# Patient Record
Sex: Male | Born: 1935 | Race: Black or African American | Hispanic: No | Marital: Married | Smoking: Former smoker
Health system: Southern US, Community
[De-identification: ages and names within clinical notes are randomized; demographics above are authoritative.]

## PROBLEM LIST (undated history)

## (undated) DIAGNOSIS — G4733 Obstructive sleep apnea (adult) (pediatric): Secondary | ICD-10-CM

## (undated) DIAGNOSIS — I639 Cerebral infarction, unspecified: Secondary | ICD-10-CM

## (undated) DIAGNOSIS — I10 Essential (primary) hypertension: Secondary | ICD-10-CM

## (undated) DIAGNOSIS — I739 Peripheral vascular disease, unspecified: Secondary | ICD-10-CM

## (undated) DIAGNOSIS — E785 Hyperlipidemia, unspecified: Secondary | ICD-10-CM

## (undated) DIAGNOSIS — C61 Malignant neoplasm of prostate: Secondary | ICD-10-CM

## (undated) DIAGNOSIS — R413 Other amnesia: Secondary | ICD-10-CM

## (undated) HISTORY — DX: Cerebral infarction, unspecified: I63.9

## (undated) HISTORY — DX: Hyperlipidemia, unspecified: E78.5

## (undated) HISTORY — DX: Other amnesia: R41.3

## (undated) HISTORY — DX: Malignant neoplasm of prostate: C61

## (undated) HISTORY — DX: Essential (primary) hypertension: I10

## (undated) HISTORY — PX: CHOLECYSTECTOMY: SHX55

## (undated) HISTORY — DX: Peripheral vascular disease, unspecified: I73.9

---
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
## 1992-02-03 HISTORY — PX: BACK SURGERY: SHX140

## 1998-07-09 ENCOUNTER — Ambulatory Visit (HOSPITAL_COMMUNITY): Admission: RE | Admit: 1998-07-09 | Discharge: 1998-07-09 | Payer: Self-pay | Admitting: Neurology

## 2000-05-20 ENCOUNTER — Encounter: Payer: Self-pay | Admitting: *Deleted

## 2000-05-20 ENCOUNTER — Encounter: Admission: RE | Admit: 2000-05-20 | Discharge: 2000-05-20 | Payer: Self-pay | Admitting: *Deleted

## 2000-05-28 ENCOUNTER — Encounter (HOSPITAL_BASED_OUTPATIENT_CLINIC_OR_DEPARTMENT_OTHER): Payer: Self-pay | Admitting: General Surgery

## 2000-06-01 ENCOUNTER — Ambulatory Visit (HOSPITAL_COMMUNITY): Admission: RE | Admit: 2000-06-01 | Discharge: 2000-06-02 | Payer: Self-pay | Admitting: General Surgery

## 2000-06-01 ENCOUNTER — Encounter (HOSPITAL_BASED_OUTPATIENT_CLINIC_OR_DEPARTMENT_OTHER): Payer: Self-pay | Admitting: General Surgery

## 2000-06-01 ENCOUNTER — Encounter (INDEPENDENT_AMBULATORY_CARE_PROVIDER_SITE_OTHER): Payer: Self-pay | Admitting: *Deleted

## 2003-08-04 ENCOUNTER — Emergency Department (HOSPITAL_COMMUNITY): Admission: EM | Admit: 2003-08-04 | Discharge: 2003-08-04 | Payer: Self-pay | Admitting: Emergency Medicine

## 2003-08-17 ENCOUNTER — Ambulatory Visit (HOSPITAL_COMMUNITY): Admission: RE | Admit: 2003-08-17 | Discharge: 2003-08-17 | Payer: Self-pay | Admitting: Internal Medicine

## 2003-09-04 ENCOUNTER — Ambulatory Visit (HOSPITAL_COMMUNITY): Admission: RE | Admit: 2003-09-04 | Discharge: 2003-09-04 | Payer: Self-pay | Admitting: Internal Medicine

## 2004-02-29 ENCOUNTER — Ambulatory Visit (HOSPITAL_COMMUNITY): Admission: RE | Admit: 2004-02-29 | Discharge: 2004-02-29 | Payer: Self-pay | Admitting: Internal Medicine

## 2004-04-30 ENCOUNTER — Ambulatory Visit (HOSPITAL_COMMUNITY): Admission: RE | Admit: 2004-04-30 | Discharge: 2004-04-30 | Payer: Self-pay | Admitting: Internal Medicine

## 2005-07-01 ENCOUNTER — Ambulatory Visit (HOSPITAL_COMMUNITY): Admission: RE | Admit: 2005-07-01 | Discharge: 2005-07-01 | Payer: Self-pay | Admitting: Internal Medicine

## 2006-01-11 ENCOUNTER — Ambulatory Visit (HOSPITAL_COMMUNITY): Admission: RE | Admit: 2006-01-11 | Discharge: 2006-01-11 | Payer: Self-pay | Admitting: Internal Medicine

## 2006-01-25 ENCOUNTER — Ambulatory Visit (HOSPITAL_COMMUNITY): Admission: RE | Admit: 2006-01-25 | Discharge: 2006-01-25 | Payer: Self-pay | Admitting: Internal Medicine

## 2006-09-01 ENCOUNTER — Inpatient Hospital Stay (HOSPITAL_COMMUNITY): Admission: EM | Admit: 2006-09-01 | Discharge: 2006-09-04 | Payer: Self-pay | Admitting: Emergency Medicine

## 2006-09-03 ENCOUNTER — Ambulatory Visit: Payer: Self-pay | Admitting: Cardiology

## 2006-09-16 ENCOUNTER — Ambulatory Visit (HOSPITAL_COMMUNITY): Admission: RE | Admit: 2006-09-16 | Discharge: 2006-09-16 | Payer: Self-pay | Admitting: Internal Medicine

## 2007-07-14 ENCOUNTER — Ambulatory Visit (HOSPITAL_COMMUNITY): Admission: RE | Admit: 2007-07-14 | Discharge: 2007-07-14 | Payer: Self-pay | Admitting: *Deleted

## 2007-08-17 ENCOUNTER — Encounter (INDEPENDENT_AMBULATORY_CARE_PROVIDER_SITE_OTHER): Payer: Self-pay | Admitting: Neurology

## 2007-08-17 ENCOUNTER — Ambulatory Visit: Payer: Self-pay

## 2008-04-22 ENCOUNTER — Emergency Department (HOSPITAL_COMMUNITY): Admission: EM | Admit: 2008-04-22 | Discharge: 2008-04-22 | Payer: Self-pay | Admitting: Emergency Medicine

## 2008-12-27 ENCOUNTER — Emergency Department (HOSPITAL_COMMUNITY): Admission: EM | Admit: 2008-12-27 | Discharge: 2008-12-27 | Payer: Self-pay | Admitting: Emergency Medicine

## 2009-06-12 ENCOUNTER — Emergency Department (HOSPITAL_COMMUNITY): Admission: EM | Admit: 2009-06-12 | Discharge: 2009-06-12 | Payer: Self-pay | Admitting: Emergency Medicine

## 2009-07-01 ENCOUNTER — Emergency Department (HOSPITAL_COMMUNITY): Admission: EM | Admit: 2009-07-01 | Discharge: 2009-07-01 | Payer: Self-pay | Admitting: Emergency Medicine

## 2010-04-01 ENCOUNTER — Encounter: Payer: Self-pay | Admitting: Cardiovascular Disease

## 2010-04-01 DIAGNOSIS — I739 Peripheral vascular disease, unspecified: Secondary | ICD-10-CM | POA: Insufficient documentation

## 2010-04-02 ENCOUNTER — Encounter: Payer: Self-pay | Admitting: Cardiovascular Disease

## 2010-04-02 ENCOUNTER — Encounter (INDEPENDENT_AMBULATORY_CARE_PROVIDER_SITE_OTHER): Payer: Medicare Other

## 2010-04-02 ENCOUNTER — Ambulatory Visit (INDEPENDENT_AMBULATORY_CARE_PROVIDER_SITE_OTHER): Payer: Medicare Other | Admitting: Cardiovascular Disease

## 2010-04-02 DIAGNOSIS — I70219 Atherosclerosis of native arteries of extremities with intermittent claudication, unspecified extremity: Secondary | ICD-10-CM

## 2010-04-02 DIAGNOSIS — I739 Peripheral vascular disease, unspecified: Secondary | ICD-10-CM

## 2010-04-03 ENCOUNTER — Encounter: Payer: Self-pay | Admitting: Cardiovascular Disease

## 2010-04-10 NOTE — Miscellaneous (Signed)
Summary: Orders Update  Clinical Lists Changes  Problems: Added new problem of UNSPECIFIED PERIPHERAL VASCULAR DISEASE (ICD-443.9) Orders: Added new Test order of Arterial Duplex Lower Extremity (Arterial Duplex Low) - Signed 

## 2010-04-14 DIAGNOSIS — I70219 Atherosclerosis of native arteries of extremities with intermittent claudication, unspecified extremity: Secondary | ICD-10-CM | POA: Insufficient documentation

## 2010-04-22 NOTE — Assessment & Plan Note (Signed)
Summary: New PV Consult/abnormal LEA   Visit Type:  Initial Consult  CC:  New PV evaluation per Dr. Leeanne Deed- .  History of Present Illness: This is a 75 year old gentleman referred for initial evaluation of lower extremity peripheral arterial disease. The patient sees Dr. Golden Pop for problems with his toenails, and he was noted to have an abnormal pulse exam. He was referred for ABIs and lower extremity duplex scanning performed earlier today. This demonstrated an ABI of 1.2 on the right and 0.86 on the left. TBI's were 0.95 on the right and 0.69 on the left. Duplex imaging demonstrated short segment occlusion of the left mid SFA.  The patient reports that his left leg "gives out." He describes a mild ache in his left knee and calf that resolves with rest. He is able to do a good bit of walking and really does not complain of much limitation. He denies rest pain or ulceration. He has no pain in his right leg. His left leg symptoms are long-standing and nonprogressive.  Current Medications (verified): 1)  Lisinopril 10 Mg Tabs (Lisinopril) .... Take One Tablet By Mouth Daily 2)  Simvastatin 40 Mg Tabs (Simvastatin) .... Take One Tablet By Mouth Daily At Bedtime 3)  Hydrocodone-Acetaminophen 5-500 Mg Tabs (Hydrocodone-Acetaminophen) .... As Needed 4)  Centrum Silver  Tabs (Multiple Vitamins-Minerals) .... Take 1 Tablet By Mouth Once A Day  Allergies (verified): No Known Drug Allergies  Past History:  Past medical, surgical, family and social histories (including risk factors) reviewed, and no changes noted (except as noted below).  Past Medical History: Hypertension Hyperlipidemia  Past Surgical History: Cervical spine surgery 1994 after a fall Cholecystecomy - remote.  Family History: Reviewed history and no changes required. CAD - Father, Mother, Brother - all deceased, no premature CAD Brother had a 'blood clot' in leg  No aneurysmal disease  Social History: Reviewed history  and no changes required. 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 No Etoh  Review of Systems       Negative except as per HPI   Vital Signs:  Patient profile:   75 year old male Height:      72 inches Weight:      173.75 pounds BMI:     23.65 Pulse rate:   60 / minute Pulse rhythm:   regular Resp:     18 per minute BP sitting:   114 / 84  (left arm) Cuff size:   large  Vitals Entered By: Vikki Ports (April 02, 2010 10:11 AM)  Serial Vital Signs/Assessments:  Time      Position  BP       Pulse  Resp  Temp     By           R Arm     120/80                         Vikki Ports   Physical Exam  General:  Pt is well-developed, alert and oriented, African American male in no acute distress HEENT: normal Neck: no thyromegaly           JVP normal, carotid upstrokes normal without bruits Lungs: CTA Chest: equal expansion  CV: Apical impulse nondisplaced, RRR without murmur or gallop Abd: soft, NT, positive BS, no HSM, no bruit Back: no CVA tenderness Ext: no clubbing, cyanosis, or edema        femoral pulses 2+  without bruits        posterior tibial pulses are nonpalpable bilaterally        Dorsalis pedis pulses 1+ bilaterally Skin: warm, dry, no rash Neuro: CNII-XII intact,strength 5/5 = b/l    Impression & Recommendations:  Problem # 1:  ATHEROSCLEROSIS W /INT CLAUDICATION (ICD-440.21) This is a 75 year old gentleman with mild, lifestyle limiting intermittent claudication. His symptoms are likely secondary to total occlusion of the left SFA with collaterals. His duplex scan raised the question of total occlusion versus a "string sign." I suspect the patient has total left SFA occlusion as his symptoms appear to be in a very stable pattern. With only a mildly reduced ABI and adequate toe brachial indices, I have recommended conservative therapy with risk reduction measures. We discussed antiplatelet therapy but the patient is  unable to take this as he has had severe epistaxis in the past with taking aspirin. He will continue on lisinopril and simvastatin. I would be happy to see him in the future if his leg symptoms worsen.  I appreciate the opportunity to evaluate this nice gentleman.  Patient Instructions: 1)  Your physician recommends that you schedule a follow-up appointment as needed.  2)  Your physician recommends that you continue on your current medications as directed. Please refer to the Current Medication list given to you today.

## 2010-05-15 LAB — DIFFERENTIAL
Basophils Absolute: 0.1 10*3/uL (ref 0.0–0.1)
Lymphocytes Relative: 17 % (ref 12–46)
Monocytes Absolute: 0.8 10*3/uL (ref 0.1–1.0)
Neutro Abs: 7.2 10*3/uL (ref 1.7–7.7)

## 2010-05-15 LAB — COMPREHENSIVE METABOLIC PANEL
ALT: 25 U/L (ref 0–53)
Calcium: 8.8 mg/dL (ref 8.4–10.5)
Creatinine, Ser: 0.85 mg/dL (ref 0.4–1.5)
GFR calc Af Amer: >60 mL/min (ref >60)
GFR calc non Af Amer: >60 mL/min (ref >60)
Glucose, Bld: 155 mg/dL — ABNORMAL HIGH (ref 70–99)
Total Protein: 6.6 g/dL (ref 6.0–8.3)

## 2010-05-15 LAB — CBC
HCT: 39.6 % (ref 39.0–52.0)
Hemoglobin: 13 g/dL (ref 13.0–17.0)
MCHC: 33 g/dL (ref 30.0–36.0)
Platelets: 203 10*3/uL (ref 150–400)
RDW: 14.1 % (ref 11.5–15.5)

## 2010-05-20 ENCOUNTER — Encounter: Payer: Self-pay | Admitting: Cardiovascular Disease

## 2010-05-21 ENCOUNTER — Ambulatory Visit (INDEPENDENT_AMBULATORY_CARE_PROVIDER_SITE_OTHER): Payer: Medicare Other | Admitting: Cardiovascular Disease

## 2010-05-21 ENCOUNTER — Encounter: Payer: Self-pay | Admitting: Cardiovascular Disease

## 2010-05-21 VITALS — BP 126/80 | HR 68 | Resp 18 | Ht 72.0 in | Wt 179.8 lb

## 2010-05-21 DIAGNOSIS — I70219 Atherosclerosis of native arteries of extremities with intermittent claudication, unspecified extremity: Secondary | ICD-10-CM

## 2010-05-21 NOTE — Patient Instructions (Signed)
Your physician wants you to follow-up in: 1 YEAR. You will receive a reminder letter in the mail two months in advance. If you don't receive a letter, please call our office to schedule the follow-up appointment.  Your physician has requested that you have an ankle brachial index (ABI) in 1 YEAR. During this test an ultrasound and blood pressure cuff are used to evaluate the arteries that supply the arms and legs with blood. Allow thirty minutes for this exam. There are no restrictions or special instructions.  Your physician recommends that you continue on your current medications as directed. Please refer to the Current Medication list given to you today.  

## 2010-05-21 NOTE — Progress Notes (Signed)
HPI:  Ricky [name] is a 75 year old gentleman returning for followup evaluation. He was recently evaluated for lower extremity peripheral arterial disease. The patient underwent a duplex scan and ABI evaluation because of an abnormal pulse exam. His ABIs were 1.2 on the right is 0.86 on the left. He was noted to have short segment occlusion of the left SFA with collaterals. Medical therapy was recommended.  The patient returns today because of concerns of her left leg weakness. He describes a "weakness" in his left knee. At times the knee will give out when the patient walks. This is not consistent and only occurs on an intermittent basis. Most days he is able to walk for long distances without problems. He specifically denies calf pain or tightness with walking. He denies ulcers or nonhealing wounds in the feet. He has no other complaints today.  Outpatient Encounter Prescriptions as of 05/21/2010  Medication Sig Dispense Refill  . lisinopril (PRINIVIL,ZESTRIL) 10 MG tablet Take 10 mg by mouth daily.        . Multiple Vitamin (MULTIVITAMIN) tablet Take 1 tablet by mouth daily.        . simvastatin (ZOCOR) 40 MG tablet Take 40 mg by mouth at bedtime.        Marland Kitchen HYDROcodone-acetaminophen (VICODIN) 5-500 MG per tablet Take 1 tablet by mouth every 6 (six) hours as needed.          No Known Allergies  Past Medical History  Diagnosis Date  . HTN (hypertension)   . HLD (hyperlipidemia)     ROS: Negative except as per HPI  BP 126/80  Pulse 68  Resp 18  Ht 6' (1.829 m)  Wt 179 lb 12.8 oz (81.557 kg)  BMI 24.39 kg/m2  PHYSICAL EXAM: Pt is alert and oriented, NAD HEENT: normal Neck: JVP - normal, carotids 2+= without bruits Lungs: CTA bilaterally CV: RRR without murmur or gallop Abd: soft, NT, Positive BS, no hepatomegaly Ext: no C/C/E, right DP pulses 2+, left DP pulses 1+. Posterior tibials are not palpable. Skin: warm/dry no rash  ASSESSMENT AND PLAN:

## 2010-05-21 NOTE — Assessment & Plan Note (Signed)
I reviewed the significance of PAD with the patient in detail. He has only mild impairment in arterial flow to the left leg based on an ABI of 86%. He has a palpable pulse in the left foot and really does not have typical symptoms of claudication. I think his left knee weakness is related to an orthopedic problem most likely. He is on appropriate medical therapy with the exception of no antiplatelet drug. He has not tolerated aspirin in the past because of epistaxis. He is willing to try every other day low dose aspirin and if he tolerates this increased to once daily. Otherwise he will continue on his current medical program and will followup in one year with repeat ABIs at that time.

## 2010-06-20 NOTE — Discharge Summary (Signed)
NAME:  Ricky [name], Ricky [name] NO.:  [id number]   MEDICAL RECORD NO.:  [id number]          PATIENT TYPE:  INP   LOCATION:  4742                         FACILITY:  [facility]   PHYSICIAN:  Margaretmary Bayley, M.D.    DATE OF BIRTH:  1935-04-09   DATE OF ADMISSION:  09/01/2006  DATE OF DISCHARGE:  09/04/2006                               DISCHARGE SUMMARY   DISCHARGE DIAGNOSES:  1. Syncope and collapse.  2. Hypotension secondary to recent diuresis.  3. Systemic hypertension.  4. Pure hypercholesterolemia.  5. Chronic cigarette abuse.  6. Chronic obstructive pulmonary disease.  7. Chronic bronchitis.  8. No evidence of acute exacerbation.   REASON FOR ADMISSION:  Ricky [name] is a 75 year old former 30-pack-year  cigarette smoker hypertensive gentleman who was admitted after  sustaining a syncopal episode.  The patient apparently was at  work and  apparently experienced 2-3 loose bowel movements, became dizzy, and fell  during the process of losing his sensorium.  Coworkers do not describe  any seizure-like activity and no incontinence.  He was found by the ER  staff and EMS to have a blood pressure of 82/50 and a pulse rate of 122  when initially evaluated.  He was started on IV fluid hydration  aggressively on his way to the emergency room and after having been  continued, he was admitted to the ER, and with these measures his  systolic blood pressure increased to about 95 with a pulse rate dropping  to 64.  There apparently was no associated chest pain or shortness of  breath.  Of importance in the patient's history is that he is on chronic  Prinivil 40 mg per day for his blood pressure along with Vytorin and on  a recent office visit to his cardiologist, he was apparently told to  increase his furosemide from 40 to 80 mg per day because at that time  his blood pressure was still slightly high.  The patient states that  over the two days subsequent to this change he had felt a  little bit  weak but had no dizziness and particularly denies any faintness or  wooziness in going from a lying or sitting position to a standing one.  There was no history of any chest pain either.   PERTINENT PHYSICAL FINDINGS:  VITAL SIGNS:  After having been bolused  with about a liter of IV fluids in the ER, his blood pressure was back  up to 110/70 lying, 105/68 sitting with his legs dangling, and a pulse  rate of 55 and 64 sitting with his legs dangling.  HEENT:  There was no obvious head trauma.  SKIN:  Warm and dry.  NECK:  Supple with no adenopathy, thyromegaly, or jugular venous  distention, and no carotid bruits.  LUNGS: There was no splinting, tenderness, or deformities.  His lungs  were clear.  CARDIAC EXAM:  He had a regular rhythm with a rate of about  50-64.  No gallops or rubs were noted.  ABDOMEN:  Soft, nontender; no organomegaly; no masses.   PERTINENT LAB DATA:  His white count is 9600 with a hemoglobin of 13 and  hematocrit of 39.  His electrolytes were all normal.  Glucose of 155 and  115.  Creatinine of 1.5.  BUN 18.  Initial CPK was 135 with troponin of  0.01 and his urine was completely clear.  His EKG revealed sinus rhythm  and changes that were suggestive of an old anteroseptal MI.  He had no  acute repolarization abnormalities.   HOSPITAL COURSE:  The patient was admitted with a working diagnosis of  syncope, hypotension possibly related to diarrhea and a recent increase  in his diuretic therapy.  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  His Prinivil was discontinued.  His  diuretics discontinued.  He continued to receive parenteral IV fluids  and half-normal saline at 100-120 mL/hr and his orthostatic changes in  blood pressure and pulse subsequently resolved over the first 24 hours.  Subsequent stools for occult blood and serial CBCs revealed  no evidence  suggesting significant blood loss or drop in his hemogram.  He developed  chest pain and serial enzymes and EKGs were all negative for an acute  myocardial infarction.  A cortisol level was 13.5.  He had normal  thyroid function studies.  Serial EKGs revealed no evolutionary changes  to suggest an ongoing acute cardiopulmonary event.  An MRI of his brain  revealed a chronic right middle cerebral artery infarct however there  were no changes to suggest that this was an acute event.  Chest x-ray  was completely clear; there was no evidence of cardiomegaly, and a 2-D  echocardiogram revealed no evidence of valvular heart disease and  specifically no evidence of aortic stenosis.   The patient's activity was progressively and gradually advanced with the  assistance of the rehab service and physical therapy.  The patient was  discharged home after being instructed on stopping his Prinivil and  furosemide.  He was advised to resume his aspirin, Vytorin, and the use  of Vicodin p.r.n. for his lower back pain.  The patient is scheduled to  be seen at [facility] in 1 week and he will be seen back in my  office in 2 weeks.  He was discharged home on a 3-gm sodium modified fat-  restricted diet on Vytorin 10/40 once daily, Ecotrin 81 mg per day, and  Vicodin p.r.n. back pain.  The patient was advised not to discard his  Prinivil or his furosemide as they probably be needed  at a lower dose.      Margaretmary Bayley, M.D.  Electronically Signed     PC/MEDQ  D:  10/07/2006  T:  10/07/2006  Job:  [id number]

## 2010-06-20 NOTE — Op Note (Signed)
[name]  Patient:    Ricky, [name]                           MRN: [id number] Proc. Date: 06/01/00 Adm. Date:  [id number] Attending:  Fortino Sic CC:         Heather Roberts, M.D.   Operative Report  PREOPERATIVE DIAGNOSIS:  Symptomatic cholelithiasis.  POSTOPERATIVE DIAGNOSIS:  Symptomatic cholelithiasis.  OPERATION PERFORMED:  Laparoscopic cholecystectomy with intraoperative cholangiogram.  SURGEON:  Marnee Spring. Wiliam Ke, M.D.  Threasa HeadsMarland Kitchen  Velna Hatchet.  ANESTHESIA:  Endotracheal by hospital.  DESCRIPTION OF PROCEDURE:  Under good endotracheal anesthesia, the skin of the abdomen was prepped and draped in the usual manner.  Tissue was infiltrated with Marcaine anesthesia prior to incision.  A curvilinear incision was made above the umbilicus.  The abdomen was entered.  A pursestring suture was placed.  Hasson cannula was inserted.  Pneumoperitoneum was obtained.  The peritoneal surfaces appeared normal.  A 10 mm and two 5 mm cannulas were placed in the usual fashion.  Dissecting near the porta hepatis, the cystic duct gallbladder junction was identified.  The cystic duct was opened, Reddick catheter was inserted and cholangiogram was performed using a fluoroscopic technique.  This was normal.  The cystic duct was triply clipped and cut leaving two clips proximally  The cystic artery was triply clipped and cut leaving two clips proximally.  The gallbladder was removed from the liver bed with an electrocautery current and hemostasis was obtained with electrocautery current.  The gallbladder was removed in an Endo pouch via the umbilical port. The right upper quadrant was copiously irrigated with saline and sucked dry. The cannula was removed.  Pursestring suture was tied.  The skin wounds were closed with subcuticular 4-0 Dexon and Steri-Strips were applied.  Estimated blood loss for this procedure was minimal.  The patient received no blood and left  the operating room in satisfactory condition after sponge and needle counts were verified. DD:  06/01/00 TD:  06/01/00 Job: [id number]

## 2010-11-17 LAB — BASIC METABOLIC PANEL
CO2: 24
Chloride: 109
Glucose, Bld: 104 — ABNORMAL HIGH
Potassium: 4.2
Sodium: 139

## 2010-11-17 LAB — COMPREHENSIVE METABOLIC PANEL
ALT: 12
ALT: 17
AST: 19
AST: 20
Albumin: 3.5
Alkaline Phosphatase: 72
Alkaline Phosphatase: 73
BUN: 15
CO2: 25
Calcium: 9.2
Chloride: 108
Chloride: 109
GFR calc Af Amer: 56 — ABNORMAL LOW
GFR calc Af Amer: >60
GFR calc non Af Amer: 46 — ABNORMAL LOW
Glucose, Bld: 115 — ABNORMAL HIGH
Potassium: 4.1
Sodium: 138
Sodium: 139
Total Bilirubin: 0.5
Total Bilirubin: 0.6
Total Protein: 6.4

## 2010-11-17 LAB — DIFFERENTIAL
Eosinophils Relative: 0
Lymphocytes Relative: 13
Lymphs Abs: 1.7
Monocytes Absolute: 0.6
Monocytes Relative: 5

## 2010-11-17 LAB — CBC
HCT: 43.4
Hemoglobin: 13.1
Hemoglobin: 14.4
MCHC: 33.3
MCV: 84
Platelets: 177
RBC: 4.62
RBC: 5.12
RDW: 15.1 — ABNORMAL HIGH
RDW: 15.2 — ABNORMAL HIGH
WBC: 7.4
WBC: 12.6 — ABNORMAL HIGH
WBC: 9.6

## 2010-11-17 LAB — URINALYSIS, MICROSCOPIC ONLY
Nitrite: NEGATIVE
Protein, ur: NEGATIVE
Specific Gravity, Urine: 1.018
Urobilinogen, UA: 0.2

## 2010-11-17 LAB — POCT I-STAT CREATININE
Creatinine, Ser: 2.7 — ABNORMAL HIGH
Operator id: 277751

## 2010-11-17 LAB — I-STAT 8, (EC8 V) (CONVERTED LAB)
BUN: 21
Chloride: 111
Glucose, Bld: 155 — ABNORMAL HIGH
pCO2, Ven: 42.8 — ABNORMAL LOW
pH, Ven: 7.351 — ABNORMAL HIGH

## 2010-11-17 LAB — CARDIAC PANEL(CRET KIN+CKTOT+MB+TROPI)
CK, MB: 3.7
CK, MB: 3.7
Relative Index: 2.9 — ABNORMAL HIGH

## 2010-11-17 LAB — CLOSTRIDIUM DIFFICILE EIA

## 2010-11-17 LAB — TSH: TSH: 4.153

## 2010-11-17 LAB — CORTISOL: Cortisol, Plasma: 13.5

## 2010-11-17 LAB — OCCULT BLOOD X 1 CARD TO LAB, STOOL: Fecal Occult Bld: NEGATIVE

## 2010-12-10 ENCOUNTER — Emergency Department (HOSPITAL_COMMUNITY): Payer: Medicare Other

## 2010-12-10 ENCOUNTER — Emergency Department (HOSPITAL_COMMUNITY)
Admission: EM | Admit: 2010-12-10 | Discharge: 2010-12-10 | Disposition: A | Discharge status: Home / Self Care | Payer: Medicare Other | Attending: Emergency Medicine | Admitting: Emergency Medicine

## 2010-12-10 ENCOUNTER — Encounter (HOSPITAL_COMMUNITY): Payer: Self-pay | Admitting: *Deleted

## 2010-12-10 DIAGNOSIS — R109 Unspecified abdominal pain: Secondary | ICD-10-CM | POA: Insufficient documentation

## 2010-12-10 DIAGNOSIS — R059 Cough, unspecified: Secondary | ICD-10-CM | POA: Insufficient documentation

## 2010-12-10 DIAGNOSIS — R05 Cough: Secondary | ICD-10-CM | POA: Insufficient documentation

## 2010-12-10 DIAGNOSIS — R509 Fever, unspecified: Secondary | ICD-10-CM | POA: Insufficient documentation

## 2010-12-10 DIAGNOSIS — N419 Inflammatory disease of prostate, unspecified: Secondary | ICD-10-CM

## 2010-12-10 DIAGNOSIS — I10 Essential (primary) hypertension: Secondary | ICD-10-CM | POA: Insufficient documentation

## 2010-12-10 DIAGNOSIS — Z8546 Personal history of malignant neoplasm of prostate: Secondary | ICD-10-CM | POA: Insufficient documentation

## 2010-12-10 LAB — BASIC METABOLIC PANEL
Chloride: 99 mEq/L (ref 96–112)
GFR calc Af Amer: 61 mL/min — ABNORMAL LOW (ref >90)
GFR calc non Af Amer: 52 mL/min — ABNORMAL LOW (ref >90)
Potassium: 3.4 mEq/L — ABNORMAL LOW (ref 3.5–5.1)
Sodium: 136 mEq/L (ref 135–145)

## 2010-12-10 LAB — DIFFERENTIAL
Basophils Relative: 1 % (ref 0–1)
Eosinophils Absolute: 0.1 10*3/uL (ref 0.0–0.7)
Eosinophils Relative: 2 % (ref 0–5)
Lymphs Abs: 1.2 10*3/uL (ref 0.7–4.0)
Neutrophils Relative %: 73 % (ref 43–77)

## 2010-12-10 LAB — CBC
MCH: 28 pg (ref 26.0–34.0)
MCHC: 35.9 g/dL (ref 30.0–36.0)
MCV: 78.8 fL (ref 78.0–100.0)
Platelets: 126 10*3/uL — ABNORMAL LOW (ref 150–400)
RBC: 4.85 MIL/uL (ref 4.22–5.81)

## 2010-12-10 LAB — URINE MICROSCOPIC-ADD ON

## 2010-12-10 LAB — URINALYSIS, ROUTINE W REFLEX MICROSCOPIC
Glucose, UA: 250 mg/dL — AB
Hgb urine dipstick: NEGATIVE
Leukocytes, UA: NEGATIVE
Protein, ur: 100 mg/dL — AB
Urobilinogen, UA: 1 mg/dL (ref 0.0–1.0)

## 2010-12-10 MED ORDER — HYDROCODONE-ACETAMINOPHEN 5-325 MG PO TABS: 2.0000 | ORAL_TABLET | ORAL | Status: AC | PRN

## 2010-12-10 MED ORDER — CIPROFLOXACIN HCL 500 MG PO TABS
500.0000 mg | ORAL_TABLET | Freq: Once | ORAL | Status: AC
  Administered 2010-12-10: 500 mg via ORAL
  Filled 2010-12-10: qty 1

## 2010-12-10 MED ORDER — CIPROFLOXACIN HCL 500 MG PO TABS: 500.0000 mg | ORAL_TABLET | Freq: Two times a day (BID) | ORAL | Status: AC

## 2010-12-10 NOTE — ED Provider Notes (Signed)
History     CSN: [id number] Arrival date & time: 12/10/2010  1:12 PM   First MD Initiated Contact with Patient 12/10/10 1627      Chief Complaint  Patient presents with  . Abdominal Pain    fever 101.5, sent from Dr. Chestine Spore for EDP to see     HPI  Patient presents to emergency room with 3 four-day history of chills and lower dominant all discomfort.  Discomfort is worse when he coughs.  He denies productive cough or difficulty breathing.  He denies vomiting, diarrhea.  Patient went to his primary medical doctor Ray was found to have a temperature of 101.  Placenta the ED for evaluation.  Patient has known prostate problems.  Patient denies loss of appetite.  Patient denies recent weight loss.  Past Medical History  Diagnosis Date  . HTN (hypertension)   . HLD (hyperlipidemia)   . Cancer     prostate    Past Surgical History  Procedure Date  . Cholecystectomy   . Back surgery 1994    cervical    Family History  Problem Relation Age of Onset  . Coronary artery disease      History  Substance Use Topics  . Smoking status: Former Smoker    Quit date: 02/03/1992  . Smokeless tobacco: Not on file  . Alcohol Use: No      Review of Systems  Constitutional: Positive for fever.  Respiratory: Positive for cough.   Gastrointestinal: Negative for abdominal distention.  Genitourinary: Negative for urgency.  All other systems reviewed and are negative.    Allergies  Review of patient's allergies indicates no known allergies.  Home Medications   Current Outpatient Rx  Name Route Sig Dispense Refill  . HYDROCODONE-ACETAMINOPHEN 5-500 MG PO TABS Oral Take 1 tablet by mouth every 6 (six) hours as needed.      Marland Kitchen LISINOPRIL 10 MG PO TABS Oral Take 10 mg by mouth daily.      Marland Kitchen ONE-DAILY MULTI VITAMINS PO TABS Oral Take 1 tablet by mouth daily.      Marland Kitchen SIMVASTATIN 40 MG PO TABS Oral Take 40 mg by mouth at bedtime.        BP 102/64  Pulse 120  Temp(Src) 99.4 F (37.4 C)  (Oral)  Resp 18  Wt 194 lb (87.998 kg)  SpO2 94%  Physical Exam  Constitutional: He appears well-developed and well-nourished. No distress.  HENT:  Head: Normocephalic and atraumatic.  Cardiovascular: Normal rate.   Abdominal: Soft. There is no tenderness. There is no rebound and no guarding.  Genitourinary: Rectal exam shows anal tone normal. Prostate is not enlarged and not tender.  Musculoskeletal: Normal range of motion.  Skin: Skin is warm.  Psychiatric: He has a normal mood and affect.    ED Course  Procedures (including critical care time)   Labs Reviewed  CBC - Abnormal; Notable for the following:    Hemoglobin 12.5 (*)    HCT 38.2 (*)    Platelets 126 (*)    All other components within normal limits  URINALYSIS, ROUTINE W REFLEX MICROSCOPIC - Abnormal; Notable for the following:    Color, Urine ORANGE (*) BIOCHEMICALS MAY BE AFFECTED BY COLOR   Appearance CLOUDY (*)    Specific Gravity, Urine 1.038 (*)    Glucose, UA 250 (*)    Bilirubin Urine SMALL (*)    Ketones, ur TRACE (*)    Protein, ur 100 (*)    All other components within  normal limits  BASIC METABOLIC PANEL - Abnormal; Notable for the following:    Potassium 3.4 (*)    Glucose, Bld 140 (*)    GFR calc non Af Amer 52 (*)    GFR calc Af Amer 61 (*)    All other components within normal limits  URINE MICROSCOPIC-ADD ON - Abnormal; Notable for the following:    Bacteria, UA FEW (*)    All other components within normal limits  DIFFERENTIAL  URINE CULTURE     Labs Reviewed  CBC  DIFFERENTIAL  URINALYSIS, ROUTINE W REFLEX MICROSCOPIC  URINE CULTURE  BASIC METABOLIC PANEL   No results found.   No diagnosis found.    MDM          Nelia Shi, MD 12/10/10 2020

## 2010-12-10 NOTE — ED Notes (Signed)
Pt went to Dr. Ophelia Charter office today & was sent here; pt states "when I cough & take a deep breath my stomach hurts worse"

## 2010-12-10 NOTE — ED Notes (Signed)
Pt sitting up in chair; states abd pain is some better; c/o neck pain from old injury; feels chilled; requesting to be discharged; dressed and ready to go

## 2010-12-10 NOTE — ED Notes (Signed)
[id number]<QI> Expected date:12/10/10<BR> Expected time: 4:04 PM<BR> Means of arrival:Ambulance<BR> Comments:<BR> EMS 61 GC - fatigue

## 2010-12-11 LAB — URINE CULTURE
Colony Count: NO GROWTH
Culture: NO GROWTH

## 2010-12-15 ENCOUNTER — Encounter (HOSPITAL_COMMUNITY): Payer: Self-pay

## 2010-12-15 ENCOUNTER — Emergency Department (HOSPITAL_COMMUNITY)
Admission: EM | Admit: 2010-12-15 | Discharge: 2010-12-15 | Disposition: A | Discharge status: Home / Self Care | Payer: Medicare Other | Attending: Emergency Medicine | Admitting: Emergency Medicine

## 2010-12-15 DIAGNOSIS — R109 Unspecified abdominal pain: Secondary | ICD-10-CM | POA: Insufficient documentation

## 2010-12-15 DIAGNOSIS — N289 Disorder of kidney and ureter, unspecified: Secondary | ICD-10-CM | POA: Insufficient documentation

## 2010-12-15 DIAGNOSIS — R319 Hematuria, unspecified: Secondary | ICD-10-CM | POA: Insufficient documentation

## 2010-12-15 DIAGNOSIS — R350 Frequency of micturition: Secondary | ICD-10-CM | POA: Insufficient documentation

## 2010-12-15 DIAGNOSIS — R300 Dysuria: Secondary | ICD-10-CM | POA: Insufficient documentation

## 2010-12-15 DIAGNOSIS — Z79899 Other long term (current) drug therapy: Secondary | ICD-10-CM | POA: Insufficient documentation

## 2010-12-15 DIAGNOSIS — N390 Urinary tract infection, site not specified: Secondary | ICD-10-CM

## 2010-12-15 DIAGNOSIS — Z8546 Personal history of malignant neoplasm of prostate: Secondary | ICD-10-CM | POA: Insufficient documentation

## 2010-12-15 DIAGNOSIS — E785 Hyperlipidemia, unspecified: Secondary | ICD-10-CM | POA: Insufficient documentation

## 2010-12-15 DIAGNOSIS — I10 Essential (primary) hypertension: Secondary | ICD-10-CM | POA: Insufficient documentation

## 2010-12-15 LAB — CBC
HCT: 35.5 % — ABNORMAL LOW (ref 39.0–52.0)
MCH: 28.4 pg (ref 26.0–34.0)
MCHC: 36.9 g/dL — ABNORMAL HIGH (ref 30.0–36.0)
MCV: 77 fL — ABNORMAL LOW (ref 78.0–100.0)
RDW: 13.8 % (ref 11.5–15.5)

## 2010-12-15 LAB — URINALYSIS, ROUTINE W REFLEX MICROSCOPIC
Glucose, UA: NEGATIVE mg/dL
Leukocytes, UA: NEGATIVE
Protein, ur: 100 mg/dL — AB
pH: 5 (ref 5.0–8.0)

## 2010-12-15 LAB — URINE MICROSCOPIC-ADD ON

## 2010-12-15 LAB — POCT I-STAT, CHEM 8
BUN: 28 mg/dL — ABNORMAL HIGH (ref 6–23)
Calcium, Ion: 1.06 mmol/L — ABNORMAL LOW (ref 1.12–1.32)
Chloride: 100 mEq/L (ref 96–112)
Glucose, Bld: 194 mg/dL — ABNORMAL HIGH (ref 70–99)
HCT: 38 % — ABNORMAL LOW (ref 39.0–52.0)

## 2010-12-15 MED ORDER — CEPHALEXIN 500 MG PO CAPS: 500.0000 mg | ORAL_CAPSULE | Freq: Four times a day (QID) | ORAL | Status: AC

## 2010-12-15 MED ORDER — CEPHALEXIN 250 MG PO CAPS
500.0000 mg | ORAL_CAPSULE | Freq: Once | ORAL | Status: AC
  Administered 2010-12-15: 500 mg via ORAL
  Filled 2010-12-15: qty 2

## 2010-12-15 NOTE — ED Notes (Signed)
Patient presents with dysuria, hematuria and frequency x 1 year. Patient seen for same on Friday and has not followed up at PCP office.

## 2010-12-15 NOTE — ED Provider Notes (Signed)
History     CSN: [id number] Arrival date & time: 12/15/2010  8:19 AM   First MD Initiated Contact with Patient 12/15/10 [phone]      Chief Complaint  Patient presents with  . Dysuria  HPI Patient seen and evaluated at 8:40 am. Patient reports having dysuria, hematuria, polyuria for the past week. Patient's past medical history significant for prostate cancer. Reports he is followed by a doctor Nissen. Patient primary care physician is Dr. Margaretmary Bayley. Denies having seen him in the hospice after his ER visit one week ago. No growth on urine culture at that time.  Past Medical History  Diagnosis Date  . HTN (hypertension)   . HLD (hyperlipidemia)   . Cancer     prostate    Past Surgical History  Procedure Date  . Cholecystectomy   . Back surgery 1994    cervical    Family History  Problem Relation Age of Onset  . Coronary artery disease      History  Substance Use Topics  . Smoking status: Former Smoker    Quit date: 02/03/1992  . Smokeless tobacco: Not on file  . Alcohol Use: No      Review of Systems  Constitutional: Negative for fever, chills, diaphoresis and appetite change.  HENT: Negative for neck pain.   Eyes: Negative for photophobia and visual disturbance.  Respiratory: Negative for cough, chest tightness and shortness of breath.   Cardiovascular: Negative for chest pain.  Gastrointestinal: Negative for nausea, vomiting and abdominal pain.  Genitourinary: Positive for dysuria, frequency, hematuria and flank pain. Negative for decreased urine volume, discharge, penile pain and testicular pain.  Musculoskeletal: Negative for back pain.  Skin: Negative for rash.  Neurological: Negative for weakness and numbness.  All other systems reviewed and are negative.    Allergies  Review of patient's allergies indicates no known allergies.  Home Medications   Current Outpatient Rx  Name Route Sig Dispense Refill  . LISINOPRIL 10 MG PO TABS Oral Take 10 mg by  mouth daily.      Marland Kitchen ONE-DAILY MULTI VITAMINS PO TABS Oral Take 1 tablet by mouth daily.      Marland Kitchen SIMVASTATIN 40 MG PO TABS Oral Take 40 mg by mouth 2 (two) times daily.     Marland Kitchen CIPROFLOXACIN HCL 500 MG PO TABS Oral Take 1 tablet (500 mg total) by mouth every 12 (twelve) hours. 10 tablet 0  . HYDROCODONE-ACETAMINOPHEN 5-325 MG PO TABS Oral Take 2 tablets by mouth every 4 (four) hours as needed for pain. 6 tablet 0    BP 126/70  Pulse 96  Temp(Src) 97.7 F (36.5 C) (Oral)  Resp 14  SpO2 100%  Physical Exam  Nursing note and vitals reviewed. Constitutional: He appears well-developed and well-nourished.  HENT:  Head: Normocephalic and atraumatic.  Eyes: EOM are normal. Pupils are equal, round, and reactive to light.  Neck: Normal range of motion. Neck supple.  Cardiovascular: Normal rate, regular rhythm, normal heart sounds and intact distal pulses.  Exam reveals no gallop and no friction rub.   No murmur heard. Abdominal: Soft. Bowel sounds are normal. He exhibits no mass. There is no tenderness. There is no rebound and no guarding.  Genitourinary:       Right flank pain  Musculoskeletal: Normal range of motion.  Skin: Skin is warm and dry.  Psychiatric: He has a normal mood and affect. His behavior is normal. Judgment and thought content normal.    ED Course  Procedures (including critical care time)  Patient seen and evaluated.  VSS reviewed. . Nursing notes reviewed. Discussed with and seen with Dr. Golda Acre, attending physician. Denied that any imaging is needed at this time.  Initial testing ordered. Will monitor the patient closely. They agree with the treatment plan and diagnosis.   10:24 AM Patient seen and re-evaluated. Resting comfortably. VSS stable. NAD. Patient notified of testing results. Stated agreement and understanding. Patient stated understanding to treatment plan and diagnosis.  Patient Vitals for the past 24 hrs:  BP Temp Temp src Pulse Resp SpO2  12/15/10 0823  126/70 mmHg 97.7 F (36.5 C) Oral 96  14  100 %     Results for orders placed during the hospital encounter of 12/15/10  URINALYSIS, ROUTINE W REFLEX MICROSCOPIC      Component Value Range   Color, Urine AMBER (*) YELLOW    Appearance CLOUDY (*) CLEAR    Specific Gravity, Urine 1.024  1.005 - 1.030    pH 5.0  5.0 - 8.0    Glucose, UA NEGATIVE  NEGATIVE (mg/dL)   Hgb urine dipstick MODERATE (*) NEGATIVE    Bilirubin Urine SMALL (*) NEGATIVE    Ketones, ur 15 (*) NEGATIVE (mg/dL)   Protein, ur 161 (*) NEGATIVE (mg/dL)   Urobilinogen, UA 1.0  0.0 - 1.0 (mg/dL)   Nitrite NEGATIVE  NEGATIVE    Leukocytes, UA NEGATIVE  NEGATIVE   URINE MICROSCOPIC-ADD ON      Component Value Range   Squamous Epithelial / LPF RARE  RARE    WBC, UA 0-2  <3 (WBC/hpf)   Bacteria, UA RARE  RARE    Casts HYALINE CASTS (*) NEGATIVE    Urine-Other MUCOUS PRESENT    POCT I-STAT, CHEM 8      Component Value Range   Sodium 134 (*) 135 - 145 (mEq/L)   Potassium 4.1  3.5 - 5.1 (mEq/L)   Chloride 100  96 - 112 (mEq/L)   BUN 28 (*) 6 - 23 (mg/dL)   Creatinine, Ser 0.96 (*) 0.50 - 1.35 (mg/dL)   Glucose, Bld 045 (*) 70 - 99 (mg/dL)   Calcium, Ion 4.09 (*) 1.12 - 1.32 (mmol/L)   TCO2 24  0 - 100 (mmol/L)   Hemoglobin 12.9 (*) 13.0 - 17.0 (g/dL)   HCT 81.1 (*) 91.4 - 52.0 (%)  CBC      Component Value Range   WBC 10.5  4.0 - 10.5 (K/uL)   RBC 4.61  4.22 - 5.81 (MIL/uL)   Hemoglobin 13.1  13.0 - 17.0 (g/dL)   HCT 78.2 (*) 95.6 - 52.0 (%)   MCV 77.0 (*) 78.0 - 100.0 (fL)   MCH 28.4  26.0 - 34.0 (pg)   MCHC 36.9 (*) 30.0 - 36.0 (g/dL)   RDW 21.3  08.6 - 57.8 (%)   Platelets 162  150 - 400 (K/uL)   Component     Latest Ref Rng 09/01/2006 09/01/2006 09/01/2006  Creat     0.50 - 1.35 mg/dL  2.7 (H)    Component     Latest Ref Rng 09/02/2006 09/02/2006 09/02/2006  Creat     0.50 - 1.35 mg/dL 4.69 DELTA CHECK NOTED  1.12   Component     Latest Ref Rng 09/03/2006 04/22/2008 12/10/2010 12/15/2010  Creat     0.50  - 1.35 mg/dL 6.29 5.28 4.13 2.44 (H)    10:22 AM Spoke to Dr. Chestine Spore who stated that he will set him up with an appointment  within the next two days.  MDM  UTI Renal insufficiency          Demetrius Charity, PA 12/15/10 1028

## 2010-12-15 NOTE — ED Provider Notes (Signed)
Medical screening examination/treatment/procedure(s) were conducted as a shared visit with non-physician practitioner(s) and myself.  I personally evaluated the patient during the encounter  Presented with persistent symptoms of dysuria and hematuria.  He has already been on a course of antibiotics.  He has been followed by urology as well.  Patient is stable here and has followup with his primary care physician for his small change in renal function.  We can also adjust his antibiotics at this time.  His vital signs are stable and he can tolerate by mouth intake.  Nat Christen, MD 12/15/10 [phone]

## 2010-12-15 NOTE — ED Notes (Signed)
Pt states that he had had blood in his urine x 1 year.  Pt states that he has seen MD for same.  Pt states that he cont. To have same.  Pt states that he has painful urination as well.  Pt denies any abd pain.  Pt states that this blood in his urine is the same exact amount of blood x 1 year..no acute change.

## 2011-05-25 ENCOUNTER — Encounter: Payer: Self-pay | Admitting: Cardiovascular Disease

## 2011-05-25 ENCOUNTER — Ambulatory Visit (INDEPENDENT_AMBULATORY_CARE_PROVIDER_SITE_OTHER): Payer: Medicare Other | Admitting: Cardiovascular Disease

## 2011-05-25 VITALS — BP 118/72 | HR 71 | Ht 71.0 in | Wt 193.1 lb

## 2011-05-25 DIAGNOSIS — I70219 Atherosclerosis of native arteries of extremities with intermittent claudication, unspecified extremity: Secondary | ICD-10-CM

## 2011-05-25 NOTE — Assessment & Plan Note (Signed)
The patient is stable. He is tolerating cilostazol. He cannot take aspirin because of epistaxis but is otherwise on appropriate medical therapy with an ACE inhibitor and statin drug. His blood pressure is well controlled. His lipids are followed by Dr. Chestine Spore. Overall his claudication symptoms are mild and somewhat atypical. I'm not sure if his left knee weakness and pain is related to PAD or an orthopedic problem. In any event, I would recommend continued conservative management with followup in 1 year. When he comes back next year we will check ABIs.

## 2011-05-25 NOTE — Progress Notes (Signed)
   HPI:  76 year old gentleman presenting for follow up evaluation. The patient has lower extremity peripheral arterial disease with short occlusion of the left SFA. He is intolerant to aspirin because of epistaxis. He has been managed conservatively because of mild symptoms and only mildly reduced left ABI in the range of 0.8-0.9. The patient reports no interval change in symptoms since he was last seen one year ago. He continues to have a "weakness" of the left knee with walking. He denies calf pain. He denies any right leg symptoms. The patient overall is doing well he denies chest pain, chest pressure, dyspnea, or edema. He has been compliant with his medications. He has no other complaints today.  Outpatient Encounter Prescriptions as of 05/25/2011  Medication Sig Dispense Refill  . cilostazol (PLETAL) 100 MG tablet Take 100 mg by mouth daily.      . cyclobenzaprine (FLEXERIL) 10 MG tablet Take 10 mg by mouth as needed.      Marland Kitchen lisinopril (PRINIVIL,ZESTRIL) 10 MG tablet Take 10 mg by mouth daily.        . naproxen (NAPROSYN) 500 MG tablet Take 500 mg by mouth daily.      . simvastatin (ZOCOR) 40 MG tablet Take 40 mg by mouth 2 (two) times daily.       Marland Kitchen DISCONTD: Multiple Vitamin (MULTIVITAMIN) tablet Take 1 tablet by mouth daily.          No Known Allergies  Past Medical History  Diagnosis Date  . HTN (hypertension)   . HLD (hyperlipidemia)   . Cancer     prostate    ROS: Negative except as per HPI  BP 118/72  Pulse 71  Ht 5\' 11"  (1.803 m)  Wt 87.599 kg (193 lb 1.9 oz)  BMI 26.93 kg/m2  PHYSICAL EXAM: Pt is alert and oriented, pleasant elderly male in NAD HEENT: normal Neck: JVP - normal, carotids 2+= without bruits Lungs: CTA bilaterally CV: RRR without murmur or gallop Abd: soft, NT, Positive BS, no hepatomegaly Ext: no C/C/E, posterior tibial pulses are nonpalpable bilaterally. DP pulses 1+ on the left and 2+ on the right, the feet are warm. Skin: warm/dry no  rash  EKG:  Normal sinus rhythm 71 beats per minute , age indeterminate anterio, otherwise within normal limits.r infarct  ASSESSMENT AND PLAN:

## 2011-05-25 NOTE — Patient Instructions (Signed)
Your physician wants you to follow-up in: 1 YEAR. You will receive a reminder letter in the mail two months in advance. If you don't receive a letter, please call our office to schedule the follow-up appointment.  Your physician has requested that you have an ankle brachial index (ABI) in 1 YEAR. During this test an ultrasound and blood pressure cuff are used to evaluate the arteries that supply the arms and legs with blood. Allow thirty minutes for this exam. There are no restrictions or special instructions.  Your physician recommends that you continue on your current medications as directed. Please refer to the Current Medication list given to you today.  

## 2011-10-09 ENCOUNTER — Other Ambulatory Visit: Payer: Self-pay | Admitting: Internal Medicine

## 2011-10-09 ENCOUNTER — Ambulatory Visit (HOSPITAL_COMMUNITY)
Admission: RE | Admit: 2011-10-09 | Discharge: 2011-10-09 | Disposition: A | Discharge status: Home / Self Care | Payer: Medicare Other | Source: Ambulatory Visit | Attending: Internal Medicine | Admitting: Internal Medicine

## 2011-10-09 DIAGNOSIS — R911 Solitary pulmonary nodule: Secondary | ICD-10-CM | POA: Insufficient documentation

## 2011-12-23 ENCOUNTER — Other Ambulatory Visit: Payer: Self-pay | Admitting: Internal Medicine

## 2011-12-28 ENCOUNTER — Ambulatory Visit (HOSPITAL_COMMUNITY)
Admission: RE | Admit: 2011-12-28 | Discharge: 2011-12-28 | Disposition: A | Discharge status: Home / Self Care | Payer: Medicare Other | Source: Ambulatory Visit | Attending: Internal Medicine | Admitting: Internal Medicine

## 2011-12-28 DIAGNOSIS — R42 Dizziness and giddiness: Secondary | ICD-10-CM | POA: Insufficient documentation

## 2011-12-28 DIAGNOSIS — R51 Headache: Secondary | ICD-10-CM | POA: Insufficient documentation

## 2011-12-29 ENCOUNTER — Other Ambulatory Visit: Payer: Self-pay

## 2012-02-19 ENCOUNTER — Encounter (HOSPITAL_COMMUNITY): Payer: Self-pay | Admitting: *Deleted

## 2012-02-19 ENCOUNTER — Emergency Department (HOSPITAL_COMMUNITY)
Admission: EM | Admit: 2012-02-19 | Discharge: 2012-02-19 | Disposition: A | Discharge status: Home / Self Care | Payer: Medicare Other | Attending: Emergency Medicine | Admitting: Emergency Medicine

## 2012-02-19 DIAGNOSIS — Z79899 Other long term (current) drug therapy: Secondary | ICD-10-CM | POA: Insufficient documentation

## 2012-02-19 DIAGNOSIS — Z8546 Personal history of malignant neoplasm of prostate: Secondary | ICD-10-CM | POA: Insufficient documentation

## 2012-02-19 DIAGNOSIS — Z87891 Personal history of nicotine dependence: Secondary | ICD-10-CM | POA: Insufficient documentation

## 2012-02-19 DIAGNOSIS — I10 Essential (primary) hypertension: Secondary | ICD-10-CM | POA: Insufficient documentation

## 2012-02-19 DIAGNOSIS — R319 Hematuria, unspecified: Secondary | ICD-10-CM | POA: Insufficient documentation

## 2012-02-19 DIAGNOSIS — E785 Hyperlipidemia, unspecified: Secondary | ICD-10-CM | POA: Insufficient documentation

## 2012-02-19 DIAGNOSIS — R300 Dysuria: Secondary | ICD-10-CM | POA: Insufficient documentation

## 2012-02-19 DIAGNOSIS — R350 Frequency of micturition: Secondary | ICD-10-CM | POA: Insufficient documentation

## 2012-02-19 DIAGNOSIS — K921 Melena: Secondary | ICD-10-CM | POA: Insufficient documentation

## 2012-02-19 DIAGNOSIS — R109 Unspecified abdominal pain: Secondary | ICD-10-CM | POA: Insufficient documentation

## 2012-02-19 LAB — URINALYSIS, ROUTINE W REFLEX MICROSCOPIC
Glucose, UA: NEGATIVE mg/dL
Leukocytes, UA: NEGATIVE
Protein, ur: NEGATIVE mg/dL
Specific Gravity, Urine: 1.017 (ref 1.005–1.030)
pH: 6 (ref 5.0–8.0)

## 2012-02-19 LAB — CBC
MCH: 28 pg (ref 26.0–34.0)
MCHC: 35.9 g/dL (ref 30.0–36.0)
Platelets: 192 10*3/uL (ref 150–400)
RDW: 14.2 % (ref 11.5–15.5)

## 2012-02-19 LAB — COMPREHENSIVE METABOLIC PANEL
ALT: 14 U/L (ref 0–53)
AST: 20 U/L (ref 0–37)
Calcium: 9.4 mg/dL (ref 8.4–10.5)
Sodium: 138 mEq/L (ref 135–145)
Total Protein: 7.5 g/dL (ref 6.0–8.3)

## 2012-02-19 NOTE — ED Notes (Signed)
Pt reports blood in stool x 1 week. Dark in color. Also reports pain to lower abdomen with dark urine. No nausea/vomiting/diarrhea.

## 2012-02-19 NOTE — ED Provider Notes (Signed)
History     CSN: [id number]  Arrival date & time 02/19/12  1136   First MD Initiated Contact with Patient 02/19/12 1305      Chief Complaint  Patient presents with  . Rectal Bleeding  . Abdominal Pain    (Consider location/radiation/quality/duration/timing/severity/associated sxs/prior treatment) Patient is a 77 y.o. male presenting with frequency. The history is provided by the patient. No language interpreter was used.  Urinary Frequency This is a new problem. The current episode started today. The problem occurs constantly. The problem has been gradually worsening. Pertinent negatives include no abdominal pain. Nothing aggravates the symptoms. He has tried nothing for the symptoms. The treatment provided mild relief.   Pt reports his urine has looked dark and his stool has looked dark.   Pt reports he is worried that he has blood in his urine and his stool. Pt reports everything looks dark in the toliet Past Medical History  Diagnosis Date  . HTN (hypertension)   . HLD (hyperlipidemia)   . Cancer     prostate    Past Surgical History  Procedure Date  . Cholecystectomy   . Back surgery 1994    cervical    Family History  Problem Relation Age of Onset  . Coronary artery disease      History  Substance Use Topics  . Smoking status: Former Smoker    Quit date: 02/03/1992  . Smokeless tobacco: Not on file  . Alcohol Use: No      Review of Systems  Gastrointestinal: Negative for abdominal pain.  Genitourinary: Positive for frequency.  All other systems reviewed and are negative.    Allergies  Review of patient's allergies indicates no known allergies.  Home Medications   Current Outpatient Rx  Name  Route  Sig  Dispense  Refill  . CILOSTAZOL 100 MG PO TABS   Oral   Take 100 mg by mouth 2 (two) times daily.         . CYCLOBENZAPRINE HCL 10 MG PO TABS   Oral   Take 10 mg by mouth 3 (three) times daily as needed. For muscle pain.         Marland Kitchen  HYDROCODONE-ACETAMINOPHEN 5-325 MG PO TABS   Oral   Take 1 tablet by mouth every 4 (four) hours as needed. For pain.         Marland Kitchen LISINOPRIL 10 MG PO TABS   Oral   Take 10 mg by mouth daily.           Marland Kitchen METOCLOPRAMIDE HCL 10 MG PO TABS   Oral   Take 10 mg by mouth 4 (four) times daily.         Marland Kitchen OMEPRAZOLE 40 MG PO CPDR   Oral   Take 40 mg by mouth daily.         Marland Kitchen SIMVASTATIN 40 MG PO TABS   Oral   Take 40 mg by mouth at bedtime.            BP 144/82  Pulse 82  Temp 98.4 F (36.9 C) (Oral)  Resp 16  SpO2 98%  Physical Exam  Nursing note and vitals reviewed. Constitutional: He is oriented to person, place, and time. He appears well-developed and well-nourished.  HENT:  Head: Normocephalic.  Right Ear: External ear normal.  Left Ear: External ear normal.  Nose: Nose normal.  Mouth/Throat: Oropharynx is clear and moist.  Eyes: Conjunctivae normal and EOM are normal. Pupils are equal, round, and reactive  to light.  Neck: Normal range of motion. Neck supple.  Cardiovascular: Normal rate and normal heart sounds.   Pulmonary/Chest: Effort normal and breath sounds normal.  Abdominal: Soft. Bowel sounds are normal.  Genitourinary: Rectum normal. Guaiac negative stool.  Musculoskeletal: Normal range of motion.  Neurological: He is alert and oriented to person, place, and time.  Skin: Skin is warm.    ED Course  Procedures (including critical care time)  Labs Reviewed  CBC - Abnormal; Notable for the following:    HCT 37.9 (*)     All other components within normal limits  COMPREHENSIVE METABOLIC PANEL - Abnormal; Notable for the following:    Glucose, Bld 113 (*)     GFR calc non Af Amer 79 (*)     All other components within normal limits  URINALYSIS, ROUTINE W REFLEX MICROSCOPIC   No results found.   1. Dysuria       MDM  No blood in stool or urine,  Possibly just dark stool,   I advised pt to follow up with Dr. Chestine Spore for  recheck        Elson Areas, [place] 02/20/12 [phone]

## 2012-02-21 NOTE — ED Provider Notes (Signed)
Medical screening examination/treatment/procedure(s) were performed by non-physician practitioner and as supervising physician I was immediately available for consultation/collaboration.  Gal Feldhaus T Lindi Abram, MD 02/21/12 1110 

## 2012-05-23 ENCOUNTER — Ambulatory Visit (INDEPENDENT_AMBULATORY_CARE_PROVIDER_SITE_OTHER): Payer: Medicare Other | Admitting: Cardiovascular Disease

## 2012-05-23 ENCOUNTER — Encounter: Payer: Self-pay | Admitting: Cardiovascular Disease

## 2012-05-23 ENCOUNTER — Encounter (INDEPENDENT_AMBULATORY_CARE_PROVIDER_SITE_OTHER): Payer: Medicare Other

## 2012-05-23 ENCOUNTER — Encounter: Payer: Self-pay | Admitting: Cardiology

## 2012-05-23 VITALS — BP 122/86 | HR 66 | Ht 72.0 in | Wt 182.0 lb

## 2012-05-23 DIAGNOSIS — I70219 Atherosclerosis of native arteries of extremities with intermittent claudication, unspecified extremity: Secondary | ICD-10-CM

## 2012-05-23 DIAGNOSIS — I739 Peripheral vascular disease, unspecified: Secondary | ICD-10-CM

## 2012-05-23 NOTE — Patient Instructions (Addendum)
Your physician wants you to follow-up in: 1 YEAR with Dr Excell Seltzer.  You will receive a reminder letter in the mail two months in advance. If you don't receive a letter, please call our office to schedule the follow-up appointment.  Your physician recommends that you continue on your current medications as directed. Please refer to the Current Medication list given to you today. (The pt will call back with an updated list of medications)

## 2012-05-23 NOTE — Progress Notes (Signed)
   HPI:  77 year old gentleman returning for followup of lower extremity peripheral arterial disease. 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 He's had severe epistaxis on aspirin in the past and is unable to tolerate this. He's been managed conservatively because of only mild lifestyle limitation.  Overall he is doing well. He denies chest pain or pressure, dyspnea, edema, or palpitations. Some days his left leg "gives out" and other days he has no problems. He remains active with yard work and gardening. He has no other complaints today. He denies sores or ulceration of the feet.  Outpatient Encounter Prescriptions as of 05/23/2012  Medication Sig Dispense Refill  . cilostazol (PLETAL) 100 MG tablet Take 100 mg by mouth 2 (two) times daily.      Marland Kitchen HYDROcodone-acetaminophen (NORCO/VICODIN) 5-325 MG per tablet Take 1 tablet by mouth every 4 (four) hours as needed. For pain.      Marland Kitchen lisinopril (PRINIVIL,ZESTRIL) 10 MG tablet Take 10 mg by mouth daily.        . metoCLOPramide (REGLAN) 10 MG tablet Take 10 mg by mouth 4 (four) times daily.      Marland Kitchen omeprazole (PRILOSEC) 40 MG capsule Take 40 mg by mouth daily.      . simvastatin (ZOCOR) 40 MG tablet Take 80 mg by mouth at bedtime.       . [DISCONTINUED] cyclobenzaprine (FLEXERIL) 10 MG tablet Take 10 mg by mouth 3 (three) times daily as needed. For muscle pain.       No facility-administered encounter medications on file as of 05/23/2012.    No Known Allergies  Past Medical History  Diagnosis Date  . HTN (hypertension)   . HLD (hyperlipidemia)   . Cancer     prostate    BP 122/86  Pulse 66  Ht 6' (1.829 m)  Wt 82.555 kg (182 lb)  BMI 24.68 kg/m2  SpO2 99%  PHYSICAL EXAM: Pt is alert and oriented, NAD HEENT: normal Neck: JVP - normal, carotids 2+= without bruits Lungs: CTA bilaterally CV: RRR without murmur or gallop Abd: soft, NT, Positive BS, no hepatomegaly Ext: no C/C/E, DP and  PT pulses are 2+ on the right and trace on the left Skin: warm/dry no rash  EKG:  Sinus rhythm 66 beats per minute, cannot rule out age-indeterminate septal infarct, otherwise within normal limits.  ABI: 1.1 on the right and 0.8 on the left.  ASSESSMENT AND PLAN: Lower extremity peripheral arterial disease. This remained stable by the patient's symptoms, his noninvasive studies, and his physical exam. He will continue on his current medical program. He is not on aspirin or Plavix because of severe epistaxis with antiplatelet drugs. He otherwise is on appropriate medical program with an ACE inhibitor and statin drug. He is also taking Pletal. No changes were made today. I will see him back in one year for followup. He has no cardiac symptoms at this time. We discussed the importance of good foot care.  Tonny Bollman 05/23/2012 11:52 AM

## 2012-05-25 ENCOUNTER — Ambulatory Visit: Payer: Medicare Other | Admitting: Cardiovascular Disease

## 2012-11-02 ENCOUNTER — Ambulatory Visit: Payer: Medicare Other | Admitting: Cardiovascular Disease

## 2012-12-15 ENCOUNTER — Telehealth: Payer: Self-pay | Admitting: Cardiovascular Disease

## 2012-12-15 DIAGNOSIS — I70219 Atherosclerosis of native arteries of extremities with intermittent claudication, unspecified extremity: Secondary | ICD-10-CM

## 2012-12-15 NOTE — Telephone Encounter (Signed)
New message    C/o left leg pain and weak----not swollen or red.  Pls advise.

## 2012-12-15 NOTE — Telephone Encounter (Signed)
Dr. Excell Seltzer recommended that due to increased pain in his legs pt should have repeat ABIs & lower extremity duplex. Also a sooner appointment with PA same day Dr. Excell Seltzer is in the office   I spoke with pt and he agrees.  Appointment made with Norma Fredrickson on 12/28/12 same day as Dr. Excell Seltzer is in the office. His Lower extremity duplex & ABIs are scheduled for tomorrow at 12:30pm  Pt is aware & agrees with both appointments Mylo Red RN

## 2012-12-15 NOTE — Telephone Encounter (Signed)
I spoke with Dr. Excell Seltzer & he has recommended

## 2012-12-15 NOTE — Telephone Encounter (Signed)
Pt calls today with progressive worsening of his leg & thigh pain.States this has been getting worse for the past 2-3 months but could not get an appointment until next month with Dr. Excell Seltzer.  He did not want to see the PA even on the same day as Dr. Excell Seltzer is in the office.  Pt denies any redness, heat, or swelling. Just that

## 2012-12-16 ENCOUNTER — Ambulatory Visit (HOSPITAL_COMMUNITY): Payer: Medicare Other | Attending: Cardiovascular Disease

## 2012-12-16 DIAGNOSIS — I739 Peripheral vascular disease, unspecified: Secondary | ICD-10-CM | POA: Insufficient documentation

## 2012-12-16 DIAGNOSIS — I10 Essential (primary) hypertension: Secondary | ICD-10-CM | POA: Insufficient documentation

## 2012-12-16 DIAGNOSIS — E785 Hyperlipidemia, unspecified: Secondary | ICD-10-CM | POA: Insufficient documentation

## 2012-12-16 DIAGNOSIS — Z8673 Personal history of transient ischemic attack (TIA), and cerebral infarction without residual deficits: Secondary | ICD-10-CM | POA: Insufficient documentation

## 2012-12-16 DIAGNOSIS — I70209 Unspecified atherosclerosis of native arteries of extremities, unspecified extremity: Secondary | ICD-10-CM | POA: Insufficient documentation

## 2012-12-16 DIAGNOSIS — Z87891 Personal history of nicotine dependence: Secondary | ICD-10-CM | POA: Insufficient documentation

## 2012-12-16 DIAGNOSIS — I70219 Atherosclerosis of native arteries of extremities with intermittent claudication, unspecified extremity: Secondary | ICD-10-CM

## 2012-12-28 ENCOUNTER — Encounter: Payer: Self-pay | Admitting: Nurse Practitioner

## 2012-12-28 ENCOUNTER — Ambulatory Visit (INDEPENDENT_AMBULATORY_CARE_PROVIDER_SITE_OTHER): Payer: Medicare Other | Admitting: Nurse Practitioner

## 2012-12-28 ENCOUNTER — Encounter (INDEPENDENT_AMBULATORY_CARE_PROVIDER_SITE_OTHER): Payer: Self-pay

## 2012-12-28 VITALS — BP 118/70 | HR 64 | Ht 72.0 in | Wt 189.8 lb

## 2012-12-28 DIAGNOSIS — I739 Peripheral vascular disease, unspecified: Secondary | ICD-10-CM

## 2012-12-28 NOTE — Patient Instructions (Signed)
Cancel December visit with Dr. Excell Seltzer  Needs to have repeat ABIs and lower extremity duplex in one year with follow up with Dr. Excell Seltzer  Stay on current medicines  Ok to see orthopedics about your knee  Call the [facility] office at [phone] if you have any questions, problems or concerns.

## 2012-12-28 NOTE — Progress Notes (Signed)
Renelda Loma Date of Birth: [birth year] Medical Record #[id number]  History of Present Illness: Mr. Ladnier is seen today for a follow up visit. Seen for Dr. Excell Seltzer. He has known lower extremity PAD with a known short segment occlusion of the left distal SFA and has been managed medically. Has had severe epistaxis on aspirin the past and does not tolerate. He has previously had just mild lifestyle limitations. Lipids are checked by PCP.  Other issues include HTN, HLD and prostate cancer.   Called with progressive leg and thigh pain earlier this month. Has had his ABI's & lower extremity duplex.   Comes back today for discussion. Here alone. Complaining of pain in the left leg - mostly around his knee - no calf pain whatsoever. He points to his knee when asked "where does it hurt". Hurts if he just turns his leg certain ways. Worse with walking and really he is not walking. Has a known occluded left SFA. No ulcers noted. Remains on his statin.   Current Outpatient Prescriptions  Medication Sig Dispense Refill  . cilostazol (PLETAL) 100 MG tablet Take 100 mg by mouth 2 (two) times daily.      Marland Kitchen HYDROcodone-acetaminophen (NORCO/VICODIN) 5-325 MG per tablet Take 1 tablet by mouth every 4 (four) hours as needed. For pain.      Marland Kitchen lisinopril (PRINIVIL,ZESTRIL) 10 MG tablet Take 10 mg by mouth daily.        Marland Kitchen omeprazole (PRILOSEC) 40 MG capsule Take 40 mg by mouth daily.      . simvastatin (ZOCOR) 40 MG tablet Take 40 mg by mouth at bedtime.        No current facility-administered medications for this visit.    No Known Allergies  Past Medical History  Diagnosis Date  . HTN (hypertension)   . HLD (hyperlipidemia)   . Prostate cancer   . PVD (peripheral vascular disease)     Past Surgical History  Procedure Laterality Date  . Cholecystectomy    . Back surgery  1994    cervical    History  Smoking status  . Former Smoker  . Quit date: 02/03/1992  Smokeless tobacco  . Not on file     History  Alcohol Use No    Family History  Problem Relation Age of Onset  . Coronary artery disease    . Heart disease Mother   . Heart attack Mother   . Heart disease Father   . Heart attack Father   . Heart disease Brother   . Heart attack Brother     Review of Systems: The review of systems is per the HPI.  All other systems were reviewed and are negative.  Physical Exam: BP 118/70  Pulse 64  Ht 6' (1.829 m)  Wt 189 lb 12.8 oz (86.093 kg)  BMI 25.74 kg/m2 Patient is very pleasant and in no acute distress. Skin is warm and dry. Color is normal.  HEENT is unremarkable. Normocephalic/atraumatic. PERRL. Sclera are nonicteric. Neck is supple. No masses. No JVD. Lungs are clear. Cardiac exam shows a regular rate and rhythm. Abdomen is soft. Extremities are without edema. His left femoral pulse is 2+ per Dr. Excell Seltzer. Gait and ROM are intact. No gross neurologic deficits noted.  LABORATORY DATA:   Chemistry      Component Value Date/Time   NA 138 02/19/2012 1250   K 4.1 02/19/2012 1250   CL 102 02/19/2012 1250   CO2 27 02/19/2012 1250   BUN 13 02/19/2012  1250   CREATININE 0.99 02/19/2012 1250      Component Value Date/Time   CALCIUM 9.4 02/19/2012 1250   ALKPHOS 80 02/19/2012 1250   AST 20 02/19/2012 1250   ALT 14 02/19/2012 1250   BILITOT 0.5 02/19/2012 1250     Lab Results  Component Value Date   WBC 7.3 02/19/2012   HGB 13.6 02/19/2012   HCT 37.9* 02/19/2012   MCV 78.0 02/19/2012   PLT 192 02/19/2012   No results found for this basename: CHOL,  HDL,  LDLCALC,  LDLDIRECT,  TRIG,  CHOLHDL    Assessment / Plan: 1. PVD - his current symptoms are not felt to be vascular - seems to be more just "knee pain". He is subsequently seen with Dr. Excell Seltzer who recommends continued medical management. Repeat studies in one year with follow up with Dr. Excell Seltzer in 1 year.   2. HTN - BP looks good  3. HLD - on statin.  Patient is agreeable to this plan and will call if any problems  develop in the interim.   Rosalio Macadamia, RN, ANP-C [facility] [address] [place], [place]  [address]

## 2013-01-18 ENCOUNTER — Ambulatory Visit: Payer: Medicare Other | Admitting: Cardiovascular Disease

## 2013-01-31 ENCOUNTER — Encounter: Payer: Self-pay | Admitting: Cardiovascular Disease

## 2013-04-14 ENCOUNTER — Emergency Department (HOSPITAL_COMMUNITY)
Admission: EM | Admit: 2013-04-14 | Discharge: 2013-04-14 | Disposition: A | Discharge status: Home / Self Care | Payer: Medicare Other | Attending: Emergency Medicine | Admitting: Emergency Medicine

## 2013-04-14 ENCOUNTER — Encounter (HOSPITAL_COMMUNITY): Payer: Self-pay | Admitting: Emergency Medicine

## 2013-04-14 DIAGNOSIS — Z87891 Personal history of nicotine dependence: Secondary | ICD-10-CM | POA: Insufficient documentation

## 2013-04-14 DIAGNOSIS — Z79899 Other long term (current) drug therapy: Secondary | ICD-10-CM | POA: Insufficient documentation

## 2013-04-14 DIAGNOSIS — I10 Essential (primary) hypertension: Secondary | ICD-10-CM | POA: Insufficient documentation

## 2013-04-14 DIAGNOSIS — E785 Hyperlipidemia, unspecified: Secondary | ICD-10-CM | POA: Insufficient documentation

## 2013-04-14 DIAGNOSIS — Z8546 Personal history of malignant neoplasm of prostate: Secondary | ICD-10-CM | POA: Insufficient documentation

## 2013-04-14 DIAGNOSIS — N390 Urinary tract infection, site not specified: Secondary | ICD-10-CM | POA: Insufficient documentation

## 2013-04-14 LAB — URINE MICROSCOPIC-ADD ON

## 2013-04-14 LAB — CBC WITH DIFFERENTIAL/PLATELET
BASOS PCT: 0 % (ref 0–1)
Basophils Absolute: 0.1 10*3/uL (ref 0.0–0.1)
EOS ABS: 0.1 10*3/uL (ref 0.0–0.7)
EOS PCT: 1 % (ref 0–5)
HCT: 36.3 % — ABNORMAL LOW (ref 39.0–52.0)
HEMOGLOBIN: 13.1 g/dL (ref 13.0–17.0)
Lymphocytes Relative: 13 % (ref 12–46)
Lymphs Abs: 1.9 10*3/uL (ref 0.7–4.0)
MCH: 29 pg (ref 26.0–34.0)
MCHC: 36.1 g/dL — AB (ref 30.0–36.0)
MCV: 80.5 fL (ref 78.0–100.0)
MONOS PCT: 7 % (ref 3–12)
Monocytes Absolute: 1 10*3/uL (ref 0.1–1.0)
NEUTROS PCT: 79 % — AB (ref 43–77)
Neutro Abs: 11 10*3/uL — ABNORMAL HIGH (ref 1.7–7.7)
Platelets: 161 10*3/uL (ref 150–400)
RBC: 4.51 MIL/uL (ref 4.22–5.81)
RDW: 14.5 % (ref 11.5–15.5)
WBC: 14 10*3/uL — ABNORMAL HIGH (ref 4.0–10.5)

## 2013-04-14 LAB — URINALYSIS, ROUTINE W REFLEX MICROSCOPIC
Glucose, UA: NEGATIVE mg/dL
KETONES UR: NEGATIVE mg/dL
NITRITE: NEGATIVE
PROTEIN: 100 mg/dL — AB
SPECIFIC GRAVITY, URINE: 1.026 (ref 1.005–1.030)
UROBILINOGEN UA: 1 mg/dL (ref 0.0–1.0)
pH: 5.5 (ref 5.0–8.0)

## 2013-04-14 LAB — BASIC METABOLIC PANEL
BUN: 18 mg/dL (ref 6–23)
CALCIUM: 9.5 mg/dL (ref 8.4–10.5)
CO2: 25 mEq/L (ref 19–32)
CREATININE: 1.16 mg/dL (ref 0.50–1.35)
Chloride: 98 mEq/L (ref 96–112)
GFR, EST AFRICAN AMERICAN: 69 mL/min — AB (ref >90)
GFR, EST NON AFRICAN AMERICAN: 60 mL/min — AB (ref >90)
Glucose, Bld: 178 mg/dL — ABNORMAL HIGH (ref 70–99)
POTASSIUM: 4.4 meq/L (ref 3.7–5.3)
Sodium: 137 mEq/L (ref 137–147)

## 2013-04-14 MED ORDER — DEXTROSE 5 % IV SOLN
1.0000 g | Freq: Once | INTRAVENOUS | Status: AC
  Administered 2013-04-14: 1 g via INTRAVENOUS
  Filled 2013-04-14: qty 10

## 2013-04-14 MED ORDER — SODIUM CHLORIDE 0.9 % IV BOLUS (SEPSIS)
1000.0000 mL | INTRAVENOUS | Status: AC
  Administered 2013-04-14: 1000 mL via INTRAVENOUS

## 2013-04-14 MED ORDER — CEFPODOXIME PROXETIL 200 MG PO TABS: 200.0000 mg | ORAL_TABLET | Freq: Two times a day (BID) | ORAL | Status: DC

## 2013-04-14 NOTE — ED Notes (Signed)
Pt. Reports worry over "dark urine" for the past 4 days. Reports burning and pain with urination. Reports frequent urination. Pt. A&O an in NAD.

## 2013-04-14 NOTE — ED Notes (Addendum)
Pt. Aware of given a urine specimen. Pt. Unable to urinate at this time.  Urinal  at the bedside. Will collect when ready. Nurses was notified.

## 2013-04-14 NOTE — ED Provider Notes (Signed)
CSN: [id number]     Arrival date & time 04/14/13  0940 History   First MD Initiated Contact with Patient 04/14/13 0945     Chief Complaint  Patient presents with  . Hematuria     (Consider location/radiation/quality/duration/timing/severity/associated sxs/prior Treatment) Patient is a 78 y.o. male presenting with hematuria. The history is provided by the patient.  Hematuria This is a new problem. Episode onset: 4 days ago. The problem occurs constantly. The problem has not changed since onset.Pertinent negatives include no chest pain, no abdominal pain, no headaches and no shortness of breath. Nothing aggravates the symptoms. Nothing relieves the symptoms. He has tried nothing for the symptoms. The treatment provided no relief.    Past Medical History  Diagnosis Date  . HTN (hypertension)   . HLD (hyperlipidemia)   . Prostate cancer   . PVD (peripheral vascular disease)    Past Surgical History  Procedure Laterality Date  . Cholecystectomy    . Back surgery  1994    cervical   Family History  Problem Relation Age of Onset  . Coronary artery disease    . Heart disease Mother   . Heart attack Mother   . Heart disease Father   . Heart attack Father   . Heart disease Brother   . Heart attack Brother    History  Substance Use Topics  . Smoking status: Former Smoker    Quit date: 02/03/1992  . Smokeless tobacco: Not on file  . Alcohol Use: No    Review of Systems  Constitutional: Negative for fever.  HENT: Negative for drooling and rhinorrhea.   Eyes: Negative for pain.  Respiratory: Negative for cough and shortness of breath.   Cardiovascular: Negative for chest pain and leg swelling.  Gastrointestinal: Negative for nausea, vomiting, abdominal pain and diarrhea.  Genitourinary: Positive for dysuria, frequency and hematuria.  Musculoskeletal: Negative for gait problem and neck pain.  Skin: Negative for color change.  Neurological: Negative for numbness and headaches.   Hematological: Negative for adenopathy.  Psychiatric/Behavioral: Negative for behavioral problems.  All other systems reviewed and are negative.      Allergies  Review of patient's allergies indicates no known allergies.  Home Medications   Current Outpatient Rx  Name  Route  Sig  Dispense  Refill  . cilostazol (PLETAL) 100 MG tablet   Oral   Take 100 mg by mouth 2 (two) times daily.         Marland Kitchen HYDROcodone-acetaminophen (NORCO/VICODIN) 5-325 MG per tablet   Oral   Take 1 tablet by mouth every 4 (four) hours as needed. For pain.         Marland Kitchen lisinopril (PRINIVIL,ZESTRIL) 10 MG tablet   Oral   Take 10 mg by mouth daily.           Marland Kitchen omeprazole (PRILOSEC) 40 MG capsule   Oral   Take 40 mg by mouth daily.         . simvastatin (ZOCOR) 40 MG tablet   Oral   Take 40 mg by mouth at bedtime.           BP 114/81  Pulse 97  SpO2 99% Physical Exam  Nursing note and vitals reviewed. Constitutional: He is oriented to person, place, and time. He appears well-developed and well-nourished.  HENT:  Head: Normocephalic and atraumatic.  Right Ear: External ear normal.  Left Ear: External ear normal.  Nose: Nose normal.  Mouth/Throat: Oropharynx is clear and moist. No oropharyngeal exudate.  Eyes: Conjunctivae and EOM are normal. Pupils are equal, round, and reactive to light.  Neck: Normal range of motion. Neck supple.  Cardiovascular: Normal rate, regular rhythm, normal heart sounds and intact distal pulses.  Exam reveals no gallop and no friction rub.   No murmur heard. Pulmonary/Chest: Effort normal and breath sounds normal. No respiratory distress. He has no wheezes.  Abdominal: Soft. Bowel sounds are normal. He exhibits no distension. There is no tenderness. There is no rebound and no guarding.  Musculoskeletal: Normal range of motion. He exhibits no edema and no tenderness.  Neurological: He is alert and oriented to person, place, and time.  Skin: Skin is warm and  dry.  Psychiatric: He has a normal mood and affect. His behavior is normal.    ED Course  Procedures (including critical care time) Labs Review Labs Reviewed  CBC WITH DIFFERENTIAL - Abnormal; Notable for the following:    WBC 14.0 (*)    HCT 36.3 (*)    MCHC 36.1 (*)    Neutrophils Relative % 79 (*)    Neutro Abs 11.0 (*)    All other components within normal limits  BASIC METABOLIC PANEL - Abnormal; Notable for the following:    Glucose, Bld 178 (*)    GFR calc non Af Amer 60 (*)    GFR calc Af Amer 69 (*)    All other components within normal limits  URINALYSIS, ROUTINE W REFLEX MICROSCOPIC - Abnormal; Notable for the following:    Color, Urine AMBER (*)    APPearance TURBID (*)    Hgb urine dipstick MODERATE (*)    Bilirubin Urine SMALL (*)    Protein, ur 100 (*)    Leukocytes, UA MODERATE (*)    All other components within normal limits  URINE MICROSCOPIC-ADD ON - Abnormal; Notable for the following:    Bacteria, UA FEW (*)    All other components within normal limits  URINE CULTURE   Imaging Review No results found.   EKG Interpretation None      MDM   Final diagnoses:  UTI (lower urinary tract infection)    9:59 AM 78 y.o. male who presents with dark appearing urine, frequency, and dysuria over the last 4 days. He denies any fevers, vomiting, diarrhea, or abd/flank/back pain. He states that he does take aspirin intermittently for different aches and pains. I recommended Tylenol instead. He is afebrile and vital signs are unremarkable here. Will get screening labwork, urinalysis, and IV fluid.  1:51 PM: UA c/w UTI. Pt states he has had similar sx in the past w/ a UTI. Will give IV rocephin x1 here and home on vantin. I have discussed the diagnosis/risks/treatment options with the patient and believe the pt to be eligible for discharge home to follow-up with pcp next week. We also discussed returning to the ED immediately if new or worsening sx occur. We  discussed the sx which are most concerning (e.g., worsening sx, fever, back pain, frank blood in Urine) that necessitate immediate return. Medications administered to the patient during their visit and any new prescriptions provided to the patient are listed below.  Medications given during this visit Medications  sodium chloride 0.9 % bolus 1,000 mL (1,000 mLs Intravenous New Bag/Given 04/14/13 1044)  cefTRIAXone (ROCEPHIN) 1 g in dextrose 5 % 50 mL IVPB (1 g Intravenous New Bag/Given 04/14/13 1303)    Discharge Medication List as of 04/14/2013  1:52 PM    START taking these medications  Details  cefpodoxime (VANTIN) 200 MG tablet Take 1 tablet (200 mg total) by mouth 2 (two) times daily., Starting 04/14/2013, Until Discontinued, Print         Blanchard Kelch, MD 04/15/13 1045

## 2013-04-14 NOTE — ED Notes (Signed)
Pt aware of the need of urine. Pt given urinal.

## 2013-04-15 LAB — URINE CULTURE
Colony Count: NO GROWTH
Culture: NO GROWTH

## 2013-06-28 ENCOUNTER — Encounter: Payer: Self-pay | Admitting: Cardiovascular Disease

## 2013-06-28 ENCOUNTER — Ambulatory Visit (INDEPENDENT_AMBULATORY_CARE_PROVIDER_SITE_OTHER): Payer: Medicare Other | Admitting: Cardiovascular Disease

## 2013-06-28 VITALS — BP 110/62 | HR 63 | Ht 72.0 in | Wt 182.0 lb

## 2013-06-28 DIAGNOSIS — I70219 Atherosclerosis of native arteries of extremities with intermittent claudication, unspecified extremity: Secondary | ICD-10-CM

## 2013-06-28 NOTE — Patient Instructions (Signed)
Your physician wants you to follow-up in: 1 YEAR with Dr Cooper.  You will receive a reminder letter in the mail two months in advance. If you don't receive a letter, please call our office to schedule the follow-up appointment.  Your physician recommends that you continue on your current medications as directed. Please refer to the Current Medication list given to you today.  

## 2013-06-28 NOTE — Progress Notes (Signed)
   HPI:  78 year old gentleman returning for followup of lower extremity peripheral arterial disease. 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 He's had severe epistaxis on aspirin in the past and is unable to tolerate this. He's been managed conservatively because of only mild lifestyle limitation.  He continues to have problems with his left knee. He is considering an evaluation by his orthopedist. The knee "gives out" and also causes pain with both standing and walking. He denies calf or foot pain with walking. There is no pain in the right leg. He does ambulate with a cane. He denies chest pain, chest pressure, or shortness of breath. Lab work is followed closely by his primary physician.  Outpatient Encounter Prescriptions as of 06/28/2013  Medication Sig  . cilostazol (PLETAL) 100 MG tablet Take 100 mg by mouth 2 (two) times daily.  Marland Kitchen lisinopril (PRINIVIL,ZESTRIL) 10 MG tablet Take 10 mg by mouth daily.    Marland Kitchen omeprazole (PRILOSEC) 40 MG capsule Take 40 mg by mouth at bedtime.   . simvastatin (ZOCOR) 40 MG tablet Take 40 mg by mouth at bedtime.   . [DISCONTINUED] aspirin 325 MG tablet Take 650 mg by mouth every 4 (four) hours as needed for mild pain.  . [DISCONTINUED] cefpodoxime (VANTIN) 200 MG tablet Take 1 tablet (200 mg total) by mouth 2 (two) times daily.    Allergies  Allergen Reactions  . Aspirin Other (See Comments)    81mg  causes nose bleeds    Past Medical History  Diagnosis Date  . HTN (hypertension)   . HLD (hyperlipidemia)   . Prostate cancer   . PVD (peripheral vascular disease)     BP 110/62  Pulse 63  Ht 6' (1.829 m)  Wt 82.555 kg (182 lb)  BMI 24.68 kg/m2  PHYSICAL EXAM: Pt is alert and oriented, NAD HEENT: normal Neck: JVP - normal, carotids 2+= without bruits Lungs: CTA bilaterally CV: RRR without murmur or gallop Abd: soft, NT, Positive BS, no hepatomegaly Ext: no C/C/E, DP and PT pulses are 2+ on the  right and trace on the left Skin: warm/dry no rash. There is onychomycosis of the left great toe  EKG:  Normal sinus rhythm 63 beats per minute, within normal limits.  ABI: November 2014: Right leg 0.98, left leg 0.72. Doppler studies demonstrated findings suggestive of iliac disease and also known left SFA occlusion.  ASSESSMENT AND PLAN: Lower extremity peripheral arterial disease. This remains stable by the patient's symptoms, his noninvasive studies, and his physical exam. I do not appreciate evidence of significant inflow disease. He will continue on his current medical program. He is not on aspirin or Plavix because of severe epistaxis with antiplatelet drugs. He otherwise is on appropriate medical program with an ACE inhibitor and statin drug. He does tolerate Pletal. I will see him back in one year for followup. He has no cardiac symptoms at this time.   Sherren Mocha 06/28/2013 10:02 AM

## 2013-06-29 ENCOUNTER — Ambulatory Visit: Payer: Medicare Other | Admitting: Cardiovascular Disease

## 2013-12-06 ENCOUNTER — Telehealth: Payer: Self-pay | Admitting: Cardiovascular Disease

## 2013-12-06 NOTE — Telephone Encounter (Signed)
New Message  Pt called

## 2014-01-03 ENCOUNTER — Other Ambulatory Visit (HOSPITAL_COMMUNITY): Payer: Self-pay | Admitting: *Deleted

## 2014-01-03 ENCOUNTER — Ambulatory Visit (HOSPITAL_COMMUNITY): Payer: Medicare Other | Attending: Internal Medicine | Admitting: *Deleted

## 2014-01-03 DIAGNOSIS — Z87891 Personal history of nicotine dependence: Secondary | ICD-10-CM | POA: Diagnosis not present

## 2014-01-03 DIAGNOSIS — I739 Peripheral vascular disease, unspecified: Secondary | ICD-10-CM

## 2014-01-03 DIAGNOSIS — Z8673 Personal history of transient ischemic attack (TIA), and cerebral infarction without residual deficits: Secondary | ICD-10-CM | POA: Diagnosis not present

## 2014-01-03 DIAGNOSIS — E785 Hyperlipidemia, unspecified: Secondary | ICD-10-CM | POA: Insufficient documentation

## 2014-01-03 DIAGNOSIS — I10 Essential (primary) hypertension: Secondary | ICD-10-CM | POA: Diagnosis not present

## 2014-01-03 NOTE — Progress Notes (Signed)
ABI Complete 

## 2014-01-04 ENCOUNTER — Other Ambulatory Visit (HOSPITAL_COMMUNITY): Payer: Medicare Other

## 2014-01-08 ENCOUNTER — Encounter: Payer: Self-pay | Admitting: Cardiovascular Disease

## 2014-01-08 NOTE — Telephone Encounter (Signed)
New Msg  Pt returning call that he states he received today Please contact at [phone].

## 2014-01-08 NOTE — Telephone Encounter (Signed)
This encounter was created in error - please disregard.

## 2014-03-07 DIAGNOSIS — M150 Primary generalized (osteo)arthritis: Secondary | ICD-10-CM | POA: Diagnosis not present

## 2014-03-07 DIAGNOSIS — R1312 Dysphagia, oropharyngeal phase: Secondary | ICD-10-CM | POA: Diagnosis not present

## 2014-03-07 DIAGNOSIS — I10 Essential (primary) hypertension: Secondary | ICD-10-CM | POA: Diagnosis not present

## 2014-03-07 DIAGNOSIS — R51 Headache: Secondary | ICD-10-CM | POA: Diagnosis not present

## 2014-03-09 DIAGNOSIS — M79674 Pain in right toe(s): Secondary | ICD-10-CM | POA: Diagnosis not present

## 2014-03-09 DIAGNOSIS — L600 Ingrowing nail: Secondary | ICD-10-CM | POA: Diagnosis not present

## 2014-03-09 DIAGNOSIS — M79675 Pain in left toe(s): Secondary | ICD-10-CM | POA: Diagnosis not present

## 2014-03-23 DIAGNOSIS — T8189XD Other complications of procedures, not elsewhere classified, subsequent encounter: Secondary | ICD-10-CM | POA: Diagnosis not present

## 2014-04-11 DIAGNOSIS — R51 Headache: Secondary | ICD-10-CM | POA: Diagnosis not present

## 2014-04-11 DIAGNOSIS — M150 Primary generalized (osteo)arthritis: Secondary | ICD-10-CM | POA: Diagnosis not present

## 2014-04-11 DIAGNOSIS — I10 Essential (primary) hypertension: Secondary | ICD-10-CM | POA: Diagnosis not present

## 2014-04-11 DIAGNOSIS — I2510 Atherosclerotic heart disease of native coronary artery without angina pectoris: Secondary | ICD-10-CM | POA: Diagnosis not present

## 2014-05-07 ENCOUNTER — Ambulatory Visit (HOSPITAL_COMMUNITY): Payer: Medicare Other

## 2014-05-09 DIAGNOSIS — I2510 Atherosclerotic heart disease of native coronary artery without angina pectoris: Secondary | ICD-10-CM | POA: Diagnosis not present

## 2014-05-09 DIAGNOSIS — M150 Primary generalized (osteo)arthritis: Secondary | ICD-10-CM | POA: Diagnosis not present

## 2014-05-09 DIAGNOSIS — R51 Headache: Secondary | ICD-10-CM | POA: Diagnosis not present

## 2014-05-09 DIAGNOSIS — I10 Essential (primary) hypertension: Secondary | ICD-10-CM | POA: Diagnosis not present

## 2014-06-13 DIAGNOSIS — C61 Malignant neoplasm of prostate: Secondary | ICD-10-CM | POA: Diagnosis not present

## 2014-06-21 DIAGNOSIS — I2510 Atherosclerotic heart disease of native coronary artery without angina pectoris: Secondary | ICD-10-CM | POA: Diagnosis not present

## 2014-06-21 DIAGNOSIS — R51 Headache: Secondary | ICD-10-CM | POA: Diagnosis not present

## 2014-06-21 DIAGNOSIS — M150 Primary generalized (osteo)arthritis: Secondary | ICD-10-CM | POA: Diagnosis not present

## 2014-06-21 DIAGNOSIS — I10 Essential (primary) hypertension: Secondary | ICD-10-CM | POA: Diagnosis not present

## 2014-07-17 ENCOUNTER — Ambulatory Visit (INDEPENDENT_AMBULATORY_CARE_PROVIDER_SITE_OTHER): Payer: Medicare Other | Admitting: Cardiovascular Disease

## 2014-07-17 ENCOUNTER — Encounter: Payer: Self-pay | Admitting: Cardiovascular Disease

## 2014-07-17 VITALS — BP 90/60 | HR 60 | Ht 72.0 in | Wt 184.6 lb

## 2014-07-17 DIAGNOSIS — R079 Chest pain, unspecified: Secondary | ICD-10-CM

## 2014-07-17 DIAGNOSIS — R011 Cardiac murmur, unspecified: Secondary | ICD-10-CM | POA: Diagnosis not present

## 2014-07-17 MED ORDER — LISINOPRIL 5 MG PO TABS: 5.0000 mg | ORAL_TABLET | Freq: Every day | ORAL | Status: DC

## 2014-07-17 NOTE — Patient Instructions (Signed)
Medication Instructions:  Your physician has recommended you make the following change in your medication:  1. DECREASE Lisinopril to 5mg  take one by mouth daily  Labwork: No new orders.  Testing/Procedures: Your physician has requested that you have an echocardiogram. Echocardiography is a painless test that uses sound waves to create images of your heart. It provides your doctor with information about the size and shape of your heart and how well your heart's chambers and valves are working. This procedure takes approximately one hour. There are no restrictions for this procedure.  Your physician has requested that you have an exercise stress myoview. For further information please visit [email or web address]. Please follow instruction sheet, as given.  Follow-Up: Your physician wants you to follow-up in: 1 YEAR with Dr Burt Knack.  You will receive a reminder letter in the mail two months in advance. If you don't receive a letter, please call our office to schedule the follow-up appointment.   Any Other Special Instructions Will Be Listed Below (If Applicable).

## 2014-07-17 NOTE — Progress Notes (Signed)
Cardiology Office Note   Date:  07/19/2014   ID:  Ricky [name], DOB 1935-04-21, MRN [id number]  PCP:  [name], MD  Cardiologist:  Sherren Mocha, MD    Chief Complaint  Patient presents with  . Leg Pain    History of Present Illness: Ricky [name] is a 79 y.o. male who presents for follow-up of peripheral arterial disease.  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 He's had severe epistaxis on aspirin in the past and is unable to tolerate this. He's been managed conservatively because of only mild lifestyle limitation.  He describes a feeling that his heart is racing with physical activity such as mowing the yard. He denies exertional chest pain or pressure, but does complain of occasional left arm with physical activity. Describes DOE with mowing the lawn as well. No leg swelling, orthopnea, or PND. Continues to have episodes where his left knee 'gives out.' Also complains of left calf cramping at rest and with activity. No orthopnea, or PND.    Past Medical History  Diagnosis Date  . HTN (hypertension)   . HLD (hyperlipidemia)   . Prostate cancer   . PVD (peripheral vascular disease)     Past Surgical History  Procedure Laterality Date  . Cholecystectomy    . Back surgery  1994    cervical    Current Outpatient Prescriptions  Medication Sig Dispense Refill  . cilostazol (PLETAL) 100 MG tablet Take 100 mg by mouth 2 (two) times daily.    Marland Kitchen lisinopril (PRINIVIL,ZESTRIL) 5 MG tablet Take 1 tablet (5 mg total) by mouth daily. 30 tablet 11  . metoCLOPramide (REGLAN) 10 MG tablet Take 10 mg by mouth at bedtime.  3  . simvastatin (ZOCOR) 40 MG tablet Take 40 mg by mouth at bedtime.      No current facility-administered medications for this visit.    Allergies:   Aspirin   Social History:  The patient  reports that he quit smoking about 22 years ago. He does not have any smokeless tobacco history on file. He reports  that he does not drink alcohol or use illicit drugs.   Family History:  The patient's  family history includes Coronary artery disease in an other family member; Heart attack in his brother, father, and mother; Heart disease in his brother, father, and mother.    ROS:  Please see the history of present illness.  Otherwise, review of systems is positive for hearing loss, visual disturbance, back pain, muscle pain, dizziness, leg pain, snoring, balance problems, and headaches.  All other systems are reviewed and negative.    PHYSICAL EXAM: VS:  BP 90/60 mmHg  Pulse 60  Ht 6' (1.829 m)  Wt 184 lb 9.6 oz (83.734 kg)  BMI 25.03 kg/m2 , BMI Body mass index is 25.03 kg/(m^2). GEN: Well nourished, well developed, pleasant elderly male in no acute distress HEENT: normal Neck: no JVD, no masses. No carotid bruits Cardiac: RRR with 2/6 harsh mid-peaking systolic murmur at the RUSB           Respiratory:  clear to auscultation bilaterally, normal work of breathing GI: soft, nontender, nondistended, + BS MS: no deformity or atrophy Ext: no pretibial edema Skin: warm and dry, no rash Neuro:  Strength and sensation are intact Psych: euthymic mood, full affect  EKG:  EKG is ordered today. The ekg ordered today shows NSR 60 bpm, within normal limits  Recent Labs:  No results found for requested labs within last 365 days.   Lipid Panel  No results found for: CHOL, TRIG, HDL, CHOLHDL, VLDL, LDLCALC, LDLDIRECT    Wt Readings from Last 3 Encounters:  07/17/14 184 lb 9.6 oz (83.734 kg)  06/28/13 182 lb (82.555 kg)  12/28/12 189 lb 12.8 oz (86.093 kg)     Cardiac Studies Reviewed: ABI's 01/04/2014: 1.1 on right and 0.85 on left, stable from previous studies  ASSESSMENT AND PLAN: 1.  Lower extremity peripheral arterial disease: He appears to be stable. I reviewed his most recent ABIs which are unchanged and the left ABI is only in the mild range despite total occlusion of the left superficial  femoral artery. Will continue with his current management.  2. Cardiac murmur: He has developed a murmur of aortic stenosis. Considering his symptoms of occasional lightheadedness, exertional arm discomfort, and palpitations, I have recommended an echocardiogram.  3. Exertional left arm pain: Considering his known peripheral arterial disease and multiple cardiac risk factors, I have ordered an exercise Myoview stress test. This should also help to evaluate tachy-palpitations which are occurring with physical activity.  4. Essential hypertension: The patient's blood pressure is low today. Most readings at home he reports in the range of about 110-115 mmHg. I asked him to reduce his lisinopril dose to 5 mg daily and to drink plenty of fluids.  Current medicines are reviewed with the patient today.  The patient does not have concerns regarding medicines.  Labs/ tests ordered today include:   Orders Placed This Encounter  Procedures  . Myocardial Perfusion Imaging  . EKG 12-Lead  . Echocardiogram   Disposition:   FU one year  Signed, Sherren Mocha, MD  07/19/2014 5:53 AM    [facility] [place], [place], [place]  [address] Phone: [phone]; Fax: [phone]

## 2014-07-18 DIAGNOSIS — H02729 Madarosis of unspecified eye, unspecified eyelid and periocular area: Secondary | ICD-10-CM | POA: Diagnosis not present

## 2014-07-18 DIAGNOSIS — H2513 Age-related nuclear cataract, bilateral: Secondary | ICD-10-CM | POA: Diagnosis not present

## 2014-07-18 DIAGNOSIS — H04123 Dry eye syndrome of bilateral lacrimal glands: Secondary | ICD-10-CM | POA: Diagnosis not present

## 2014-07-19 ENCOUNTER — Encounter: Payer: Self-pay | Admitting: Cardiovascular Disease

## 2014-07-25 ENCOUNTER — Other Ambulatory Visit: Payer: Self-pay

## 2014-08-02 ENCOUNTER — Other Ambulatory Visit (HOSPITAL_COMMUNITY): Payer: Medicare Other

## 2014-08-02 ENCOUNTER — Encounter (HOSPITAL_COMMUNITY): Payer: Medicare Other

## 2014-08-09 ENCOUNTER — Telehealth (HOSPITAL_COMMUNITY): Payer: Self-pay | Admitting: *Deleted

## 2014-08-09 NOTE — Telephone Encounter (Signed)
Left message on voicemail in reference to upcoming appointment scheduled for 08/14/14. Phone number given for a call back so details instructions can be given. Hubbard Robinson, RN

## 2014-08-13 ENCOUNTER — Telehealth (HOSPITAL_COMMUNITY): Payer: Self-pay | Admitting: *Deleted

## 2014-08-13 NOTE — Telephone Encounter (Signed)
Patient given detailed instructions per Myocardial Perfusion Study Information Sheet for test on 08/14/14 at 9:00. Patient Notified to arrive 15 minutes early, and that it is imperative to arrive on time for appointment to keep from having the test rescheduled. Patient verbalized understanding. Veronia Beets

## 2014-08-14 ENCOUNTER — Ambulatory Visit (HOSPITAL_BASED_OUTPATIENT_CLINIC_OR_DEPARTMENT_OTHER): Payer: Medicare Other

## 2014-08-14 ENCOUNTER — Other Ambulatory Visit: Payer: Self-pay

## 2014-08-14 ENCOUNTER — Ambulatory Visit (HOSPITAL_COMMUNITY): Payer: Medicare Other | Attending: Cardiology

## 2014-08-14 DIAGNOSIS — R9439 Abnormal result of other cardiovascular function study: Secondary | ICD-10-CM | POA: Insufficient documentation

## 2014-08-14 DIAGNOSIS — R011 Cardiac murmur, unspecified: Secondary | ICD-10-CM

## 2014-08-14 DIAGNOSIS — R079 Chest pain, unspecified: Secondary | ICD-10-CM | POA: Diagnosis not present

## 2014-08-14 DIAGNOSIS — I10 Essential (primary) hypertension: Secondary | ICD-10-CM | POA: Insufficient documentation

## 2014-08-14 DIAGNOSIS — F17200 Nicotine dependence, unspecified, uncomplicated: Secondary | ICD-10-CM | POA: Insufficient documentation

## 2014-08-14 DIAGNOSIS — E785 Hyperlipidemia, unspecified: Secondary | ICD-10-CM | POA: Insufficient documentation

## 2014-08-14 LAB — MYOCARDIAL PERFUSION IMAGING
CHL CUP MPHR: 143 {beats}/min
CHL CUP RESTING HR STRESS: 52 {beats}/min
CSEPEDS: 30 s
CSEPEW: 7.5 METS
Exercise duration (min): 5 min
LV sys vol: 35 mL
LVDIAVOL: 85 mL
NUC STRESS TID: 0.89
Peak HR: 137 {beats}/min
Percent HR: 95 %
RATE: 0.22
SDS: 3
SRS: 5
SSS: 8

## 2014-08-14 MED ORDER — TECHNETIUM TC 99M SESTAMIBI GENERIC - CARDIOLITE
10.6000 | Freq: Once | INTRAVENOUS | Status: AC | PRN
  Administered 2014-08-14: 11 via INTRAVENOUS

## 2014-08-14 MED ORDER — TECHNETIUM TC 99M SESTAMIBI GENERIC - CARDIOLITE
32.4000 | Freq: Once | INTRAVENOUS | Status: AC | PRN
  Administered 2014-08-14: 32 via INTRAVENOUS

## 2014-08-17 ENCOUNTER — Other Ambulatory Visit: Payer: Self-pay

## 2014-08-17 MED ORDER — ATENOLOL 25 MG PO TABS: 25.0000 mg | ORAL_TABLET | Freq: Every day | ORAL | Status: DC

## 2014-08-17 MED ORDER — ATENOLOL 25 MG PO TABS: 25.0000 mg | ORAL_TABLET | Freq: Two times a day (BID) | ORAL | Status: DC

## 2014-08-21 DIAGNOSIS — C61 Malignant neoplasm of prostate: Secondary | ICD-10-CM | POA: Diagnosis not present

## 2014-08-23 ENCOUNTER — Encounter: Payer: Self-pay | Admitting: Cardiovascular Disease

## 2014-08-23 NOTE — Telephone Encounter (Signed)
New Message  Pt returning RN phone call. Please call back and discuss.   

## 2014-08-23 NOTE — Telephone Encounter (Signed)
This encounter was created in error - please disregard.

## 2014-08-27 ENCOUNTER — Encounter: Payer: Self-pay | Admitting: Cardiovascular Disease

## 2014-08-27 ENCOUNTER — Ambulatory Visit (INDEPENDENT_AMBULATORY_CARE_PROVIDER_SITE_OTHER): Payer: Medicare Other | Admitting: Cardiovascular Disease

## 2014-08-27 VITALS — BP 98/54 | HR 50 | Ht 72.0 in | Wt 182.1 lb

## 2014-08-27 DIAGNOSIS — R931 Abnormal findings on diagnostic imaging of heart and coronary circulation: Secondary | ICD-10-CM | POA: Diagnosis not present

## 2014-08-27 DIAGNOSIS — R9439 Abnormal result of other cardiovascular function study: Secondary | ICD-10-CM

## 2014-08-27 NOTE — Patient Instructions (Signed)
Medication Instructions:  Your physician has recommended you make the following change in your medication:  1. STOP Atenolol  Labwork: No new orders.   Testing/Procedures: No new orders.   Follow-Up: Your physician wants you to follow-up in: 6 MONTHS with Dr Burt Knack.  You will receive a reminder letter in the mail two months in advance. If you don't receive a letter, please call our office to schedule the follow-up appointment.   Any Other Special Instructions Will Be Listed Below (If Applicable).

## 2014-08-27 NOTE — Progress Notes (Signed)
Cardiology Office Note Date:  08/27/2014   ID:  Yassin Scales, DOB [birth year], MRN [id number]  PCP:  [name], MD  Cardiologist:  Sherren Mocha, MD    Chief Complaint  Patient presents with  . OTHER    Abnormal Stress Test    History of Present Illness: Ricky [name] is a 79 y.o. male who presents for follow-up of an abnormal stress test. The patient was evaluated last month. At that time he complained of a feeling of heart racing with physical activity. He also had left arm pain at times. Considering his history of lower extremity peripheral arterial disease, a stress nuclear scan was arranged. This demonstrated normal LV systolic function with a small inferolateral infarct at the base with mild peri-infarct ischemia. He returns today for further discussion. In my absence, he was started on a beta blocker because of the abnormal stress test.  The patient feels well. He has been physically active. He denies any further cardiac symptoms. He specifically denies chest pain, chest pressure, or left arm pain. 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 He denies shortness of breath, edema, or heart palpitations.  Past Medical History  Diagnosis Date  . HTN (hypertension)   . HLD (hyperlipidemia)   . Prostate cancer   . PVD (peripheral vascular disease)     Past Surgical History  Procedure Laterality Date  . Cholecystectomy    . Back surgery  1994    cervical    Current Outpatient Prescriptions  Medication Sig Dispense Refill  . cilostazol (PLETAL) 100 MG tablet Take 100 mg by mouth 2 (two) times daily.    Marland Kitchen lisinopril (PRINIVIL,ZESTRIL) 10 MG tablet Take 5 mg by mouth daily.    . metoCLOPramide (REGLAN) 10 MG tablet Take 10 mg by mouth at bedtime.  3  . simvastatin (ZOCOR) 40 MG tablet Take 40 mg by mouth at bedtime.      No current facility-administered medications for this visit.   Allergies:   Aspirin   Social  History:  The patient  reports that he quit smoking about 22 years ago. He does not have any smokeless tobacco history on file. He reports that he does not drink alcohol or use illicit drugs.   Family History:  The patient's  family history includes Coronary artery disease in an other family member; Heart attack in his brother, father, and mother; Heart disease in his brother, father, and mother.    ROS:  Please see the history of present illness.   All other systems are reviewed and negative.    PHYSICAL EXAM: VS:  BP 98/54 mmHg  Pulse 50  Ht 6' (1.829 m)  Wt 182 lb 1.9 oz (82.609 kg)  BMI 24.69 kg/m2 , BMI Body mass index is 24.69 kg/(m^2). GEN: Well nourished, well developed, in no acute distress Physical exam was deferred today as our time was spent in discussing results of recent testing.   EKG:  EKG is not ordered today.  Recent Labs: No results found for requested labs within last 365 days.   Lipid Panel  No results found for: CHOL, TRIG, HDL, CHOLHDL, VLDL, LDLCALC, LDLDIRECT    Wt Readings from Last 3 Encounters:  08/27/14 182 lb 1.9 oz (82.609 kg)  08/14/14 184 lb (83.462 kg)  07/17/14 184 lb 9.6 oz (83.734 kg)     Cardiac Studies Reviewed: Myoview: Study Highlights     Nuclear stress EF: 59%.  Blood pressure demonstrated a hypertensive response to exercise.  This is a low risk study.  Findings consistent with prior myocardial infarction with peri-infarct ischemia.  The left ventricular ejection fraction is normal (55-65%).  Small area of inferolateral wall infarct at mid and basal level with mild peri infarct ischemia EF 59%    Echo: Study Conclusions  - Left ventricle: The cavity size was normal. Wall thickness was normal. Systolic function was normal. The estimated ejection fraction was in the range of 55% to 60%. Wall motion was normal; there were no regional wall motion abnormalities. Doppler parameters are consistent with abnormal left  ventricular relaxation (grade 1 diastolic dysfunction). - Pulmonary arteries: PA peak pressure: 32 mm Hg (S).  Impressions:  - Normal LV function; grade 1 diastolic dysfunction; mild TR.  ASSESSMENT AND PLAN: Abnormal nuclear stress test: Reviewed potential treatment algorithms with the patient. He cannot take antiplatelets therapy because of recurrent problems of epistaxis. He seems to be tolerating a statin drug. A beta blocker was started after his abnormal stress test, but his blood pressure is too low. I recommended that we stop atenolol. He should continue on lisinopril 5 mg daily. No other changes were made to his medical program. Considering that he currently is asymptomatic, I did not recommend cardiac catheterization. The patient favors a conservative approach and I certainly think this is reasonable. I will see him back in 6 months. He will call if chest or left arm pain occurs.   Current medicines are reviewed with the patient today.  The patient does not have concerns regarding medicines.  Labs/ tests ordered today include:  No orders of the defined types were placed in this encounter.    Disposition:   FU 6 months or sooner if problems arise.   Time spent conducting this evaluation = 20 min over half spent with patient discussing potential diagnostic and treatment options.   Deatra Dontay, MD  08/27/2014 6:37 PM    [facility] [address], [place], [place]  [address] Phone: [phone]; Fax: [phone]

## 2014-09-12 DIAGNOSIS — R51 Headache: Secondary | ICD-10-CM | POA: Diagnosis not present

## 2014-09-12 DIAGNOSIS — I10 Essential (primary) hypertension: Secondary | ICD-10-CM | POA: Diagnosis not present

## 2014-09-12 DIAGNOSIS — M150 Primary generalized (osteo)arthritis: Secondary | ICD-10-CM | POA: Diagnosis not present

## 2014-09-12 DIAGNOSIS — I2510 Atherosclerotic heart disease of native coronary artery without angina pectoris: Secondary | ICD-10-CM | POA: Diagnosis not present

## 2014-11-08 DIAGNOSIS — I10 Essential (primary) hypertension: Secondary | ICD-10-CM | POA: Diagnosis not present

## 2014-11-08 DIAGNOSIS — E559 Vitamin D deficiency, unspecified: Secondary | ICD-10-CM | POA: Diagnosis not present

## 2014-11-08 DIAGNOSIS — I2510 Atherosclerotic heart disease of native coronary artery without angina pectoris: Secondary | ICD-10-CM | POA: Diagnosis not present

## 2014-11-08 DIAGNOSIS — R51 Headache: Secondary | ICD-10-CM | POA: Diagnosis not present

## 2014-11-08 DIAGNOSIS — I70209 Unspecified atherosclerosis of native arteries of extremities, unspecified extremity: Secondary | ICD-10-CM | POA: Diagnosis not present

## 2014-11-08 DIAGNOSIS — M150 Primary generalized (osteo)arthritis: Secondary | ICD-10-CM | POA: Diagnosis not present

## 2014-11-08 DIAGNOSIS — D400 Neoplasm of uncertain behavior of prostate: Secondary | ICD-10-CM | POA: Diagnosis not present

## 2015-01-29 DIAGNOSIS — C61 Malignant neoplasm of prostate: Secondary | ICD-10-CM | POA: Diagnosis not present

## 2015-01-31 DIAGNOSIS — H2513 Age-related nuclear cataract, bilateral: Secondary | ICD-10-CM | POA: Diagnosis not present

## 2015-01-31 DIAGNOSIS — H04123 Dry eye syndrome of bilateral lacrimal glands: Secondary | ICD-10-CM | POA: Diagnosis not present

## 2015-02-13 DIAGNOSIS — I2510 Atherosclerotic heart disease of native coronary artery without angina pectoris: Secondary | ICD-10-CM | POA: Diagnosis not present

## 2015-02-13 DIAGNOSIS — R51 Headache: Secondary | ICD-10-CM | POA: Diagnosis not present

## 2015-02-13 DIAGNOSIS — M150 Primary generalized (osteo)arthritis: Secondary | ICD-10-CM | POA: Diagnosis not present

## 2015-02-13 DIAGNOSIS — I10 Essential (primary) hypertension: Secondary | ICD-10-CM | POA: Diagnosis not present

## 2015-02-19 DIAGNOSIS — G609 Hereditary and idiopathic neuropathy, unspecified: Secondary | ICD-10-CM | POA: Diagnosis not present

## 2015-02-19 DIAGNOSIS — M79675 Pain in left toe(s): Secondary | ICD-10-CM | POA: Diagnosis not present

## 2015-02-19 DIAGNOSIS — M792 Neuralgia and neuritis, unspecified: Secondary | ICD-10-CM | POA: Diagnosis not present

## 2015-02-19 DIAGNOSIS — M79674 Pain in right toe(s): Secondary | ICD-10-CM | POA: Diagnosis not present

## 2015-02-19 DIAGNOSIS — M2570 Osteophyte, unspecified joint: Secondary | ICD-10-CM | POA: Diagnosis not present

## 2015-03-14 ENCOUNTER — Other Ambulatory Visit: Payer: Self-pay | Admitting: Urology

## 2015-03-14 DIAGNOSIS — N139 Obstructive and reflux uropathy, unspecified: Secondary | ICD-10-CM | POA: Diagnosis not present

## 2015-03-14 DIAGNOSIS — Z0000 Encounter for general adult medical examination without abnormal findings: Secondary | ICD-10-CM | POA: Diagnosis not present

## 2015-03-14 DIAGNOSIS — C61 Malignant neoplasm of prostate: Secondary | ICD-10-CM

## 2015-03-14 DIAGNOSIS — R300 Dysuria: Secondary | ICD-10-CM | POA: Diagnosis not present

## 2015-03-19 DIAGNOSIS — L600 Ingrowing nail: Secondary | ICD-10-CM | POA: Diagnosis not present

## 2015-03-19 DIAGNOSIS — M2570 Osteophyte, unspecified joint: Secondary | ICD-10-CM | POA: Diagnosis not present

## 2015-03-19 DIAGNOSIS — D485 Neoplasm of uncertain behavior of skin: Secondary | ICD-10-CM | POA: Diagnosis not present

## 2015-03-19 DIAGNOSIS — M79675 Pain in left toe(s): Secondary | ICD-10-CM | POA: Diagnosis not present

## 2015-03-24 NOTE — Progress Notes (Signed)
This encounter was created in error - please disregard.

## 2015-03-25 ENCOUNTER — Encounter: Payer: Medicare Other | Admitting: Cardiovascular Disease

## 2015-03-27 ENCOUNTER — Encounter: Payer: Self-pay | Admitting: Cardiovascular Disease

## 2015-04-03 ENCOUNTER — Ambulatory Visit (HOSPITAL_COMMUNITY)
Admission: RE | Admit: 2015-04-03 | Discharge: 2015-04-03 | Disposition: A | Discharge status: Home / Self Care | Payer: Medicare Other | Source: Ambulatory Visit | Attending: Urology | Admitting: Urology

## 2015-04-03 DIAGNOSIS — C61 Malignant neoplasm of prostate: Secondary | ICD-10-CM | POA: Diagnosis not present

## 2015-04-03 DIAGNOSIS — N400 Enlarged prostate without lower urinary tract symptoms: Secondary | ICD-10-CM | POA: Insufficient documentation

## 2015-04-03 LAB — POCT I-STAT CREATININE: Creatinine, Ser: 0.9 mg/dL (ref 0.61–1.24)

## 2015-04-03 MED ORDER — GADOBENATE DIMEGLUMINE 529 MG/ML IV SOLN
20.0000 mL | Freq: Once | INTRAVENOUS | Status: AC | PRN
  Administered 2015-04-03: 19 mL via INTRAVENOUS

## 2015-04-08 DIAGNOSIS — R300 Dysuria: Secondary | ICD-10-CM | POA: Diagnosis not present

## 2015-04-08 DIAGNOSIS — N139 Obstructive and reflux uropathy, unspecified: Secondary | ICD-10-CM | POA: Diagnosis not present

## 2015-04-29 DIAGNOSIS — L03031 Cellulitis of right toe: Secondary | ICD-10-CM | POA: Diagnosis not present

## 2015-04-30 DIAGNOSIS — M150 Primary generalized (osteo)arthritis: Secondary | ICD-10-CM | POA: Diagnosis not present

## 2015-04-30 DIAGNOSIS — I2510 Atherosclerotic heart disease of native coronary artery without angina pectoris: Secondary | ICD-10-CM | POA: Diagnosis not present

## 2015-04-30 DIAGNOSIS — I10 Essential (primary) hypertension: Secondary | ICD-10-CM | POA: Diagnosis not present

## 2015-06-10 DIAGNOSIS — J40 Bronchitis, not specified as acute or chronic: Secondary | ICD-10-CM | POA: Diagnosis not present

## 2015-06-10 DIAGNOSIS — I2510 Atherosclerotic heart disease of native coronary artery without angina pectoris: Secondary | ICD-10-CM | POA: Diagnosis not present

## 2015-06-10 DIAGNOSIS — H81399 Other peripheral vertigo, unspecified ear: Secondary | ICD-10-CM | POA: Diagnosis not present

## 2015-06-10 DIAGNOSIS — I10 Essential (primary) hypertension: Secondary | ICD-10-CM | POA: Diagnosis not present

## 2015-06-29 ENCOUNTER — Emergency Department (HOSPITAL_COMMUNITY)
Admission: EM | Admit: 2015-06-29 | Discharge: 2015-06-30 | Disposition: A | Discharge status: Home / Self Care | Payer: Medicare Other | Attending: Emergency Medicine | Admitting: Emergency Medicine

## 2015-06-29 ENCOUNTER — Emergency Department (HOSPITAL_COMMUNITY): Payer: Medicare Other

## 2015-06-29 ENCOUNTER — Encounter (HOSPITAL_COMMUNITY): Payer: Self-pay | Admitting: Emergency Medicine

## 2015-06-29 DIAGNOSIS — Z87891 Personal history of nicotine dependence: Secondary | ICD-10-CM | POA: Diagnosis not present

## 2015-06-29 DIAGNOSIS — Z8546 Personal history of malignant neoplasm of prostate: Secondary | ICD-10-CM | POA: Insufficient documentation

## 2015-06-29 DIAGNOSIS — E785 Hyperlipidemia, unspecified: Secondary | ICD-10-CM | POA: Diagnosis not present

## 2015-06-29 DIAGNOSIS — Z79899 Other long term (current) drug therapy: Secondary | ICD-10-CM | POA: Insufficient documentation

## 2015-06-29 DIAGNOSIS — R531 Weakness: Secondary | ICD-10-CM | POA: Diagnosis not present

## 2015-06-29 DIAGNOSIS — R404 Transient alteration of awareness: Secondary | ICD-10-CM | POA: Diagnosis not present

## 2015-06-29 DIAGNOSIS — I10 Essential (primary) hypertension: Secondary | ICD-10-CM | POA: Insufficient documentation

## 2015-06-29 DIAGNOSIS — I959 Hypotension, unspecified: Secondary | ICD-10-CM | POA: Diagnosis not present

## 2015-06-29 DIAGNOSIS — R42 Dizziness and giddiness: Secondary | ICD-10-CM | POA: Diagnosis not present

## 2015-06-29 DIAGNOSIS — R031 Nonspecific low blood-pressure reading: Secondary | ICD-10-CM | POA: Diagnosis not present

## 2015-06-29 DIAGNOSIS — R4182 Altered mental status, unspecified: Secondary | ICD-10-CM | POA: Diagnosis not present

## 2015-06-29 LAB — CBC WITH DIFFERENTIAL/PLATELET
BASOS ABS: 0.1 10*3/uL (ref 0.0–0.1)
BASOS PCT: 1 %
Eosinophils Absolute: 0.2 10*3/uL (ref 0.0–0.7)
Eosinophils Relative: 3 %
HEMATOCRIT: 36.4 % — AB (ref 39.0–52.0)
HEMOGLOBIN: 12.9 g/dL — AB (ref 13.0–17.0)
Lymphocytes Relative: 28 %
Lymphs Abs: 2.3 10*3/uL (ref 0.7–4.0)
MCH: 28.5 pg (ref 26.0–34.0)
MCHC: 35.4 g/dL (ref 30.0–36.0)
MCV: 80.4 fL (ref 78.0–100.0)
MONO ABS: 0.6 10*3/uL (ref 0.1–1.0)
Monocytes Relative: 8 %
NEUTROS ABS: 5 10*3/uL (ref 1.7–7.7)
NEUTROS PCT: 60 %
Platelets: 161 10*3/uL (ref 150–400)
RBC: 4.53 MIL/uL (ref 4.22–5.81)
RDW: 15.5 % (ref 11.5–15.5)
WBC: 8.1 10*3/uL (ref 4.0–10.5)

## 2015-06-29 LAB — BASIC METABOLIC PANEL
ANION GAP: 8 (ref 5–15)
BUN: 28 mg/dL — ABNORMAL HIGH (ref 6–20)
CALCIUM: 9.3 mg/dL (ref 8.9–10.3)
CO2: 24 mmol/L (ref 22–32)
Chloride: 107 mmol/L (ref 101–111)
Creatinine, Ser: 1.55 mg/dL — ABNORMAL HIGH (ref 0.61–1.24)
GFR, EST AFRICAN AMERICAN: 48 mL/min — AB (ref >60)
GFR, EST NON AFRICAN AMERICAN: 41 mL/min — AB (ref >60)
Glucose, Bld: 146 mg/dL — ABNORMAL HIGH (ref 65–99)
Potassium: 3.9 mmol/L (ref 3.5–5.1)
Sodium: 139 mmol/L (ref 135–145)

## 2015-06-29 LAB — I-STAT TROPONIN, ED: Troponin i, poc: 0.02 ng/mL (ref 0.00–0.08)

## 2015-06-29 MED ORDER — SODIUM CHLORIDE 0.9 % IV BOLUS (SEPSIS)
1000.0000 mL | Freq: Once | INTRAVENOUS | Status: AC
  Administered 2015-06-29: 1000 mL via INTRAVENOUS

## 2015-06-29 MED ORDER — MECLIZINE HCL 25 MG PO TABS
25.0000 mg | ORAL_TABLET | Freq: Once | ORAL | Status: AC
  Administered 2015-06-29: 25 mg via ORAL
  Filled 2015-06-29: qty 1

## 2015-06-29 NOTE — ED Notes (Signed)
Bed: [id number] Expected date: 06/29/15 Expected time: 6:52 PM Means of arrival:  Comments: Hypotension

## 2015-06-29 NOTE — ED Notes (Signed)
Pt is unable to urinate at this time. 

## 2015-06-29 NOTE — ED Provider Notes (Signed)
CSN: [id number]     Arrival date & time 06/29/15  1854 History   First MD Initiated Contact with Patient 06/29/15 1902     Chief Complaint  Patient presents with  . Weakness  . Dizziness  . Hypotension   HPI  Ricky [name] is a 80 year old male with past medical history of hypertension, hyperlipidemia, peripheral vascular disease and prostate cancer presenting with headache and dizziness. Patient states he woke with sensation of "swimmy headedness" which has been present all day. He states the sensation is like his head is spinning. Sensation is constant but worsens with tilting his head back and looking around. He states that this sensation makes him slightly uneasy on his feet at times. Denies falling or near syncope. He took his blood pressure at home and noted that the systolic pressure was in the 80s. He took his pressure 4 more times and it stayed in the 80s. He then called EMS who recorded a systolic of 81. He was given a 250 cc bolus in route and reports this somewhat improved his dizziness but he is now experiencing a headache and blurred vision. He states that his vision is blurred in both of his eyes. The headache is frontal and throbbing. He states that it came on gradually. He denies history of vision problems or similar headaches. His BP improved to 117/63 after the initial 250 cc bolus. Denies fevers, chills, neck pain, congestion, rhinorrhea, sore throat, chest pain, palpitations, SOB, abdominal pain, nausea, vomiting, diarrhea, constipation, dysuria, hematuria or rashes.   Past Medical History  Diagnosis Date  . HTN (hypertension)   . HLD (hyperlipidemia)   . Prostate cancer ([place])   . PVD (peripheral vascular disease) [facility])    Past Surgical History  Procedure Laterality Date  . Cholecystectomy    . Back surgery  1994    cervical   Family History  Problem Relation Age of Onset  . Coronary artery disease    . Heart disease Mother   . Heart attack Mother   . Heart disease  Father   . Heart attack Father   . Heart disease Brother   . Heart attack Brother    Social History  Substance Use Topics  . Smoking status: Former Smoker    Quit date: 02/03/1992  . Smokeless tobacco: None  . Alcohol Use: No    Review of Systems  All other systems reviewed and are negative.     Allergies  Aspirin  Home Medications   Prior to Admission medications   Medication Sig Start Date End Date Taking? Authorizing Provider  cilostazol (PLETAL) 100 MG tablet Take 100 mg by mouth 2 (two) times daily.    Historical Provider, MD  lisinopril (PRINIVIL,ZESTRIL) 10 MG tablet Take 5 mg by mouth daily.    Historical Provider, MD  metoCLOPramide (REGLAN) 10 MG tablet Take 10 mg by mouth at bedtime. 07/04/14   Historical Provider, MD  simvastatin (ZOCOR) 40 MG tablet Take 40 mg by mouth at bedtime.     Historical Provider, MD   BP 112/64 mmHg  Pulse 72  Temp(Src) 97.6 F (36.4 C) (Oral)  Resp 14  SpO2 94% Physical Exam  Constitutional: He is oriented to person, place, and time. He appears well-developed and well-nourished. No distress.  HENT:  Head: Normocephalic and atraumatic.  Mouth/Throat: Oropharynx is clear and moist. No oropharyngeal exudate.  Eyes: Conjunctivae and EOM are normal. Pupils are equal, round, and reactive to light. Right eye exhibits no discharge. Left eye  exhibits no discharge.  Neck: Normal range of motion. Neck supple. No rigidity.  No meningeal signs  Cardiovascular: Normal rate, regular rhythm and normal heart sounds.   Pulmonary/Chest: Effort normal and breath sounds normal. No respiratory distress.  Abdominal: Soft. There is no tenderness. There is no rebound and no guarding.  Musculoskeletal: Normal range of motion.  Neurological: He is alert and oriented to person, place, and time. No cranial nerve deficit. He exhibits normal muscle tone. Coordination normal.  Cranial nerves 3-12 tested and intact. 5/5 strength of all major muscle groups.  Sensation to light touch intact throughout. Finger to nose coordinated.  Skin: Skin is warm and dry.  Psychiatric: He has a normal mood and affect. His behavior is normal.  Nursing note and vitals reviewed.   ED Course  Procedures (including critical care time) Labs Review Labs Reviewed  CBC WITH DIFFERENTIAL/PLATELET - Abnormal; Notable for the following:    Hemoglobin 12.9 (*)    HCT 36.4 (*)    All other components within normal limits  BASIC METABOLIC PANEL - Abnormal; Notable for the following:    Glucose, Bld 146 (*)    BUN 28 (*)    Creatinine, Ser 1.55 (*)    GFR calc non Af Amer 41 (*)    GFR calc Af Amer 48 (*)    All other components within normal limits  URINALYSIS, ROUTINE W REFLEX MICROSCOPIC (NOT AT [facility])  I-STAT TROPOININ, ED    Imaging Review Ct Head Wo Contrast  06/29/2015  CLINICAL DATA:  Patient with abnormal sensation, altered mental status. EXAM: CT HEAD WITHOUT CONTRAST TECHNIQUE: Contiguous axial images were obtained from the base of the skull through the vertex without intravenous contrast. COMPARISON:  MRI brain 12/28/2011; CT brain 09/01/2006. 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 No evidence for acute cortically based infarct, intracranial hemorrhage, mass lesion or mass-effect. Orbits are unremarkable. Paranasal sinuses are well aerated. Mastoid air cells unremarkable. Calvarium is intact. IMPRESSION: No acute intracranial process. 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 Electronically Signed   By: Lovey Newcomer M.D.   On: 06/29/2015 20:30   I have personally reviewed and evaluated these images and lab results as part of my medical decision-making.   EKG Interpretation   Date/Time:  Saturday Jun 29 2015 19:26:54 EDT Ventricular Rate:  68 PR Interval:  189 QRS Duration: 77 QT Interval:  374 QTC Calculation: 398 R Axis:   -20 Text Interpretation:  Sinus rhythm Borderline left axis deviation , new  since last tracing  otherwise unchanged Confirmed by KNAPP  MD-J, JON  [id number]) on 06/29/2015 7:32:27 PM      MDM   Final diagnoses:  Dizziness   80 year old male presenting with dizziness x 1 day and headache with blurred vision since presentation to ED. Originally hypotensive with EMS in systolic [id number]. Given 250 cc bolus which improved BP to 112/64 in ED. Afebrile. No tachycardia. Non-focal neuro exam. Heart RRR. Lungs CTAB. No leukocytosis. Creatinine elevated from baseline to 1.55. Troponin negative with non-ischemic ECG. CT head negative for acute process. Does note old right MCA infarct. Pending brain MRI for further evaluation. Pt receiving bolus of IV fluids and meclizine currently. Discussed case with attending Dr. Tomi Bamberger who agrees with current plan of evaluation. Patient care signed out to oncoming provider, Antonietta Breach, PA-C, at shift change who will follow BPs, UA and MRI results and dispo accordingly.     Ricky Gip, PA-C 06/29/15 2059  Dorie Rank,  MD 06/29/15 2102

## 2015-06-29 NOTE — ED Notes (Signed)
Per EMS- Patient stated he began feeling "swimmy headed" today and took his BP at hhome. Patient stated sys BP 89. Upon arrival, EMS stated Systolic pressure 81. Patient was given 250 ml NS and BP was 117/63.

## 2015-06-30 LAB — URINALYSIS, ROUTINE W REFLEX MICROSCOPIC
Bilirubin Urine: NEGATIVE
Glucose, UA: NEGATIVE mg/dL
Ketones, ur: NEGATIVE mg/dL
LEUKOCYTES UA: NEGATIVE
NITRITE: NEGATIVE
PH: 5 (ref 5.0–8.0)
Protein, ur: NEGATIVE mg/dL
SPECIFIC GRAVITY, URINE: 1.024 (ref 1.005–1.030)

## 2015-06-30 LAB — URINE MICROSCOPIC-ADD ON
Bacteria, UA: NONE SEEN
RBC / HPF: NONE SEEN RBC/hpf (ref 0–5)

## 2015-06-30 MED ORDER — MECLIZINE HCL 25 MG PO TABS: 25.0000 mg | ORAL_TABLET | Freq: Three times a day (TID) | ORAL | Status: DC | PRN

## 2015-06-30 NOTE — ED Provider Notes (Signed)
12:35 AM Patient with negative MRI; no acute findings. He states that he still has an abnormal sensation in his head, but reports that this has been ongoing x 3-4 years. Given chronicity of symptoms and reassuring work up, no indication for further emergent testing. Plan to d/c with Meclizine. Plan discussed with patient and family who are agreeable to plan with no unaddressed concerns.   Filed Vitals:   06/29/15 2100 06/29/15 2130 06/29/15 2200 06/29/15 2330  BP: 116/73 134/76 119/76 131/74  Pulse: 66  59 66  Temp:      TempSrc:      Resp: 14 15 18 17   SpO2: 96%  97% 96%    Results for orders placed or performed during the hospital encounter of 06/29/15  CBC with Differential  Result Value Ref Range   WBC 8.1 4.0 - 10.5 K/uL   RBC 4.53 4.22 - 5.81 MIL/uL   Hemoglobin 12.9 (L) 13.0 - 17.0 g/dL   HCT 36.4 (L) 39.0 - 52.0 %   MCV 80.4 78.0 - 100.0 fL   MCH 28.5 26.0 - 34.0 pg   MCHC 35.4 30.0 - 36.0 g/dL   RDW 15.5 11.5 - 15.5 %   Platelets 161 150 - 400 K/uL   Neutrophils Relative % 60 %   Neutro Abs 5.0 1.7 - 7.7 K/uL   Lymphocytes Relative 28 %   Lymphs Abs 2.3 0.7 - 4.0 K/uL   Monocytes Relative 8 %   Monocytes Absolute 0.6 0.1 - 1.0 K/uL   Eosinophils Relative 3 %   Eosinophils Absolute 0.2 0.0 - 0.7 K/uL   Basophils Relative 1 %   Basophils Absolute 0.1 0.0 - 0.1 K/uL  Basic metabolic panel  Result Value Ref Range   Sodium 139 135 - 145 mmol/L   Potassium 3.9 3.5 - 5.1 mmol/L   Chloride 107 101 - 111 mmol/L   CO2 24 22 - 32 mmol/L   Glucose, Bld 146 (H) 65 - 99 mg/dL   BUN 28 (H) 6 - 20 mg/dL   Creatinine, Ser 1.55 (H) 0.61 - 1.24 mg/dL   Calcium 9.3 8.9 - 10.3 mg/dL   GFR calc non Af Amer 41 (L) >60 mL/min   GFR calc Af Amer 48 (L) >60 mL/min   Anion gap 8 5 - 15  I-Stat Troponin, ED (not at [facility])  Result Value Ref Range   Troponin i, poc 0.02 0.00 - 0.08 ng/mL   Comment 3           Ct Head Wo Contrast  06/29/2015  CLINICAL DATA:  Patient with abnormal  sensation, altered mental status. EXAM: CT HEAD WITHOUT CONTRAST TECHNIQUE: Contiguous axial images were obtained from the base of the skull through the vertex without intravenous contrast. COMPARISON:  MRI brain 12/28/2011; CT brain 09/01/2006. 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 No evidence for acute cortically based infarct, intracranial hemorrhage, mass lesion or mass-effect. Orbits are unremarkable. Paranasal sinuses are well aerated. Mastoid air cells unremarkable. Calvarium is intact. IMPRESSION: No acute intracranial process. 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 Electronically Signed   By: Lovey Newcomer M.D.   On: 06/29/2015 20:30   Mr Brain Wo Contrast  06/29/2015  CLINICAL DATA:  Initial evaluation for acute dizziness, blurry vision. EXAM: MRI HEAD WITHOUT CONTRAST TECHNIQUE: Multiplanar, multiecho pulse sequences of the brain and surrounding structures were obtained without intravenous contrast. COMPARISON:  Prior CT from earlier same day. FINDINGS: Diffuse prominence of the CSF containing spaces is  compatible with generalized cerebral atrophy. Patchy and confluent T2/FLAIR hyperintensity within the periventricular and deep white matter both cerebral hemispheres most consistent with chronic small vessel ischemic disease. These changes are mildly progressed relative to 2013. Patchy encephalomalacia with gliosis related to remote right MCA territory infarct noted, stable. No abnormal foci of restricted diffusion suggest acute intracranial infarct. Gray-white matter differentiation maintained. Normal intracranial vascular flow voids are preserved. No acute or chronic intracranial hemorrhage. No mass lesion, midline shift, or mass effect. No hydrocephalus. No extra-axial fluid collection. Major dural sinuses are grossly patent. Craniocervical junction within normal limits. Visualized upper cervical spine unremarkable. Pituitary gland within normal limits. No acute abnormality  about the orbits. Mild scattered mucosal thickening within the ethmoidal air cells. Paranasal sinuses are otherwise clear. No mastoid effusion. Inner ear structures grossly normal. Bone marrow signal intensity within normal limits. No scalp soft tissue abnormality. IMPRESSION: 1. No acute intracranial process identified. 2. Remote right MCA territory infarct. 3. Mild age-related cerebral atrophy with chronic small vessel ischemic disease, mildly progressed relative to 2013. Electronically Signed   By: Jeannine Boga M.D.   On: 06/29/2015 23:37      Antonietta Breach, PA-C 06/30/15 [id number]  Leo Grosser, MD 06/30/15 [phone]

## 2015-06-30 NOTE — Discharge Instructions (Signed)

## 2015-06-30 NOTE — ED Notes (Signed)
Pt able to get out of bed and ambulate without assistance, with gait steady but still complained, "My head feels like it's going in and out, in and out".

## 2015-07-03 ENCOUNTER — Inpatient Hospital Stay (HOSPITAL_COMMUNITY): Payer: Medicare Other

## 2015-07-03 ENCOUNTER — Emergency Department (HOSPITAL_COMMUNITY): Payer: Medicare Other

## 2015-07-03 ENCOUNTER — Observation Stay (HOSPITAL_COMMUNITY)
Admission: EM | Admit: 2015-07-03 | Discharge: 2015-07-05 | Disposition: A | Discharge status: Home / Self Care | Payer: Medicare Other | Attending: Internal Medicine | Admitting: Internal Medicine

## 2015-07-03 ENCOUNTER — Encounter (HOSPITAL_COMMUNITY): Payer: Self-pay | Admitting: *Deleted

## 2015-07-03 DIAGNOSIS — Z8546 Personal history of malignant neoplasm of prostate: Secondary | ICD-10-CM | POA: Diagnosis not present

## 2015-07-03 DIAGNOSIS — I440 Atrioventricular block, first degree: Secondary | ICD-10-CM | POA: Diagnosis not present

## 2015-07-03 DIAGNOSIS — T464X5A Adverse effect of angiotensin-converting-enzyme inhibitors, initial encounter: Secondary | ICD-10-CM | POA: Insufficient documentation

## 2015-07-03 DIAGNOSIS — Z8673 Personal history of transient ischemic attack (TIA), and cerebral infarction without residual deficits: Secondary | ICD-10-CM | POA: Insufficient documentation

## 2015-07-03 DIAGNOSIS — G9340 Encephalopathy, unspecified: Secondary | ICD-10-CM | POA: Diagnosis present

## 2015-07-03 DIAGNOSIS — S0990XA Unspecified injury of head, initial encounter: Secondary | ICD-10-CM | POA: Diagnosis not present

## 2015-07-03 DIAGNOSIS — G94 Other disorders of brain in diseases classified elsewhere: Secondary | ICD-10-CM | POA: Insufficient documentation

## 2015-07-03 DIAGNOSIS — R6510 Systemic inflammatory response syndrome (SIRS) of non-infectious origin without acute organ dysfunction: Principal | ICD-10-CM | POA: Insufficient documentation

## 2015-07-03 DIAGNOSIS — R0902 Hypoxemia: Secondary | ICD-10-CM | POA: Diagnosis not present

## 2015-07-03 DIAGNOSIS — E785 Hyperlipidemia, unspecified: Secondary | ICD-10-CM | POA: Diagnosis not present

## 2015-07-03 DIAGNOSIS — R197 Diarrhea, unspecified: Secondary | ICD-10-CM | POA: Diagnosis not present

## 2015-07-03 DIAGNOSIS — N179 Acute kidney failure, unspecified: Secondary | ICD-10-CM | POA: Diagnosis present

## 2015-07-03 DIAGNOSIS — N400 Enlarged prostate without lower urinary tract symptoms: Secondary | ICD-10-CM | POA: Insufficient documentation

## 2015-07-03 DIAGNOSIS — R6889 Other general symptoms and signs: Secondary | ICD-10-CM | POA: Diagnosis not present

## 2015-07-03 DIAGNOSIS — I10 Essential (primary) hypertension: Secondary | ICD-10-CM | POA: Insufficient documentation

## 2015-07-03 DIAGNOSIS — I959 Hypotension, unspecified: Secondary | ICD-10-CM | POA: Insufficient documentation

## 2015-07-03 DIAGNOSIS — I952 Hypotension due to drugs: Secondary | ICD-10-CM | POA: Diagnosis not present

## 2015-07-03 DIAGNOSIS — I9589 Other hypotension: Secondary | ICD-10-CM

## 2015-07-03 DIAGNOSIS — Y929 Unspecified place or not applicable: Secondary | ICD-10-CM | POA: Insufficient documentation

## 2015-07-03 DIAGNOSIS — I739 Peripheral vascular disease, unspecified: Secondary | ICD-10-CM | POA: Insufficient documentation

## 2015-07-03 DIAGNOSIS — Z87891 Personal history of nicotine dependence: Secondary | ICD-10-CM | POA: Diagnosis not present

## 2015-07-03 DIAGNOSIS — E872 Acidosis, unspecified: Secondary | ICD-10-CM | POA: Insufficient documentation

## 2015-07-03 DIAGNOSIS — R51 Headache: Secondary | ICD-10-CM | POA: Diagnosis not present

## 2015-07-03 DIAGNOSIS — R031 Nonspecific low blood-pressure reading: Secondary | ICD-10-CM | POA: Diagnosis not present

## 2015-07-03 DIAGNOSIS — A419 Sepsis, unspecified organism: Secondary | ICD-10-CM | POA: Diagnosis present

## 2015-07-03 DIAGNOSIS — E860 Dehydration: Secondary | ICD-10-CM | POA: Diagnosis present

## 2015-07-03 DIAGNOSIS — Z79899 Other long term (current) drug therapy: Secondary | ICD-10-CM | POA: Insufficient documentation

## 2015-07-03 DIAGNOSIS — Z9049 Acquired absence of other specified parts of digestive tract: Secondary | ICD-10-CM | POA: Diagnosis not present

## 2015-07-03 DIAGNOSIS — Z8249 Family history of ischemic heart disease and other diseases of the circulatory system: Secondary | ICD-10-CM | POA: Insufficient documentation

## 2015-07-03 DIAGNOSIS — Z886 Allergy status to analgesic agent status: Secondary | ICD-10-CM | POA: Insufficient documentation

## 2015-07-03 LAB — POC OCCULT BLOOD, ED: FECAL OCCULT BLD: NEGATIVE

## 2015-07-03 LAB — COMPREHENSIVE METABOLIC PANEL
ALK PHOS: 58 U/L (ref 38–126)
ALT: 30 U/L (ref 17–63)
ANION GAP: 9 (ref 5–15)
AST: 30 U/L (ref 15–41)
Albumin: 3.1 g/dL — ABNORMAL LOW (ref 3.5–5.0)
BILIRUBIN TOTAL: 0.4 mg/dL (ref 0.3–1.2)
BUN: 30 mg/dL — ABNORMAL HIGH (ref 6–20)
CALCIUM: 8.2 mg/dL — AB (ref 8.9–10.3)
CO2: 20 mmol/L — ABNORMAL LOW (ref 22–32)
Chloride: 109 mmol/L (ref 101–111)
Creatinine, Ser: 3.42 mg/dL — ABNORMAL HIGH (ref 0.61–1.24)
GFR, EST AFRICAN AMERICAN: 18 mL/min — AB (ref >60)
GFR, EST NON AFRICAN AMERICAN: 16 mL/min — AB (ref >60)
GLUCOSE: 198 mg/dL — AB (ref 65–99)
Potassium: 4.5 mmol/L (ref 3.5–5.1)
Sodium: 138 mmol/L (ref 135–145)
TOTAL PROTEIN: 5.7 g/dL — AB (ref 6.5–8.1)

## 2015-07-03 LAB — CBC WITH DIFFERENTIAL/PLATELET
BASOS ABS: 0 10*3/uL (ref 0.0–0.1)
BASOS PCT: 0 %
Eosinophils Absolute: 0.1 10*3/uL (ref 0.0–0.7)
Eosinophils Relative: 2 %
HEMATOCRIT: 35.4 % — AB (ref 39.0–52.0)
HEMOGLOBIN: 12.3 g/dL — AB (ref 13.0–17.0)
Lymphocytes Relative: 17 %
Lymphs Abs: 1.5 10*3/uL (ref 0.7–4.0)
MCH: 28 pg (ref 26.0–34.0)
MCHC: 34.7 g/dL (ref 30.0–36.0)
MCV: 80.6 fL (ref 78.0–100.0)
MONOS PCT: 6 %
Monocytes Absolute: 0.5 10*3/uL (ref 0.1–1.0)
NEUTROS ABS: 6.3 10*3/uL (ref 1.7–7.7)
NEUTROS PCT: 75 %
Platelets: 124 10*3/uL — ABNORMAL LOW (ref 150–400)
RBC: 4.39 MIL/uL (ref 4.22–5.81)
RDW: 15.5 % (ref 11.5–15.5)
WBC: 8.5 10*3/uL (ref 4.0–10.5)

## 2015-07-03 LAB — URINALYSIS, ROUTINE W REFLEX MICROSCOPIC
Bilirubin Urine: NEGATIVE
Glucose, UA: NEGATIVE mg/dL
KETONES UR: 15 mg/dL — AB
LEUKOCYTES UA: NEGATIVE
NITRITE: NEGATIVE
PH: 5.5 (ref 5.0–8.0)
Protein, ur: 30 mg/dL — AB
Specific Gravity, Urine: 1.017 (ref 1.005–1.030)

## 2015-07-03 LAB — I-STAT CG4 LACTIC ACID, ED
LACTIC ACID, VENOUS: 3.32 mmol/L — AB (ref 0.5–2.0)
Lactic Acid, Venous: 3.35 mmol/L (ref 0.5–2.0)

## 2015-07-03 LAB — I-STAT CHEM 8, ED
BUN: 31 mg/dL — ABNORMAL HIGH (ref 6–20)
CALCIUM ION: 1.05 mmol/L — AB (ref 1.13–1.30)
Chloride: 107 mmol/L (ref 101–111)
Creatinine, Ser: 3.4 mg/dL — ABNORMAL HIGH (ref 0.61–1.24)
Glucose, Bld: 196 mg/dL — ABNORMAL HIGH (ref 65–99)
HEMATOCRIT: 37 % — AB (ref 39.0–52.0)
Hemoglobin: 12.6 g/dL — ABNORMAL LOW (ref 13.0–17.0)
Potassium: 4.5 mmol/L (ref 3.5–5.1)
SODIUM: 142 mmol/L (ref 135–145)
TCO2: 20 mmol/L (ref 0–100)

## 2015-07-03 LAB — PROTIME-INR
INR: 1.16 (ref 0.00–1.49)
INR: 1.13 (ref 0.00–1.49)
PROTHROMBIN TIME: 15 s (ref 11.6–15.2)
Prothrombin Time: 14.7 seconds (ref 11.6–15.2)

## 2015-07-03 LAB — URINE MICROSCOPIC-ADD ON
Bacteria, UA: NONE SEEN
SQUAMOUS EPITHELIAL / LPF: NONE SEEN

## 2015-07-03 LAB — RAPID URINE DRUG SCREEN, HOSP PERFORMED
Amphetamines: NOT DETECTED
Barbiturates: NOT DETECTED
Benzodiazepines: NOT DETECTED
Cocaine: NOT DETECTED
OPIATES: NOT DETECTED
TETRAHYDROCANNABINOL: NOT DETECTED

## 2015-07-03 LAB — LACTIC ACID, PLASMA
LACTIC ACID, VENOUS: 1.9 mmol/L (ref 0.5–2.0)
Lactic Acid, Venous: 3.3 mmol/L (ref 0.5–2.0)

## 2015-07-03 LAB — ETHANOL: Alcohol, Ethyl (B): <5 mg/dL (ref <5)

## 2015-07-03 LAB — I-STAT TROPONIN, ED: TROPONIN I, POC: 0.04 ng/mL (ref 0.00–0.08)

## 2015-07-03 LAB — APTT
aPTT: 39 seconds — ABNORMAL HIGH (ref 24–37)
aPTT: 31 seconds (ref 24–37)

## 2015-07-03 LAB — PROCALCITONIN: Procalcitonin: 0.16 ng/mL

## 2015-07-03 MED ORDER — SODIUM CHLORIDE 0.9 % IV SOLN
INTRAVENOUS | Status: DC
  Administered 2015-07-03 – 2015-07-05 (×4): via INTRAVENOUS

## 2015-07-03 MED ORDER — METRONIDAZOLE IN NACL 5-0.79 MG/ML-% IV SOLN
500.0000 mg | Freq: Three times a day (TID) | INTRAVENOUS | Status: DC
  Administered 2015-07-03 – 2015-07-05 (×5): 500 mg via INTRAVENOUS
  Filled 2015-07-03 (×6): qty 100

## 2015-07-03 MED ORDER — SODIUM CHLORIDE 0.9 % IV BOLUS (SEPSIS)
1000.0000 mL | Freq: Once | INTRAVENOUS | Status: AC
  Administered 2015-07-03: 1000 mL via INTRAVENOUS

## 2015-07-03 MED ORDER — CILOSTAZOL 100 MG PO TABS
100.0000 mg | ORAL_TABLET | Freq: Two times a day (BID) | ORAL | Status: DC
  Administered 2015-07-03 – 2015-07-05 (×4): 100 mg via ORAL
  Filled 2015-07-03 (×5): qty 1

## 2015-07-03 MED ORDER — MECLIZINE HCL 25 MG PO TABS: 25.0000 mg | ORAL_TABLET | Freq: Three times a day (TID) | ORAL | Status: DC | PRN

## 2015-07-03 MED ORDER — ONDANSETRON HCL 4 MG/2ML IJ SOLN: 4.0000 mg | Freq: Four times a day (QID) | INTRAMUSCULAR | Status: DC | PRN

## 2015-07-03 MED ORDER — METOCLOPRAMIDE HCL 10 MG PO TABS
10.0000 mg | ORAL_TABLET | Freq: Every day | ORAL | Status: DC
  Administered 2015-07-03 – 2015-07-04 (×2): 10 mg via ORAL
  Filled 2015-07-03 (×2): qty 1

## 2015-07-03 MED ORDER — TAMSULOSIN HCL 0.4 MG PO CAPS
0.4000 mg | ORAL_CAPSULE | Freq: Every day | ORAL | Status: DC
  Administered 2015-07-04 – 2015-07-05 (×2): 0.4 mg via ORAL
  Filled 2015-07-03 (×2): qty 1

## 2015-07-03 MED ORDER — HEPARIN SODIUM (PORCINE) 5000 UNIT/ML IJ SOLN
5000.0000 [IU] | Freq: Three times a day (TID) | INTRAMUSCULAR | Status: DC
  Administered 2015-07-03 – 2015-07-05 (×5): 5000 [IU] via SUBCUTANEOUS
  Filled 2015-07-03 (×4): qty 1

## 2015-07-03 MED ORDER — ONDANSETRON HCL 4 MG PO TABS: 4.0000 mg | ORAL_TABLET | Freq: Four times a day (QID) | ORAL | Status: DC | PRN

## 2015-07-03 MED ORDER — SIMVASTATIN 40 MG PO TABS
40.0000 mg | ORAL_TABLET | Freq: Every day | ORAL | Status: DC
  Administered 2015-07-03 – 2015-07-04 (×2): 40 mg via ORAL
  Filled 2015-07-03 (×2): qty 1

## 2015-07-03 NOTE — ED Notes (Signed)
Per EMS- pt had confusion per wife that was noticed this morning. Pt had unsteady gait as well. Pt was found to be hypotensive with dizziness. Positive orthostatics with EMS. [id number] systolic initially. 58 systolic sitting. Last BP was 63/39 HR 155.

## 2015-07-03 NOTE — H&P (Signed)
History and Physical    Ricky [name] [id number] DOB: 1935-10-20 DOA: 07/03/2015  Referring MD/NP/PA: PA Vernie Shanks   PCP: [name], MD   Patient coming from: home    Chief Complaint: AMS, weakness, diarrhea   HPI: Ricky [name] is a 80 y.o. male with medical history significant for HTN, HLD, presented from home for evaluation of AMS and weakness. Pt is rather poor historian, somewhat somnolent at the time of the admission, no family at bedside at this time. Pt reports several days duration of watery diarrhea and says everything he eats goes right out. Pt denies fevers, chills, no specific abd or urinary concerns. Review of records indicate, pt came to ED 06/29/2015 for evaluation of weakness and hypotension and was evaluated for stroke with a negative workup.   ED Course: In ED, pt noted to be somewhat somnolent but easy to awake, answers most of the questions but very slow to respond, dosing off intermittently. VS notable for T 99.1 F, HR up to 129, initial BP 65/24, requiring 3 L NS and with improvement in BP up to 132/71. TRH asked to admit for further evaluation.   Review of Systems:  Constitutional: Negative for appetite change and fatigue.  HENT: Negative for ear pain, nosebleeds, congestion Eyes: Negative for pain, discharge, redness, itching and visual disturbance.  Respiratory: Negative for cough, choking, chest tightness, shortness of breath, wheezing and stridor.   Cardiovascular: Negative for chest pain, palpitations and leg swelling.  Gastrointestinal: Negative for abdominal distention.  Genitourinary: Negative for dysuria, urgency, frequency Musculoskeletal: Negative for back pain, joint swelling, arthralgias and gait problem.  Neurological: Negative for dizziness, tremors, seizures Hematological: Negative for adenopathy. Does not bruise/bleed easily.  Psychiatric/Behavioral: Negative for hallucinations  Past Medical History  Diagnosis Date  . HTN (hypertension)   .  HLD (hyperlipidemia)   . Prostate cancer ([place])   . PVD (peripheral vascular disease) [facility])     Past Surgical History  Procedure Laterality Date  . Cholecystectomy    . Back surgery  1994    cervical     reports that he quit smoking about 23 years ago. He does not have any smokeless tobacco history on file. He reports that he does not drink alcohol or use illicit drugs.  Allergies  Allergen Reactions  . Aspirin Other (See Comments)    60m causes nose bleeds    Family History  Problem Relation Age of Onset  . Coronary artery disease    . Heart disease Mother   . Heart attack Mother   . Heart disease Father   . Heart attack Father   . Heart disease Brother   . Heart attack Brother     Prior to Admission medications   Medication Sig Start Date End Date Taking? Authorizing Provider  cilostazol (PLETAL) 100 MG tablet Take 100 mg by mouth 2 (two) times daily.    Historical Provider, MD  gabapentin (NEURONTIN) 300 MG capsule Take 300 mg by mouth 2 (two) times daily. 06/22/15   Historical Provider, MD  lisinopril (PRINIVIL,ZESTRIL) 5 MG tablet Take 10 mg by mouth daily.  06/20/15   Historical Provider, MD  meclizine (ANTIVERT) 25 MG tablet Take 1 tablet (25 mg total) by mouth 3 (three) times daily as needed for dizziness. 06/30/15   KAntonietta Breach PA-C  metoCLOPramide (REGLAN) 10 MG tablet Take 10 mg by mouth at bedtime. 07/04/14   Historical Provider, MD  simvastatin (ZOCOR) 40 MG tablet Take 40 mg by mouth at bedtime.  Historical Provider, MD  tamsulosin (FLOMAX) 0.4 MG CAPS capsule Take 0.4 mg by mouth daily. 06/22/15   Historical Provider, MD    Physical Exam: Filed Vitals:   07/03/15 1430 07/03/15 1435 07/03/15 1500 07/03/15 1627  BP: 119/69  132/75   Pulse: 104  108   Temp:  98.4 F (36.9 C)  98 F (36.7 C)  TempSrc:  Rectal    Resp: 15  18   SpO2: 98%  96%     Constitutional: NAD, calm, comfortable Filed Vitals:   07/03/15 1430 07/03/15 1435 07/03/15 1500 07/03/15  1627  BP: 119/69  132/75   Pulse: 104  108   Temp:  98.4 F (36.9 C)  98 F (36.7 C)  TempSrc:  Rectal    Resp: 15  18   SpO2: 98%  96%    Eyes: PERRL, lids and conjunctivae normal ENMT: Mucous membranes are dry. Posterior pharynx clear of any exudate or lesions.Normal dentition.  Neck: normal, supple, no masses, no thyromegaly Respiratory: diminished breath sounds at bases but no wheezing and no crackles  Cardiovascular: Regular rate and rhythm, no murmurs / rubs / gallops.  Abdomen: no tenderness, no masses palpated. No hepatosplenomegaly. Bowel sounds positive.  Musculoskeletal: No joint deformity upper and lower extremities.  Skin: no rashes, lesions, ulcers. No induration Neurologic: somnolent but easy to awake, moving all 4 extremities spontaneously. Sensation intact, DTR normal.  Psychiatric: difficult to assess due to somnolence    Labs on Admission: I have personally reviewed following labs and imaging studies  CBC:  Recent Labs Lab 06/29/15 1946 07/03/15 1347 07/03/15 1417  WBC 8.1 8.5  --   NEUTROABS 5.0 6.3  --   HGB 12.9* 12.3* 12.6*  HCT 36.4* 35.4* 37.0*  MCV 80.4 80.6  --   PLT 161 124*  --    Basic Metabolic Panel:  Recent Labs Lab 06/29/15 1946 07/03/15 1347 07/03/15 1417  NA 139 138 142  K 3.9 4.5 4.5  CL 107 109 107  CO2 24 20*  --   GLUCOSE 146* 198* 196*  BUN 28* 30* 31*  CREATININE 1.55* 3.42* 3.40*  CALCIUM 9.3 8.2*  --    Liver Function Tests:  Recent Labs Lab 07/03/15 1347  AST 30  ALT 30  ALKPHOS 58  BILITOT 0.4  PROT 5.7*  ALBUMIN 3.1*   Coagulation Profile:  Recent Labs Lab 07/03/15 1347  INR 1.16   Urine analysis:    Component Value Date/Time   COLORURINE AMBER* 07/03/2015 [place] 07/03/2015 1555   LABSPEC 1.017 07/03/2015 1555   PHURINE 5.5 07/03/2015 [place] 07/03/2015 1555   HGBUR SMALL* 07/03/2015 1555   [place] 07/03/2015 1555   KETONESUR 15* 07/03/2015  1555   PROTEINUR 30* 07/03/2015 1555   UROBILINOGEN 1.0 04/14/2013 1138   NITRITE NEGATIVE 07/03/2015 1555   [place] 07/03/2015 1555   Radiological Exams on Admission: Ct Head Wo Contrast  07/03/2015  CLINICAL DATA:  Recent fall, dizziness EXAM: CT HEAD WITHOUT CONTRAST TECHNIQUE: Contiguous axial images were obtained from the base of the skull through the vertex without intravenous contrast. COMPARISON:  06/29/15 FINDINGS: Bony calvarium is intact. Diffuse atrophic changes are noted. Chronic white matter ischemic changes noted. 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 No acute hemorrhage, acute infarction or space-occupying mass lesion are seen. IMPRESSION: Chronic atrophic and ischemic changes. No acute abnormality noted. Electronically Signed   By: Linus Mako.D.  On: 07/03/2015 16:15   Dg Chest Portable 1 View  07/03/2015  CLINICAL DATA:  HYPOTENSIVE EXAM: PORTABLE CHEST 1 VIEW COMPARISON:  10/09/2011 FINDINGS: The heart size and mediastinal contours are within normal limits. Both lungs are clear. The visualized skeletal structures are unremarkable. IMPRESSION: No active disease. Electronically Signed   By: Skipper Cliche M.D.   On: 07/03/2015 13:44    EKG: pending   Assessment/Plan  Principal Problem:   Sepsis ([place]) - pt met criteria for sepsis on admission with hypotension, elevated lactic acid, HR up to 129  - given diarrhea, source possibly GI, will order C. Diff by PCR, stool GI panel - continue to provide IVF, sepsis order set in place - follow up on lactic acid, procalcitonin level  - place on Flagyl IV until C. Diff back   Active Problems:   AKI (acute kidney injury) ([place]) and Dehydration - provide IVF and repeat BMP in AM    Acute encephalopathy - secondary to the above - monitor response to ABX and supportive care    DVT prophylaxis: Heparin SQ Code Status: Full  Family Communication: Pt updated at  bedside Disposition Plan: Home when renal failure improves and diarrhea etiology resolved  Consults called: None Admission status: Inpatient   Faye Ramsay MD [facility] Pager [phone]  If 7PM-7AM, please contact night-coverage [email or web address] Password [facility]  07/03/2015, 4:42 PM

## 2015-07-03 NOTE — ED Provider Notes (Signed)
Patient suffered near syncopal event earlier today. He reports feeling lightheaded worse with standing. Noted to be hypotensive and tachycardic by EMS. He reportedly has had multiple episodes of diarrhea. Patient is alert Glasgow Coma Score 15 lungs clear auscultation heart tachycardic regular rhythm abdomen nondistended nontender neurologic Glasgow Coma Score 15 motor strength 5 over 5 overall moves all extremities well  Orlie Dakin, MD 07/03/15 1531

## 2015-07-03 NOTE — ED Notes (Signed)
Attempted report 

## 2015-07-03 NOTE — ED Provider Notes (Signed)
CSN: [id number]     Arrival date & time 07/03/15  1257 History   First MD Initiated Contact with Patient 07/03/15 1316     Chief Complaint  Patient presents with  . Hypotension     (Consider location/radiation/quality/duration/timing/severity/associated sxs/prior Treatment) HPI   Ricky [name] Is a 80 year old male who presents the emergency department with hypotension. The patient was seen for the same complaint on 06/29/2015 and evaluated for stroke with a negative workup. He is a level V caveat due to altered mental status. According to EMS, they were called to the home because the patient was stumbling. Upon arrival, they found him to be tachycardic with a rate of [id number] and systolic pressures at 60. I spoke with the patient's wife , he states that this morning the patient was outside and states that he fell. When he came back in. She states that he was stumbling around the house and acting "erratic" she states that he was incontinent of stool and has had several days of diarrhea. He has not had any new medication changes, new neurologic deficits. He denies chest pain, shortness of breath, dizziness while lying down, black or tarry stools. Patient states that he is [employer]'s [employer] and does not accept blood products if he does happen have a bleed. He is not on any blood thinners. The patient recently had CAP and has taken abx recently.  Past Medical History  Diagnosis Date  . HTN (hypertension)   . HLD (hyperlipidemia)   . Prostate cancer ([place])   . PVD (peripheral vascular disease) [facility])    Past Surgical History  Procedure Laterality Date  . Cholecystectomy    . Back surgery  1994    cervical   Family History  Problem Relation Age of Onset  . Coronary artery disease    . Heart disease Mother   . Heart attack Mother   . Heart disease Father   . Heart attack Father   . Heart disease Brother   . Heart attack Brother    Social History  Substance Use Topics  . Smoking status: Former  Smoker    Quit date: 02/03/1992  . Smokeless tobacco: None  . Alcohol Use: No    Review of Systems  Unable to perform ROS: Mental status change        Allergies  Aspirin  Home Medications   Prior to Admission medications   Medication Sig Start Date End Date Taking? Authorizing Provider  cilostazol (PLETAL) 100 MG tablet Take 100 mg by mouth 2 (two) times daily.    Historical Provider, MD  gabapentin (NEURONTIN) 300 MG capsule Take 300 mg by mouth 2 (two) times daily. 06/22/15   Historical Provider, MD  lisinopril (PRINIVIL,ZESTRIL) 5 MG tablet Take 10 mg by mouth daily.  06/20/15   Historical Provider, MD  meclizine (ANTIVERT) 25 MG tablet Take 1 tablet (25 mg total) by mouth 3 (three) times daily as needed for dizziness. 06/30/15   Antonietta Breach, PA-C  metoCLOPramide (REGLAN) 10 MG tablet Take 10 mg by mouth at bedtime. 07/04/14   Historical Provider, MD  simvastatin (ZOCOR) 40 MG tablet Take 40 mg by mouth at bedtime.     Historical Provider, MD  tamsulosin (FLOMAX) 0.4 MG CAPS capsule Take 0.4 mg by mouth daily. 06/22/15   Historical Provider, MD   BP 103/63 mmHg  Pulse 126  Temp(Src) 99.1 F (37.3 C) (Rectal)  Resp 17  SpO2 100% Physical Exam  Constitutional: He appears well-developed and well-nourished. No distress.  HENT:  Head: Normocephalic and atraumatic.  Eyes: Conjunctivae are normal. No scleral icterus.  Neck: Normal range of motion. Neck supple.  Cardiovascular: Regular rhythm and normal heart sounds.   Tachycardic  Pulmonary/Chest: Effort normal and breath sounds normal. No respiratory distress.  Abdominal: Soft. He exhibits no distension and no mass. There is no tenderness. There is no guarding.  Musculoskeletal: He exhibits no edema.  Speech is clear and goal oriented, follows commands Major Cranial nerves without deficit, no facial droop Normal strength in upper and lower extremities bilaterally including dorsiflexion and plantar flexion, strong and equal grip  strength Sensation normal to light and sharp touch Moves extremities without ataxia, coordination intact Normal finger to nose and rapid alternating movements Gait defferred      Neurological: He is alert.  Oriented to person and place. Patient answers "1969. When asked about year. His wife states that he would normally know that.  Skin: Skin is warm and dry. No rash noted. He is not diaphoretic.  Psychiatric: His behavior is normal.  Nursing note and vitals reviewed.   ED Course  .Critical Care Performed by: Margarita Mail Authorized by: Margarita Mail Total critical care time: 40 minutes Critical care time was exclusive of separately billable procedures and treating other patients. Critical care was necessary to treat or prevent imminent or life-threatening deterioration of the following conditions: dehydration. Critical care was time spent personally by me on the following activities: interpretation of cardiac output measurements, evaluation of patient's response to treatment, examination of patient, obtaining history from patient or surrogate, ordering and performing treatments and interventions, ordering and review of laboratory studies, ordering and review of radiographic studies, pulse oximetry, re-evaluation of patient's condition and review of old charts.   (including critical care time) Labs Review Labs Reviewed  APTT - Abnormal; Notable for the following:    aPTT 39 (*)    All other components within normal limits  CBC WITH DIFFERENTIAL/PLATELET - Abnormal; Notable for the following:    Hemoglobin 12.3 (*)    HCT 35.4 (*)    Platelets 124 (*)    All other components within normal limits  I-STAT CHEM 8, ED - Abnormal; Notable for the following:    BUN 31 (*)    Creatinine, Ser 3.40 (*)    Glucose, Bld 196 (*)    Calcium, Ion 1.05 (*)    Hemoglobin 12.6 (*)    HCT 37.0 (*)    All other components within normal limits  I-STAT CG4 LACTIC ACID, ED - Abnormal;  Notable for the following:    Lactic Acid, Venous 3.35 (*)    All other components within normal limits  CULTURE, BLOOD (ROUTINE X 2)  CULTURE, BLOOD (ROUTINE X 2)  ETHANOL  PROTIME-INR  CBC  DIFFERENTIAL  COMPREHENSIVE METABOLIC PANEL  URINE RAPID DRUG SCREEN, HOSP PERFORMED  URINALYSIS, ROUTINE W REFLEX MICROSCOPIC (NOT AT [facility]  I-STAT TROPOININ, ED  I-STAT TROPOININ, ED  CBG MONITORING, ED  POC OCCULT BLOOD, ED    Imaging Review Dg Chest Portable 1 View  07/03/2015  CLINICAL DATA:  HYPOTENSIVE EXAM: PORTABLE CHEST 1 VIEW COMPARISON:  10/09/2011 FINDINGS: The heart size and mediastinal contours are within normal limits. Both lungs are clear. The visualized skeletal structures are unremarkable. IMPRESSION: No active disease. Electronically Signed   By: Skipper Cliche M.D.   On: 07/03/2015 13:44   I have personally reviewed and evaluated these images and lab results as part of my medical decision-making.   EKG Interpretation  Date/Time:  Wednesday Jul 03 2015 14:07:07 EDT Ventricular Rate:  108 PR Interval:  214 QRS Duration: 71 QT Interval:  341 QTC Calculation: 457 R Axis:   -34 Text Interpretation:  Sinus tachycardia Borderline prolonged PR interval  Left axis deviation Nonspecific T abnormalities, inferior leads SINCE LAST  TRACING HEART RATE HAS INCREASED Confirmed by Winfred Leeds  MD, SAM [phone])  on 07/03/2015 2:10:10 PM      MDM   Final diagnoses:  AKI (acute kidney injury) ([place])  Dehydration  Other specified hypotension    Patient with significant hypotension and tachycardia. His EKG shows sinus tachycardia. He denies chest pain or shortness of breath. The patient is afebrile. Given 3 L of fluid with significant improvement in his blood pressure. He does not appear to have a leukocytosis and does not fit criteria for sepsis. 4. He has significant dehydration and acute kidney injury secondary to severe dehydration, which I believe is from his  diarrhea. Patient had a negative MRI of his brain on 06/29/2015.   3:49 PM BP 132/75 mmHg  Pulse 108  Temp(Src) 98.4 F (36.9 C) (Rectal)  Resp 18  SpO2 96% pATIENT will be admitted for AKI by Dr. Doyle Askew. I have ordered stool cultures. Appears stable for admission.   Margarita Mail, PA-C 07/03/15 [place], MD 07/03/15 [id number]

## 2015-07-03 NOTE — Progress Notes (Signed)
Patient arrived via stretcher to 2C14. Patient transferred to bed, CHG bath performed and MRSA swab collected. Resting comfortably in bed in no distress. No complaint of pain.   Milford Cage, RN

## 2015-07-03 NOTE — Progress Notes (Signed)
CRITICAL VALUE ALERT  Critical value received:  Lactic acid 3.3  Date of notification:  07/03/15  Time of notification:  2300  Critical value read back: Yes  Nurse who received alert:  Remo Lipps, RN  MD notified (1st page):  [facility]  Time of first page:  2330

## 2015-07-03 NOTE — ED Notes (Signed)
Patient transported to CT 

## 2015-07-04 DIAGNOSIS — R6510 Systemic inflammatory response syndrome (SIRS) of non-infectious origin without acute organ dysfunction: Secondary | ICD-10-CM

## 2015-07-04 DIAGNOSIS — G9340 Encephalopathy, unspecified: Secondary | ICD-10-CM | POA: Diagnosis not present

## 2015-07-04 DIAGNOSIS — R197 Diarrhea, unspecified: Secondary | ICD-10-CM

## 2015-07-04 DIAGNOSIS — N179 Acute kidney failure, unspecified: Secondary | ICD-10-CM

## 2015-07-04 DIAGNOSIS — E860 Dehydration: Secondary | ICD-10-CM

## 2015-07-04 DIAGNOSIS — I9589 Other hypotension: Secondary | ICD-10-CM

## 2015-07-04 DIAGNOSIS — E785 Hyperlipidemia, unspecified: Secondary | ICD-10-CM | POA: Insufficient documentation

## 2015-07-04 DIAGNOSIS — I959 Hypotension, unspecified: Secondary | ICD-10-CM | POA: Insufficient documentation

## 2015-07-04 LAB — CBC
HEMATOCRIT: 33.8 % — AB (ref 39.0–52.0)
HEMOGLOBIN: 11.7 g/dL — AB (ref 13.0–17.0)
MCH: 28.1 pg (ref 26.0–34.0)
MCHC: 34.6 g/dL (ref 30.0–36.0)
MCV: 81.3 fL (ref 78.0–100.0)
Platelets: 126 10*3/uL — ABNORMAL LOW (ref 150–400)
RBC: 4.16 MIL/uL — AB (ref 4.22–5.81)
RDW: 15.8 % — ABNORMAL HIGH (ref 11.5–15.5)
WBC: 6.1 10*3/uL (ref 4.0–10.5)

## 2015-07-04 LAB — GASTROINTESTINAL PANEL BY PCR, STOOL (REPLACES STOOL CULTURE)
Adenovirus F40/41: NOT DETECTED
Astrovirus: NOT DETECTED
CAMPYLOBACTER SPECIES: NOT DETECTED
CRYPTOSPORIDIUM: NOT DETECTED
Cyclospora cayetanensis: NOT DETECTED
E. coli O157: NOT DETECTED
ENTEROPATHOGENIC E COLI (EPEC): NOT DETECTED
Entamoeba histolytica: NOT DETECTED
Enteroaggregative E coli (EAEC): NOT DETECTED
Enterotoxigenic E coli (ETEC): NOT DETECTED
Giardia lamblia: NOT DETECTED
NOROVIRUS GI/GII: NOT DETECTED
PLESIMONAS SHIGELLOIDES: NOT DETECTED
ROTAVIRUS A: NOT DETECTED
SALMONELLA SPECIES: NOT DETECTED
SHIGA LIKE TOXIN PRODUCING E COLI (STEC): NOT DETECTED
SHIGELLA/ENTEROINVASIVE E COLI (EIEC): NOT DETECTED
Sapovirus (I, II, IV, and V): NOT DETECTED
Vibrio cholerae: NOT DETECTED
Vibrio species: NOT DETECTED
YERSINIA ENTEROCOLITICA: NOT DETECTED

## 2015-07-04 LAB — URINE MICROSCOPIC-ADD ON

## 2015-07-04 LAB — C DIFFICILE QUICK SCREEN W PCR REFLEX
C Diff antigen: NEGATIVE
C Diff interpretation: NEGATIVE
C Diff toxin: NEGATIVE

## 2015-07-04 LAB — BASIC METABOLIC PANEL
ANION GAP: 6 (ref 5–15)
BUN: 22 mg/dL — ABNORMAL HIGH (ref 6–20)
CO2: 22 mmol/L (ref 22–32)
Calcium: 8.4 mg/dL — ABNORMAL LOW (ref 8.9–10.3)
Chloride: 111 mmol/L (ref 101–111)
Creatinine, Ser: 1.86 mg/dL — ABNORMAL HIGH (ref 0.61–1.24)
GFR, EST AFRICAN AMERICAN: 38 mL/min — AB (ref >60)
GFR, EST NON AFRICAN AMERICAN: 33 mL/min — AB (ref >60)
Glucose, Bld: 155 mg/dL — ABNORMAL HIGH (ref 65–99)
POTASSIUM: 3.9 mmol/L (ref 3.5–5.1)
SODIUM: 139 mmol/L (ref 135–145)

## 2015-07-04 LAB — MRSA PCR SCREENING: MRSA BY PCR: NEGATIVE

## 2015-07-04 LAB — URINALYSIS, ROUTINE W REFLEX MICROSCOPIC
Bilirubin Urine: NEGATIVE
GLUCOSE, UA: 500 mg/dL — AB
Ketones, ur: NEGATIVE mg/dL
LEUKOCYTES UA: NEGATIVE
NITRITE: NEGATIVE
PROTEIN: NEGATIVE mg/dL
Specific Gravity, Urine: 1.02 (ref 1.005–1.030)
pH: 5.5 (ref 5.0–8.0)

## 2015-07-04 LAB — LACTIC ACID, PLASMA: LACTIC ACID, VENOUS: 1.7 mmol/L (ref 0.5–2.0)

## 2015-07-04 NOTE — Care Management Obs Status (Signed)
[facility] NOTIFICATION   Patient Details  Name: Ricky [name] MRN: [id number] Date of Birth: May 03, 1935   Medicare Observation Status Notification Given:  Yes Code [address] [place], [place] 07/04/2015, 4:09 PM

## 2015-07-04 NOTE — Progress Notes (Signed)
[facility] PROGRESS NOTE  Lochlain Bigner [id number] DOB: 07-03-1935 DOA: 07/03/2015 PCP: [name], MD  Interim summary and HPI 80 y.o. male with medical history significant for HTN, HLD, presented from home for evaluation of AMS and weakness. Pt is rather poor historian, somewhat somnolent at the time of the admission, no family at bedside at this time. Pt reports several days duration of watery diarrhea and says everything he eats goes right out. Pt denies fevers, chills, no specific abd or urinary concerns. Review of records indicate, pt came to ED 06/29/2015 for evaluation of weakness and hypotension and was evaluated for stroke with a negative workup.   Assessment/Plan: 1-SIRS: no sepsis present on admission; as no source able to be identified. -for now will empirically use flagyl -continue IVF's -no fever, no WBC's, normal HR now -will follow stool study -continue supportive care  2-lactic acid: secondary to dehydration -continue IVF's -will follow lactic acid level  3-acute renal failure: due to dehydration/hypotension and continue use of lisinopril  -improving/resolving -will monitor -Cr 1.8 currently -no UTI on his UA  4-BPH: will continue flomax  5-HLD: -continue zocor  6-PVD: stable -continue Pletal    Code Status: Full Family Communication: no family at bedside  Disposition Plan: will follow stool culture. Will transfer to telemetry bed. Follow electrolytes and re-hydration. Home in 1-2 days.   Consultants:  None   Procedures:  See below for x-ray reports   Antibiotics:  Flagyl 5/31>>  HPI/Subjective: Afebrile, oriented X 3 and overall feeling better. Still with diarrhea. Renal function improved, normal WBC's, normal HR.  Objective: Filed Vitals:   07/04/15 0600 07/04/15 0700  BP: 140/74 138/80  Pulse: 64 66  Temp:  97.8 F (36.6 C)  Resp: 20 14    Intake/Output Summary (Last 24 hours) at 07/04/15 1209 Last data filed at  07/04/15 1131  Gross per 24 hour  Intake   2385 ml  Output   1225 ml  Net   1160 ml   There were no vitals filed for this visit.  Exam:   General:  Afebrile, feeling better and currently oriented X3. Reports still some diarrhea. No abd pain, nausea or vomiting.   Cardiovascular: S1 and S2, no rubs o rgallops  Respiratory: CTA bilaterally  Abdomen: soft, NT/ND, positive BS  Musculoskeletal: no edema or cyanosis   Data Reviewed: Basic Metabolic Panel:  Recent Labs Lab 06/29/15 1946 07/03/15 1347 07/03/15 1417 07/04/15 0340  NA 139 138 142 139  K 3.9 4.5 4.5 3.9  CL 107 109 107 111  CO2 24 20*  --  22  GLUCOSE 146* 198* 196* 155*  BUN 28* 30* 31* 22*  CREATININE 1.55* 3.42* 3.40* 1.86*  CALCIUM 9.3 8.2*  --  8.4*   Liver Function Tests:  Recent Labs Lab 07/03/15 1347  AST 30  ALT 30  ALKPHOS 58  BILITOT 0.4  PROT 5.7*  ALBUMIN 3.1*   CBC:  Recent Labs Lab 06/29/15 1946 07/03/15 1347 07/03/15 1417 07/04/15 0340  WBC 8.1 8.5  --  6.1  NEUTROABS 5.0 6.3  --   --   HGB 12.9* 12.3* 12.6* 11.7*  HCT 36.4* 35.4* 37.0* 33.8*  MCV 80.4 80.6  --  81.3  PLT 161 124*  --  126*   CBG: No results for input(s): GLUCAP in the last 168 hours.  Recent Results (from the past 240 hour(s))  MRSA PCR Screening     Status: None   Collection Time: 07/03/15 10:10 PM  Result Value Ref Range Status   MRSA by PCR NEGATIVE NEGATIVE Final    Comment:        The GeneXpert MRSA Assay (FDA approved for NASAL specimens only), is one component of a comprehensive MRSA colonization surveillance program. It is not intended to diagnose MRSA infection nor to guide or monitor treatment for MRSA infections.      Studies: Ct Head Wo Contrast  07/03/2015  CLINICAL DATA:  Recent fall, dizziness EXAM: CT HEAD WITHOUT CONTRAST TECHNIQUE: Contiguous axial images were obtained from the base of the skull through the vertex without intravenous contrast. COMPARISON:  06/29/15  FINDINGS: Bony calvarium is intact. Diffuse atrophic changes are noted. Chronic white matter ischemic changes noted. 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 No acute hemorrhage, acute infarction or space-occupying mass lesion are seen. IMPRESSION: Chronic atrophic and ischemic changes. No acute abnormality noted. Electronically Signed   By: [name] M.D.   On: 07/03/2015 16:15   Dg Chest Portable 1 View  07/03/2015  CLINICAL DATA:  HYPOTENSIVE EXAM: PORTABLE CHEST 1 VIEW COMPARISON:  10/09/2011 FINDINGS: The heart size and mediastinal contours are within normal limits. Both lungs are clear. The visualized skeletal structures are unremarkable. IMPRESSION: No active disease. Electronically Signed   By: Skipper Cliche M.D.   On: 07/03/2015 13:44    Scheduled Meds: . cilostazol  100 mg Oral BID  . heparin  5,000 Units Subcutaneous Q8H  . metoCLOPramide  10 mg Oral QHS  . metronidazole  500 mg Intravenous Q8H  . simvastatin  40 mg Oral QHS  . tamsulosin  0.4 mg Oral Daily   Continuous Infusions: . sodium chloride 75 mL/hr at 07/04/15 0130    Principal Problem:   Sepsis ([place]) Active Problems:   AKI (acute kidney injury) ([place])   Dehydration   Diarrhea   Acute encephalopathy    Time spent: 8 minutes    Barton Dubois  [facility] Pager [phone]. If 7PM-7AM, please contact night-coverage at [email or web address], password [facility] 07/04/2015, 12:09 PM  LOS: 1 day

## 2015-07-04 NOTE — Progress Notes (Signed)
Pt transported upstairs to 5W with tech.  Belongings including clothing and telephone transferred with the pt.

## 2015-07-05 DIAGNOSIS — R197 Diarrhea, unspecified: Secondary | ICD-10-CM | POA: Diagnosis not present

## 2015-07-05 DIAGNOSIS — E872 Acidosis, unspecified: Secondary | ICD-10-CM | POA: Insufficient documentation

## 2015-07-05 DIAGNOSIS — N179 Acute kidney failure, unspecified: Secondary | ICD-10-CM | POA: Diagnosis not present

## 2015-07-05 DIAGNOSIS — E860 Dehydration: Secondary | ICD-10-CM | POA: Diagnosis not present

## 2015-07-05 DIAGNOSIS — G9340 Encephalopathy, unspecified: Secondary | ICD-10-CM | POA: Diagnosis not present

## 2015-07-05 LAB — BASIC METABOLIC PANEL
ANION GAP: 6 (ref 5–15)
BUN: 10 mg/dL (ref 6–20)
CHLORIDE: 112 mmol/L — AB (ref 101–111)
CO2: 20 mmol/L — ABNORMAL LOW (ref 22–32)
Calcium: 8.7 mg/dL — ABNORMAL LOW (ref 8.9–10.3)
Creatinine, Ser: 1.07 mg/dL (ref 0.61–1.24)
GFR calc Af Amer: >60 mL/min (ref >60)
GFR calc non Af Amer: >60 mL/min (ref >60)
GLUCOSE: 149 mg/dL — AB (ref 65–99)
POTASSIUM: 4.4 mmol/L (ref 3.5–5.1)
Sodium: 138 mmol/L (ref 135–145)

## 2015-07-05 LAB — URINE CULTURE: Culture: NO GROWTH

## 2015-07-05 MED ORDER — LOPERAMIDE HCL 2 MG PO CAPS: 2.0000 mg | ORAL_CAPSULE | ORAL | Status: DC | PRN

## 2015-07-05 MED ORDER — SACCHAROMYCES BOULARDII 250 MG PO CAPS: 250.0000 mg | ORAL_CAPSULE | Freq: Two times a day (BID) | ORAL | Status: DC

## 2015-07-05 NOTE — Discharge Summary (Signed)
Physician Discharge Summary  Ricky [name] [id number] DOB: [birth year] DOA: 07/03/2015  PCP: [name], MD  Admit date: 07/03/2015 Discharge date: 07/05/2015  Time spent: 35 minutes  Recommendations for Outpatient Follow-up:  Repeat BMET to follow electrolytes and renal function  Reassess BP and adjust medications as needed   Discharge Diagnoses:    SIRS (systemic inflammatory response syndrome) ([place])   AKI (acute kidney injury) ([place])   Dehydration   Diarrhea   Acute encephalopathy   Hypertension/ with hypotension on admission    HLD (hyperlipidemia)   Discharge Condition: stable and improved. Discharge home with instructions to arrange follow up with PCP in 10 days.  Diet recommendation: heart healthy diet   History of present illness:  80 y.o. male with medical history significant for HTN, HLD, presented from home for evaluation of AMS and weakness. Pt is rather poor historian, somewhat somnolent at the time of the admission, no family at bedside at this time. Pt reports several days duration of watery diarrhea and says everything he eats goes right out. Pt denies fevers, chills, no specific abd or urinary concerns. Review of records indicate, pt came to ED 06/29/2015 for evaluation of weakness and hypotension and was evaluated for stroke with a negative workup.   Hospital Course:  1-SIRS: no sepsis present on admission; as no source able to be identified. -received empirically treatment with flagyl for presumed bacterial gastroenteritis; but stool study r/o that as potential source and antibiotics discontinued. -after 48 hours of supportive care, his electrolytes, renal function and SIRS parameters normalized and the patient was discharge home in stable condition. (no fever, normal WBC's, normal HR) -advise to keep himself well hydrated   2-lactic acidosis: secondary to dehydration -resolved with IVF's -lactic acid WNL at discharge -patient advise to keep himself well  hydrated  3-acute renal failure: due to dehydration/hypotension and continue use of lisinopril  -improved/resolved with fluid resuscitation -Cr 1.07 at discharge -no UTI on his UA  4-BPH: will continue flomax -no complaints of urinary retention   5-HLD: -continue zocor  6-PVD: stable -continue Pletal   7-essential HTN: with initial hypotensive episode on admission -due to dehydration -at discharge BP stable and rising -patient instructed to resume his antihypertensive regimen and to follow heart healthy diet   8-acute encephalopathy: associated with dehydration and mild uremia from AKI -resolved by time of discharge -No other abnormalities seen on work up   Procedures:  See below for x-ray reports   Consultations:  None   Discharge Exam: Filed Vitals:   07/04/15 2204 07/05/15 0515  BP: 161/78 166/79  Pulse: 65 65  Temp: 98.2 F (36.8 C) 98.2 F (36.8 C)  Resp: 18 18    General: Afebrile, feeling much better and currently oriented X3, asking to go home. Reports still some soft stools (only 2 in the last 24 hours). No abd pain, nausea or vomiting. Tolerating PO's w/o problems.  Cardiovascular: S1 and S2, no rubs o rgallops  Respiratory: CTA bilaterally  Abdomen: soft, NT/ND, positive BS  Musculoskeletal: no edema or cyanosis  Discharge Instructions   Discharge Instructions    Diet - low sodium heart healthy    Complete by:  As directed      Discharge instructions    Complete by:  As directed   Take medications as prescribed Keep yourself well hydrated Arrange follow up with PCP in 10 dyas          Current Discharge Medication List    START taking these medications  Details  loperamide (IMODIUM) 2 MG capsule Take 1 capsule (2 mg total) by mouth every 4 (four) hours as needed for diarrhea or loose stools (do not take more than 8mg  in 24 hours). Qty: 30 capsule, Refills: 0    saccharomyces boulardii (FLORASTOR) 250 MG capsule Take 1 capsule  (250 mg total) by mouth 2 (two) times daily. Qty: 60 capsule, Refills: 0      CONTINUE these medications which have NOT CHANGED   Details  cilostazol (PLETAL) 100 MG tablet Take 100 mg by mouth 2 (two) times daily.    gabapentin (NEURONTIN) 300 MG capsule Take 300 mg by mouth 2 (two) times daily.    lisinopril (PRINIVIL,ZESTRIL) 5 MG tablet Take 10 mg by mouth daily.     metoCLOPramide (REGLAN) 10 MG tablet Take 10 mg by mouth at bedtime. Refills: 3    simvastatin (ZOCOR) 40 MG tablet Take 40 mg by mouth at bedtime.     tamsulosin (FLOMAX) 0.4 MG CAPS capsule Take 0.4 mg by mouth daily.    meclizine (ANTIVERT) 25 MG tablet Take 1 tablet (25 mg total) by mouth 3 (three) times daily as needed for dizziness. Qty: 15 tablet, Refills: 0       Allergies  Allergen Reactions  . Aspirin Other (See Comments)    81mg  causes nose bleeds   Follow-up Information    Follow up with [name], MD. Schedule an appointment as soon as possible for a visit in 10 days.   Specialty:  Internal Medicine   Contact information:   [address] Kris Hartmann [place] [address] [phone]       The results of significant diagnostics from this hospitalization (including imaging, microbiology, ancillary and laboratory) are listed below for reference.    Significant Diagnostic Studies: Ct Head Wo Contrast  07/03/2015  CLINICAL DATA:  Recent fall, dizziness EXAM: CT HEAD WITHOUT CONTRAST TECHNIQUE: Contiguous axial images were obtained from the base of the skull through the vertex without intravenous contrast. COMPARISON:  06/29/15 FINDINGS: Bony calvarium is intact. Diffuse atrophic changes are noted. Chronic white matter ischemic changes noted. 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 No acute hemorrhage, acute infarction or space-occupying mass lesion are seen. IMPRESSION: Chronic atrophic and ischemic changes. No acute abnormality noted.  Electronically Signed   By: [name] M.D.   On: 07/03/2015 16:15   Ct Head Wo Contrast  06/29/2015  CLINICAL DATA:  Patient with abnormal sensation, altered mental status. EXAM: CT HEAD WITHOUT CONTRAST TECHNIQUE: Contiguous axial images were obtained from the base of the skull through the vertex without intravenous contrast. COMPARISON:  MRI brain 12/28/2011; CT brain 09/01/2006. 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 No evidence for acute cortically based infarct, intracranial hemorrhage, mass lesion or mass-effect. Orbits are unremarkable. Paranasal sinuses are well aerated. Mastoid air cells unremarkable. Calvarium is intact. IMPRESSION: No acute intracranial process. 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 Electronically Signed   By: Lovey Newcomer M.D.   On: 06/29/2015 20:30   Mr Brain Wo Contrast  06/29/2015  CLINICAL DATA:  Initial evaluation for acute dizziness, blurry vision. EXAM: MRI HEAD WITHOUT CONTRAST TECHNIQUE: Multiplanar, multiecho pulse sequences of the brain and surrounding structures were obtained without intravenous contrast. COMPARISON:  Prior CT from earlier same day. FINDINGS: Diffuse prominence of the CSF containing spaces is compatible with generalized cerebral atrophy. Patchy and confluent T2/FLAIR hyperintensity within the periventricular and deep white matter both cerebral hemispheres most consistent  with chronic small vessel ischemic disease. These changes are mildly progressed relative to 2013. Patchy encephalomalacia with gliosis related to remote right MCA territory infarct noted, stable. No abnormal foci of restricted diffusion suggest acute intracranial infarct. Gray-white matter differentiation maintained. Normal intracranial vascular flow voids are preserved. No acute or chronic intracranial hemorrhage. No mass lesion, midline shift, or mass effect. No hydrocephalus. No extra-axial fluid collection. Major dural sinuses are grossly patent.  Craniocervical junction within normal limits. Visualized upper cervical spine unremarkable. Pituitary gland within normal limits. No acute abnormality about the orbits. Mild scattered mucosal thickening within the ethmoidal air cells. Paranasal sinuses are otherwise clear. No mastoid effusion. Inner ear structures grossly normal. Bone marrow signal intensity within normal limits. No scalp soft tissue abnormality. IMPRESSION: 1. No acute intracranial process identified. 2. Remote right MCA territory infarct. 3. Mild age-related cerebral atrophy with chronic small vessel ischemic disease, mildly progressed relative to 2013. Electronically Signed   By: Jeannine Boga M.D.   On: 06/29/2015 23:37   Dg Chest Portable 1 View  07/03/2015  CLINICAL DATA:  HYPOTENSIVE EXAM: PORTABLE CHEST 1 VIEW COMPARISON:  10/09/2011 FINDINGS: The heart size and mediastinal contours are within normal limits. Both lungs are clear. The visualized skeletal structures are unremarkable. IMPRESSION: No active disease. Electronically Signed   By: Skipper Cliche M.D.   On: 07/03/2015 13:44    Microbiology: Recent Results (from the past 240 hour(s))  Culture, blood (Routine X 2) w Reflex to ID Panel     Status: None (Preliminary result)   Collection Time: 07/03/15  1:35 PM  Result Value Ref Range Status   Specimen Description BLOOD RIGHT ANTECUBITAL  Final   Special Requests BOTTLES DRAWN AEROBIC AND ANAEROBIC 4CC  Final   Culture NO GROWTH 1 DAY  Final   Report Status PENDING  Incomplete  Culture, blood (Routine X 2) w Reflex to ID Panel     Status: None (Preliminary result)   Collection Time: 07/03/15  2:04 PM  Result Value Ref Range Status   Specimen Description BLOOD LEFT HAND  Final   Special Requests   Final    BOTTLES DRAWN AEROBIC AND ANAEROBIC 10CC AER 5CC ANA   Culture NO GROWTH 1 DAY  Final   Report Status PENDING  Incomplete  MRSA PCR Screening     Status: None   Collection Time: 07/03/15 10:10 PM  Result  Value Ref Range Status   MRSA by PCR NEGATIVE NEGATIVE Final    Comment:        The GeneXpert MRSA Assay (FDA approved for NASAL specimens only), is one component of a comprehensive MRSA colonization surveillance program. It is not intended to diagnose MRSA infection nor to guide or monitor treatment for MRSA infections.   Urine culture     Status: None   Collection Time: 07/03/15 11:42 PM  Result Value Ref Range Status   Specimen Description URINE, CLEAN CATCH  Final   Special Requests NONE  Final   Culture NO GROWTH  Final   Report Status 07/05/2015 FINAL  Final  C difficile quick scan w PCR reflex     Status: None   Collection Time: 07/04/15 11:58 AM  Result Value Ref Range Status   C Diff antigen NEGATIVE NEGATIVE Final   C Diff toxin NEGATIVE NEGATIVE Final   C Diff interpretation Negative for toxigenic C. difficile  Final  Gastrointestinal Panel by PCR , Stool     Status: None   Collection Time: 07/04/15 11:58  AM  Result Value Ref Range Status   Campylobacter species NOT DETECTED NOT DETECTED Final   Plesimonas shigelloides NOT DETECTED NOT DETECTED Final   Salmonella species NOT DETECTED NOT DETECTED Final   Yersinia enterocolitica NOT DETECTED NOT DETECTED Final   Vibrio species NOT DETECTED NOT DETECTED Final   Vibrio cholerae NOT DETECTED NOT DETECTED Final   Enteroaggregative E coli (EAEC) NOT DETECTED NOT DETECTED Final   Enteropathogenic E coli (EPEC) NOT DETECTED NOT DETECTED Final   Enterotoxigenic E coli (ETEC) NOT DETECTED NOT DETECTED Final   Shiga like toxin producing E coli (STEC) NOT DETECTED NOT DETECTED Final   E. coli O157 NOT DETECTED NOT DETECTED Final   Shigella/Enteroinvasive E coli (EIEC) NOT DETECTED NOT DETECTED Final   Cryptosporidium NOT DETECTED NOT DETECTED Final   Cyclospora cayetanensis NOT DETECTED NOT DETECTED Final   Entamoeba histolytica NOT DETECTED NOT DETECTED Final   Giardia lamblia NOT DETECTED NOT DETECTED Final   Adenovirus  F40/41 NOT DETECTED NOT DETECTED Final   Astrovirus NOT DETECTED NOT DETECTED Final   Norovirus GI/GII NOT DETECTED NOT DETECTED Final   Rotavirus A NOT DETECTED NOT DETECTED Final   Sapovirus (I, II, IV, and V) NOT DETECTED NOT DETECTED Final     Labs: Basic Metabolic Panel:  Recent Labs Lab 06/29/15 1946 07/03/15 1347 07/03/15 1417 07/04/15 0340 07/05/15 0612  NA 139 138 142 139 138  K 3.9 4.5 4.5 3.9 4.4  CL 107 109 107 111 112*  CO2 24 20*  --  22 20*  GLUCOSE 146* 198* 196* 155* 149*  BUN 28* 30* 31* 22* 10  CREATININE 1.55* 3.42* 3.40* 1.86* 1.07  CALCIUM 9.3 8.2*  --  8.4* 8.7*   Liver Function Tests:  Recent Labs Lab 07/03/15 1347  AST 30  ALT 30  ALKPHOS 58  BILITOT 0.4  PROT 5.7*  ALBUMIN 3.1*   CBC:  Recent Labs Lab 06/29/15 1946 07/03/15 1347 07/03/15 1417 07/04/15 0340  WBC 8.1 8.5  --  6.1  NEUTROABS 5.0 6.3  --   --   HGB 12.9* 12.3* 12.6* 11.7*  HCT 36.4* 35.4* 37.0* 33.8*  MCV 80.4 80.6  --  81.3  PLT 161 124*  --  126*    Signed:  Barton Dubois MD.  [facility] 07/05/2015, 2:06 PM

## 2015-07-05 NOTE — Progress Notes (Signed)
Pt given discharge instructions, prescriptions, and care notes. Pt verbalized understanding AEB no further questions or concerns at this time. IV was discontinued, no redness, pain, or swelling noted at this time. Telemetry discontinued and Centralized Telemetry was notified. Pt left the floor via wheelchair with staff in stable condition. 

## 2015-07-08 LAB — CULTURE, BLOOD (ROUTINE X 2)
CULTURE: NO GROWTH
CULTURE: NO GROWTH

## 2015-07-10 DIAGNOSIS — M150 Primary generalized (osteo)arthritis: Secondary | ICD-10-CM | POA: Diagnosis not present

## 2015-07-10 DIAGNOSIS — R42 Dizziness and giddiness: Secondary | ICD-10-CM | POA: Diagnosis not present

## 2015-07-10 DIAGNOSIS — I10 Essential (primary) hypertension: Secondary | ICD-10-CM | POA: Diagnosis not present

## 2015-07-10 DIAGNOSIS — I2510 Atherosclerotic heart disease of native coronary artery without angina pectoris: Secondary | ICD-10-CM | POA: Diagnosis not present

## 2015-07-10 DIAGNOSIS — N183 Chronic kidney disease, stage 3 (moderate): Secondary | ICD-10-CM | POA: Diagnosis not present

## 2015-07-16 ENCOUNTER — Encounter: Payer: Self-pay | Admitting: Cardiovascular Disease

## 2015-07-16 ENCOUNTER — Ambulatory Visit (INDEPENDENT_AMBULATORY_CARE_PROVIDER_SITE_OTHER): Payer: Medicare Other | Admitting: Cardiovascular Disease

## 2015-07-16 VITALS — BP 116/70 | HR 52 | Ht 72.0 in | Wt 191.1 lb

## 2015-07-16 DIAGNOSIS — E785 Hyperlipidemia, unspecified: Secondary | ICD-10-CM | POA: Diagnosis not present

## 2015-07-16 DIAGNOSIS — I739 Peripheral vascular disease, unspecified: Secondary | ICD-10-CM | POA: Diagnosis not present

## 2015-07-16 NOTE — Patient Instructions (Signed)

## 2015-07-16 NOTE — Progress Notes (Signed)
Cardiology Office Note Date:  07/16/2015   ID:  Ricky [name], DOB 04/30/1935, MRN [id number]  PCP:  [name], MD  Cardiologist:  Sherren Mocha, MD    Chief Complaint  Patient presents with  . Hypotension     History of Present Illness: Ricky [name] is a 80 y.o. male who presents for Follow-up evaluation. The patient has been followed for lower extremity peripheral arterial disease. Last year he was seen with chest pain and he had an echocardiogram and stress test performed. His symptoms resolved and his stress test was interpreted as low-risk. We elected to continue with medical therapy considering his lack of symptoms. He had done well until a recent hospitalization with SIRS, dehydration, and acute kidney injury. Symptoms resolved with conservative management. Lisinopril has been discontinued. He returns today for follow-up.  He is feeling better, still with some generalized weakness. No recurrent fever or chills. No chest pain, shortness of breath, or leg swelling. He continues to have intermittent pain in the left calf especially with walking. No recent change in his symptoms.   Past Medical History  Diagnosis Date  . HTN (hypertension)   . HLD (hyperlipidemia)   . Prostate cancer ([place])   . PVD (peripheral vascular disease) [facility])     Past Surgical History  Procedure Laterality Date  . Cholecystectomy    . Back surgery  1994    cervical    Current Outpatient Prescriptions  Medication Sig Dispense Refill  . atenolol (TENORMIN) 25 MG tablet Take 25 mg by mouth daily.    . cilostazol (PLETAL) 100 MG tablet Take 100 mg by mouth 2 (two) times daily.    Marland Kitchen gabapentin (NEURONTIN) 300 MG capsule Take 300 mg by mouth 2 (two) times daily.    Marland Kitchen loperamide (IMODIUM) 2 MG capsule Take 1 capsule (2 mg total) by mouth every 4 (four) hours as needed for diarrhea or loose stools (do not take more than 8mg  in 24 hours). 30 capsule 0  . metoCLOPramide (REGLAN) 10 MG tablet Take 10 mg by  mouth at bedtime.  3  . saccharomyces boulardii (FLORASTOR) 250 MG capsule Take 1 capsule (250 mg total) by mouth 2 (two) times daily. 60 capsule 0  . simvastatin (ZOCOR) 40 MG tablet Take 40 mg by mouth at bedtime.     . tamsulosin (FLOMAX) 0.4 MG CAPS capsule Take 0.4 mg by mouth daily.     No current facility-administered medications for this visit.    Allergies:   Aspirin   Social History:  The patient  reports that he quit smoking about 23 years ago. He does not have any smokeless tobacco history on file. He reports that he does not drink alcohol or use illicit drugs.   Family History:  The patient's  family history includes Heart attack in his brother, father, and mother; Heart disease in his brother, father, and mother.    ROS:  Please see the history of present illness.  Otherwise, review of systems is positive for Hearing loss, back pain, muscle pain, dizziness, leg pain.  All other systems are reviewed and negative.    PHYSICAL EXAM: VS:  BP 116/70 mmHg  Pulse 52  Ht 6' (1.829 m)  Wt 191 lb 1.9 oz (86.691 kg)  BMI 25.91 kg/m2 , BMI Body mass index is 25.91 kg/(m^2). GEN: Well nourished, well developed, in no acute distress HEENT: normal Neck: no JVD, no masses. No carotid bruits Cardiac: RRR without murmur or gallop  Respiratory:  clear to auscultation bilaterally, normal work of breathing GI: soft, nontender, nondistended, + BS MS: no deformity or atrophy Ext: no pretibial edema Skin: warm and dry, no rash Neuro:  Strength and sensation are intact Psych: euthymic mood, full affect  EKG:  EKG is ordered today. The ekg ordered today shows sinus bradycardia 53 bpm, first-degree AV block, otherwise within normal limits.  Recent Labs: 07/03/2015: ALT 30 07/04/2015: Hemoglobin 11.7*; Platelets 126* 07/05/2015: BUN 10; Creatinine, Ser 1.07; Potassium 4.4; Sodium 138   Lipid Panel  No results found for: CHOL, TRIG, HDL, CHOLHDL, VLDL, LDLCALC, LDLDIRECT     Wt Readings from Last 3 Encounters:  07/16/15 191 lb 1.9 oz (86.691 kg)  08/27/14 182 lb 1.9 oz (82.609 kg)  08/14/14 184 lb (83.462 kg)     Cardiac Studies Reviewed: 2D Echo 08-14-2014: Study Conclusions  - Left ventricle: The cavity size was normal. Wall thickness was  normal. Systolic function was normal. The estimated ejection  fraction was in the range of 55% to 60%. Wall motion was normal;  there were no regional wall motion abnormalities. Doppler  parameters are consistent with abnormal left ventricular  relaxation (grade 1 diastolic dysfunction). - Pulmonary arteries: PA peak pressure: 32 mm Hg (S).  Impressions:  - Normal LV function; grade 1 diastolic dysfunction; mild TR.  Myoview 08-14-2014: Study Highlights     Nuclear stress EF: 59%.  Blood pressure demonstrated a hypertensive response to exercise.  This is a low risk study.  Findings consistent with prior myocardial infarction with peri-infarct ischemia.  The left ventricular ejection fraction is normal (55-65%).  Small area of inferolateral wall infarct at mid and basal level with mild peri infarct ischemia EF 59%     ASSESSMENT AND PLAN: 1.  Lower extremity peripheral arterial disease: Last ABIs reviewed and they are in the mild range. He has known occlusion of the left SFA. Continue medical management considering his stable and mild symptoms. He has been unable to take aspirin because of nosebleeds. He will continue with cilostazol. Patient takes a statin drug.  2. Essential hypertension: Lisinopril has been discontinued. Blood pressure is in the low-normal range. I reviewed his hospital records and his creatinine was as high as 3.5 mg/dL. Would stay off of lisinopril because of risk of recurrent acute kidney injury.  3. Hyperlipidemia: Followed by his primary care physician. Treated with simvastatin.  Current medicines are reviewed with the patient today.  The patient does not have concerns  regarding medicines.  Labs/ tests ordered today include:   Orders Placed This Encounter  Procedures  . EKG 12-Lead    Disposition:   FU one year  Signed, Sherren Mocha, MD  07/16/2015 12:57 PM    [address], [place], [place]  [address] Phone: [phone]; Fax: [phone]

## 2015-07-23 DIAGNOSIS — I2510 Atherosclerotic heart disease of native coronary artery without angina pectoris: Secondary | ICD-10-CM | POA: Diagnosis not present

## 2015-07-23 DIAGNOSIS — I70209 Unspecified atherosclerosis of native arteries of extremities, unspecified extremity: Secondary | ICD-10-CM | POA: Diagnosis not present

## 2015-07-23 DIAGNOSIS — M150 Primary generalized (osteo)arthritis: Secondary | ICD-10-CM | POA: Diagnosis not present

## 2015-07-23 DIAGNOSIS — E7800 Pure hypercholesterolemia, unspecified: Secondary | ICD-10-CM | POA: Diagnosis not present

## 2015-07-23 DIAGNOSIS — I10 Essential (primary) hypertension: Secondary | ICD-10-CM | POA: Diagnosis not present

## 2015-07-25 DIAGNOSIS — C61 Malignant neoplasm of prostate: Secondary | ICD-10-CM | POA: Diagnosis not present

## 2015-07-25 DIAGNOSIS — R3914 Feeling of incomplete bladder emptying: Secondary | ICD-10-CM | POA: Diagnosis not present

## 2015-07-25 DIAGNOSIS — N401 Enlarged prostate with lower urinary tract symptoms: Secondary | ICD-10-CM | POA: Diagnosis not present

## 2015-08-01 ENCOUNTER — Other Ambulatory Visit: Payer: Self-pay | Admitting: *Deleted

## 2015-08-01 ENCOUNTER — Other Ambulatory Visit: Payer: Self-pay

## 2015-08-01 MED ORDER — ATENOLOL 25 MG PO TABS: 25.0000 mg | ORAL_TABLET | Freq: Every day | ORAL | Status: DC

## 2015-08-01 NOTE — Telephone Encounter (Signed)
Discontinue Date:  08/27/2014 [place] User:  Barkley Boards, RN Discontinue Reason:  Discontinued by provider   Written Date:  08/17/14 Expiration Date:  08/17/15     Start Date:  08/17/14 End Date:  08/27/14     Ordering Provider:  Sherren Mocha, MD Authorizing Provider:  Sherren Mocha, MD Ordering User:  Deliah Boston Via, LPN

## 2015-08-29 DIAGNOSIS — R7302 Impaired glucose tolerance (oral): Secondary | ICD-10-CM | POA: Diagnosis not present

## 2015-08-29 DIAGNOSIS — M2550 Pain in unspecified joint: Secondary | ICD-10-CM | POA: Diagnosis not present

## 2015-08-29 DIAGNOSIS — I639 Cerebral infarction, unspecified: Secondary | ICD-10-CM | POA: Diagnosis not present

## 2015-08-29 DIAGNOSIS — I70209 Unspecified atherosclerosis of native arteries of extremities, unspecified extremity: Secondary | ICD-10-CM | POA: Diagnosis not present

## 2015-08-29 DIAGNOSIS — E7800 Pure hypercholesterolemia, unspecified: Secondary | ICD-10-CM | POA: Diagnosis not present

## 2015-08-29 DIAGNOSIS — R42 Dizziness and giddiness: Secondary | ICD-10-CM | POA: Diagnosis not present

## 2015-08-29 DIAGNOSIS — I10 Essential (primary) hypertension: Secondary | ICD-10-CM | POA: Diagnosis not present

## 2015-08-29 DIAGNOSIS — I2510 Atherosclerotic heart disease of native coronary artery without angina pectoris: Secondary | ICD-10-CM | POA: Diagnosis not present

## 2015-09-02 DIAGNOSIS — R3912 Poor urinary stream: Secondary | ICD-10-CM | POA: Diagnosis not present

## 2015-09-02 DIAGNOSIS — N401 Enlarged prostate with lower urinary tract symptoms: Secondary | ICD-10-CM | POA: Diagnosis not present

## 2015-09-02 DIAGNOSIS — R300 Dysuria: Secondary | ICD-10-CM | POA: Diagnosis not present

## 2015-09-02 DIAGNOSIS — R3911 Hesitancy of micturition: Secondary | ICD-10-CM | POA: Diagnosis not present

## 2015-09-17 DIAGNOSIS — E7800 Pure hypercholesterolemia, unspecified: Secondary | ICD-10-CM | POA: Diagnosis not present

## 2015-09-17 DIAGNOSIS — I2510 Atherosclerotic heart disease of native coronary artery without angina pectoris: Secondary | ICD-10-CM | POA: Diagnosis not present

## 2015-09-17 DIAGNOSIS — I10 Essential (primary) hypertension: Secondary | ICD-10-CM | POA: Diagnosis not present

## 2015-09-17 DIAGNOSIS — I70209 Unspecified atherosclerosis of native arteries of extremities, unspecified extremity: Secondary | ICD-10-CM | POA: Diagnosis not present

## 2015-09-19 DIAGNOSIS — I2510 Atherosclerotic heart disease of native coronary artery without angina pectoris: Secondary | ICD-10-CM | POA: Diagnosis not present

## 2015-09-19 DIAGNOSIS — E7800 Pure hypercholesterolemia, unspecified: Secondary | ICD-10-CM | POA: Diagnosis not present

## 2015-09-19 DIAGNOSIS — I10 Essential (primary) hypertension: Secondary | ICD-10-CM | POA: Diagnosis not present

## 2015-09-19 DIAGNOSIS — E1165 Type 2 diabetes mellitus with hyperglycemia: Secondary | ICD-10-CM | POA: Diagnosis not present

## 2015-10-01 DIAGNOSIS — I2510 Atherosclerotic heart disease of native coronary artery without angina pectoris: Secondary | ICD-10-CM | POA: Diagnosis not present

## 2015-10-01 DIAGNOSIS — I10 Essential (primary) hypertension: Secondary | ICD-10-CM | POA: Diagnosis not present

## 2015-10-01 DIAGNOSIS — E7800 Pure hypercholesterolemia, unspecified: Secondary | ICD-10-CM | POA: Diagnosis not present

## 2015-10-01 DIAGNOSIS — E1165 Type 2 diabetes mellitus with hyperglycemia: Secondary | ICD-10-CM | POA: Diagnosis not present

## 2015-12-05 DIAGNOSIS — E7800 Pure hypercholesterolemia, unspecified: Secondary | ICD-10-CM | POA: Diagnosis not present

## 2015-12-05 DIAGNOSIS — I2510 Atherosclerotic heart disease of native coronary artery without angina pectoris: Secondary | ICD-10-CM | POA: Diagnosis not present

## 2015-12-05 DIAGNOSIS — E119 Type 2 diabetes mellitus without complications: Secondary | ICD-10-CM | POA: Diagnosis not present

## 2015-12-05 DIAGNOSIS — I10 Essential (primary) hypertension: Secondary | ICD-10-CM | POA: Diagnosis not present

## 2015-12-05 DIAGNOSIS — E1165 Type 2 diabetes mellitus with hyperglycemia: Secondary | ICD-10-CM | POA: Diagnosis not present

## 2016-02-19 ENCOUNTER — Ambulatory Visit (INDEPENDENT_AMBULATORY_CARE_PROVIDER_SITE_OTHER): Payer: Self-pay | Admitting: Orthopaedic Surgery

## 2016-03-31 ENCOUNTER — Ambulatory Visit (INDEPENDENT_AMBULATORY_CARE_PROVIDER_SITE_OTHER): Payer: Self-pay | Admitting: Orthopaedic Surgery

## 2016-07-01 ENCOUNTER — Encounter: Payer: Self-pay | Admitting: Cardiovascular Disease

## 2016-07-22 ENCOUNTER — Ambulatory Visit: Payer: Medicare Other | Admitting: Cardiovascular Disease

## 2016-07-23 ENCOUNTER — Encounter: Payer: Self-pay | Admitting: Cardiovascular Disease

## 2016-07-31 ENCOUNTER — Encounter: Payer: Self-pay | Admitting: *Deleted

## 2016-08-02 NOTE — Progress Notes (Signed)
Cardiology Office Note Date:  08/03/2016   ID:  Ricky [name], DOB 14-Feb-1935, MRN [id number]  PCP:  Ricky Spurling, MD  Cardiologist:  Ricky Mocha, MD    Chief Complaint  Patient presents with  . Follow-up     History of Present Illness: Ricky [name] is a 81 y.o. male who presents for follow-up of PAD. He has chronic left SFA occlusion and has been managed conservatively with minimal symptoms. He is here alone today. He was diagnosed with Type II DM since his visit one year ago, but hasn't required medication to date. Blood glucose readings have been reading 160-200 and he is going to call Ricky [name] with readings soon.  Today, he denies symptoms of palpitations, chest pain,  orthopnea, PND, lower extremity edema, dizziness, or syncope. He does admit to shortness of breath with ambulation. He is primarily limited by back and leg pain. He also has some pain intermittently in his right forearm that is unrelated to physical exertion. No pain in the right chest or shoulder.   Past Medical History:  Diagnosis Date  . HLD (hyperlipidemia)   . HTN (hypertension)   . Prostate cancer ([facility])   . PVD (peripheral vascular disease) ([place])     Past Surgical History:  Procedure Laterality Date  . BACK SURGERY  1994   cervical  . CHOLECYSTECTOMY      Current Outpatient Prescriptions  Medication Sig Dispense Refill  . atenolol (TENORMIN) 25 MG tablet Take 1 tablet (25 mg total) by mouth daily. 90 tablet 3  . cilostazol (PLETAL) 100 MG tablet Take 100 mg by mouth 2 (two) times daily.    . naproxen (NAPROSYN) 375 MG tablet Take 375 mg by mouth 2 (two) times daily as needed (pain).    . simvastatin (ZOCOR) 40 MG tablet Take 40 mg by mouth at bedtime.     . tamsulosin (FLOMAX) 0.4 MG CAPS capsule Take 0.4 mg by mouth daily.     No current facility-administered medications for this visit.     Allergies:   Aspirin   Social History:  The patient  reports that he quit smoking about 24 years ago.  He has never used smokeless tobacco. He reports that he does not drink alcohol or use drugs.   Family History:  The patient's  family history includes Heart attack in his brother, father, and mother; Heart disease in his brother, father, and mother.    ROS:  Please see the history of present illness.  Otherwise, review of systems is positive for leg pain, back pain, shortness of breath, hearing loss, dizziness, sweating, snoring, balance problems.  All other systems are reviewed and negative.    PHYSICAL EXAM: VS:  BP 116/64   Pulse (!) 52   Ht 6' (1.829 m)   Wt 195 lb 12.8 oz (88.8 kg)   BMI 26.56 kg/m  , BMI Body mass index is 26.56 kg/m. GEN: Well nourished, well developed, pleasant elderly male in no acute distress  HEENT: normal  Neck: no JVD, no masses. No carotid bruits Cardiac: Bradycardic and regular without murmur or gallop                Respiratory:  clear to auscultation bilaterally, normal work of breathing GI: soft, nontender, nondistended, + BS MS: no deformity or atrophy  Ext: no pretibial edema Skin: warm and dry, no rash Neuro:  Strength and sensation are intact Psych: euthymic mood, full affect  EKG:  EKG is ordered today. The  ekg ordered today shows Sinus bradycardia 52 bpm with sinus arrhythmia and 1st AV block, otherwise normal  Recent Labs: No results found for requested labs within last 8760 hours.   Lipid Panel  No results found for: CHOL, TRIG, HDL, CHOLHDL, VLDL, LDLCALC, LDLDIRECT    Wt Readings from Last 3 Encounters:  08/03/16 195 lb 12.8 oz (88.8 kg)  07/16/15 191 lb 1.9 oz (86.7 kg)  08/27/14 182 lb 1.9 oz (82.6 kg)     Cardiac Studies Reviewed: Nuclear stress test 08/14/2014: Study Highlights    Nuclear stress EF: 59%.  Blood pressure demonstrated a hypertensive response to exercise.  This is a low risk study.  Findings consistent with prior myocardial infarction with peri-infarct ischemia.  The left ventricular ejection  fraction is normal (55-65%).   Small area of inferolateral wall infarct at mid and basal level with mild peri infarct ischemia EF 59%      ASSESSMENT AND PLAN: 1.  Lower extremity PAD with intermittent claudication:The patient is asymptomatic at present, likely because he is more limited by his back problems. He will continue on cilostazol, unable to take aspirin because of nose bleeding. Patient is on a statin drug. His blood pressure is well controlled.  2. HTN: Controlled on atenolol  3. Hyperlipidemia: Treated with simvastatin. Followed by his primary physician  4. Type II DM: we discussed diet, blood glucose monitoring. He will FU with Ricky [name].   Current medicines are reviewed with the patient today.  The patient does not have concerns regarding medicines.  Labs/ tests ordered today include:   Orders Placed This Encounter  Procedures  . EKG 12-Lead    Disposition:   FU one year  Signed, Ricky Mocha, MD  08/03/2016 9:03 AM    [facility] [address], [place], [place]  [address] Phone: [phone]; Fax: [phone]

## 2016-08-03 ENCOUNTER — Encounter: Payer: Self-pay | Admitting: Cardiovascular Disease

## 2016-08-03 ENCOUNTER — Encounter (INDEPENDENT_AMBULATORY_CARE_PROVIDER_SITE_OTHER): Payer: Self-pay

## 2016-08-03 ENCOUNTER — Ambulatory Visit (INDEPENDENT_AMBULATORY_CARE_PROVIDER_SITE_OTHER): Payer: Medicare Other | Admitting: Cardiovascular Disease

## 2016-08-03 VITALS — BP 116/64 | HR 52 | Ht 72.0 in | Wt 195.8 lb

## 2016-08-03 DIAGNOSIS — I739 Peripheral vascular disease, unspecified: Secondary | ICD-10-CM | POA: Diagnosis not present

## 2016-08-03 DIAGNOSIS — I10 Essential (primary) hypertension: Secondary | ICD-10-CM | POA: Diagnosis not present

## 2016-08-03 NOTE — Patient Instructions (Signed)

## 2016-10-11 IMAGING — MR MR PROSTATE WO/W CM
23 of 53 series · 23 of 53 positions shown · IV contrast (yes)
Comparison: Biopsy report of 05/29/2013.

CLINICAL DATA: Prostate cancer.  Elevated PSA.

EXAM:
MR PROSTATE WITHOUT AND WITH CONTRAST
TECHNIQUE: Multiplanar multisequence MRI images were obtained of the pelvis
centered about the prostate. Pre and post contrast images were
obtained.
CONTRAST:  19mL MULTIHANCE GADOBENATE DIMEGLUMINE 529 MG/ML IV SOLN

[Series 3: bSSFP fat-sat · axial · 6.0mm · 0.86mm/px · 1 of 44 slices shown]
[im 1/44]
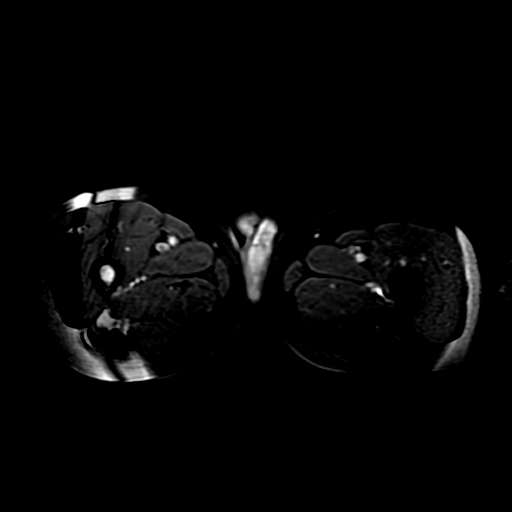

[Series 4: T1 · axial · 6.0mm · 0.86mm/px · 1 of 44 slices shown (1 of 2)]
[im 1/44]
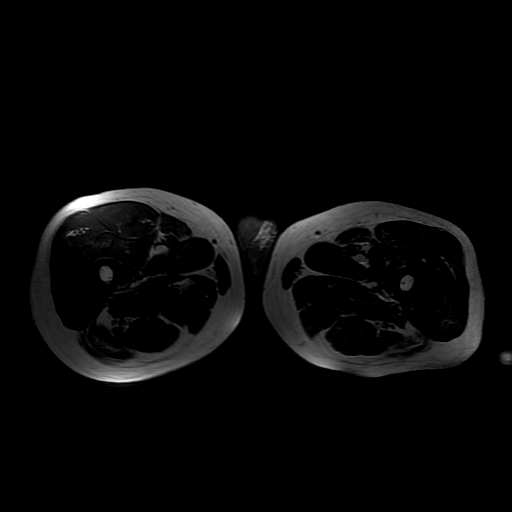

[Series 5: T2 · axial · 3.0mm · 0.29mm/px · 1 of 27 slices shown (1 of 3)]
[im 1/27]
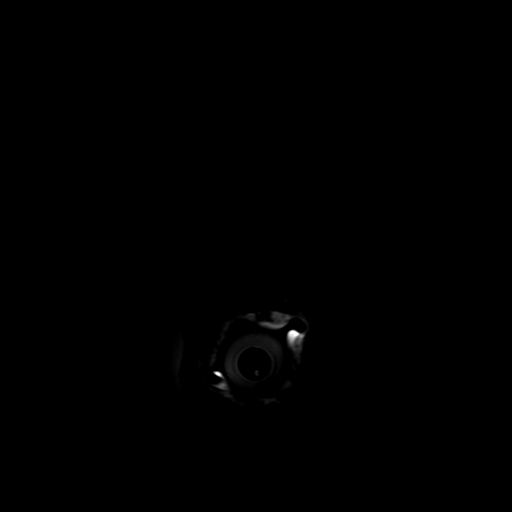

[Series 7: T1 · axial · 3.0mm · 0.29mm/px · 1 of 27 slices shown (2 of 2)]
[im 1/27]
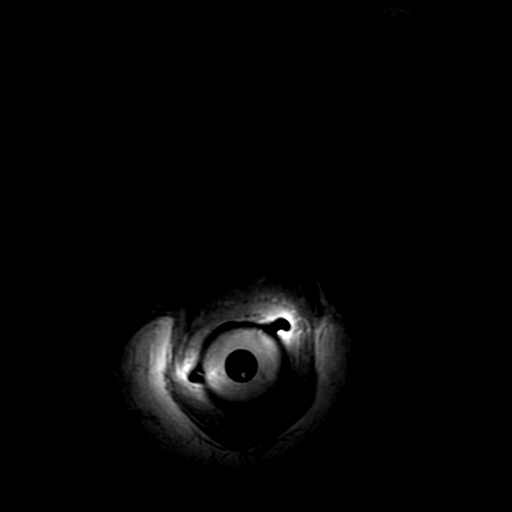

[Series 8: T2 · sagittal · 4.0mm · 0.29mm/px · 1 of 25 slices shown (2 of 3)]
[im 1/25]
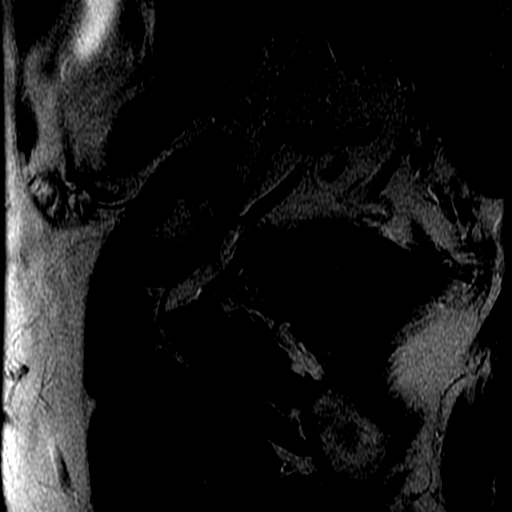

[Series 9: T2 · coronal · 4.0mm · 0.29mm/px · 1 of 24 slices shown (3 of 3)]
[im 1/24]
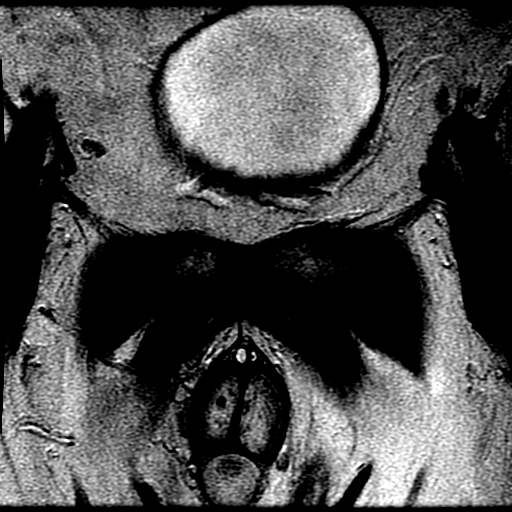

[Series 10: DWI · axial · 3.0mm · 0.59mm/px · 1 of 54 slices shown (1 of 6)]
[im 1/54]
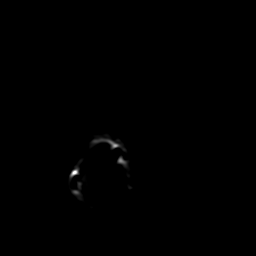

[Series 11: DWI · axial · 3.0mm · 0.59mm/px · 1 of 54 slices shown (2 of 6)]
[im 1/54]
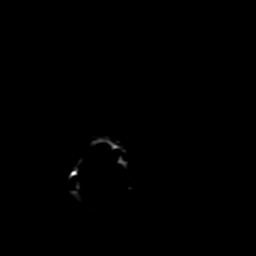

[Series 12: DWI · axial · 3.0mm · 0.59mm/px · 1 of 49 slices shown (3 of 6)]
[im 1/49]
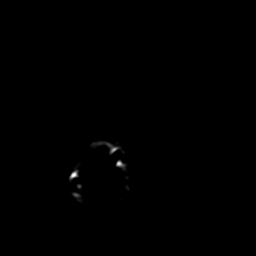

[Series 1000: DWI · axial · 3.0mm · 0.59mm/px · 1 of 27 slices shown (4 of 6)]
[im 1/27]
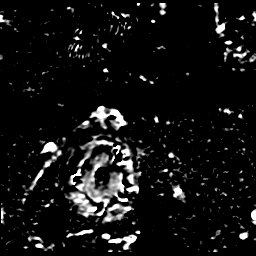

[Series 1100: DWI · axial · 3.0mm · 0.59mm/px · 1 of 27 slices shown (5 of 6)]
[im 1/27]
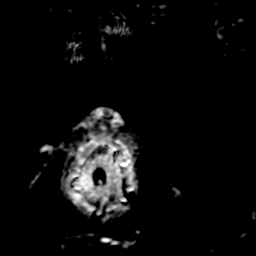

[Series 1200: DWI · axial · 3.0mm · 0.59mm/px · 1 of 27 slices shown (6 of 6)]
[im 1/27]
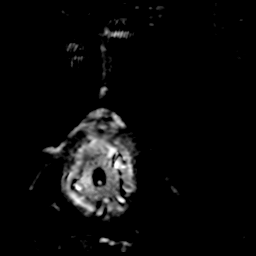

[((id)/(id)/1)-((id)/(id)/1) · axial · 3.0mm · 0.43mm/px · 1 of 66 slices shown (1 of 11)]
[im 1/66]
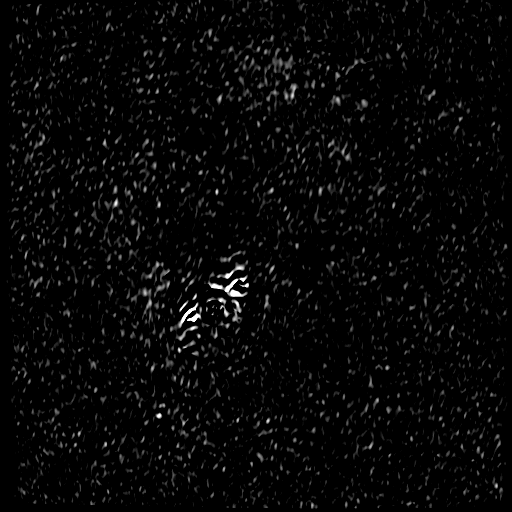

[((id)/(id)/1)-((id)/(id)/1) · axial · 3.0mm · 0.43mm/px · 1 of 68 slices shown (2 of 11)]
[im 1/68]
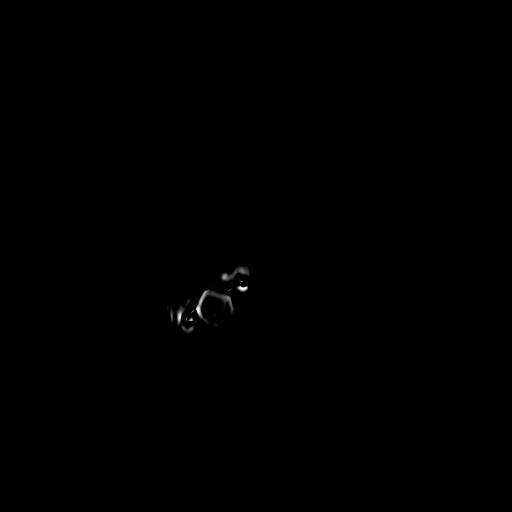

[((id)/(id)/1)-((id)/(id)/1) · axial · 3.0mm · 0.43mm/px · 1 of 68 slices shown (3 of 11)]
[im 1/68]
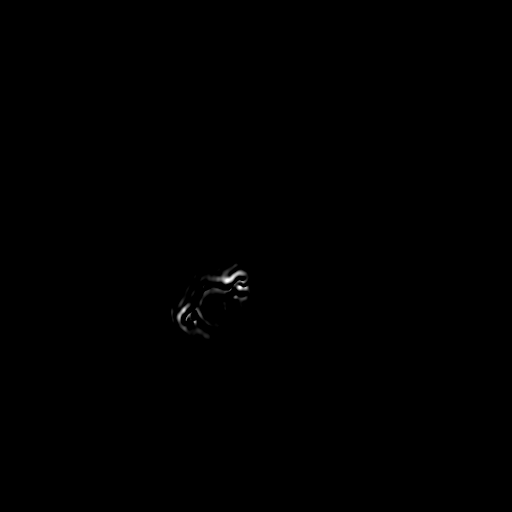

[((id)/(id)/1)-((id)/(id)/1) · axial · 3.0mm · 0.43mm/px · 1 of 68 slices shown (4 of 11)]
[im 1/68]
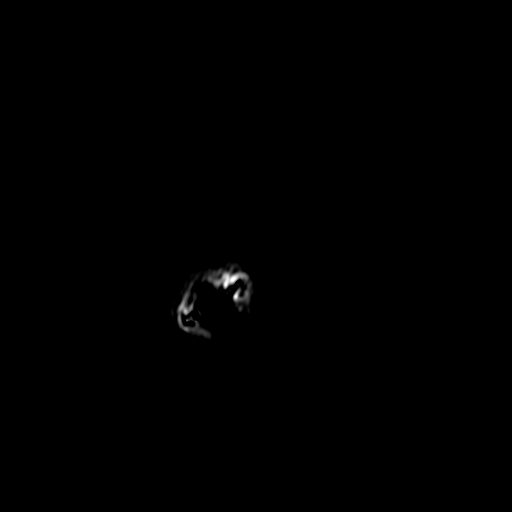

[((id)/(id)/1)-((id)/(id)/1) · axial · 3.0mm · 0.43mm/px · 1 of 68 slices shown (5 of 11)]
[im 1/68]
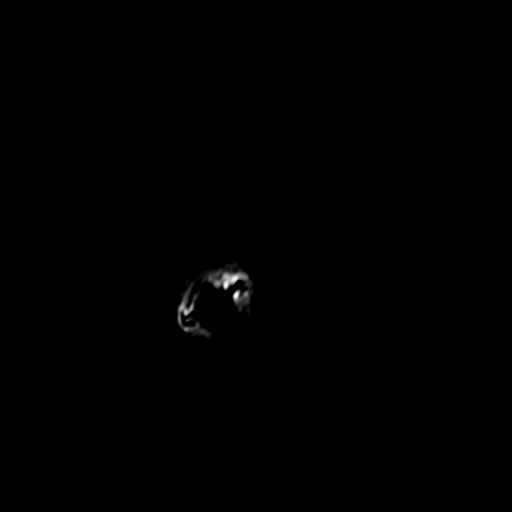

[((id)/(id)/1)-((id)/(id)/1) · axial · 3.0mm · 0.43mm/px · 1 of 68 slices shown (6 of 11)]
[im 1/68]
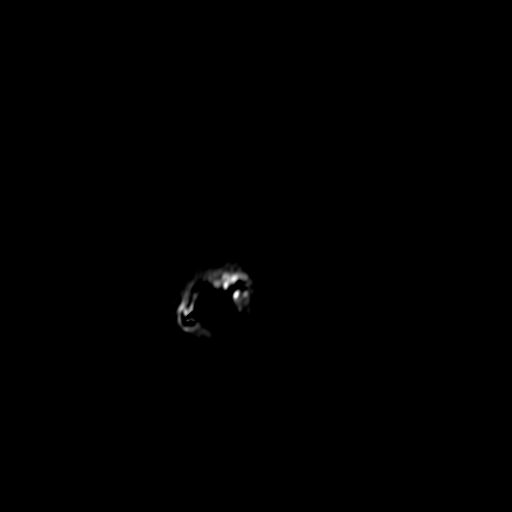

[((id)/(id)/1)-((id)/(id)/1) · axial · 3.0mm · 0.43mm/px · 1 of 68 slices shown (7 of 11)]
[im 1/68]
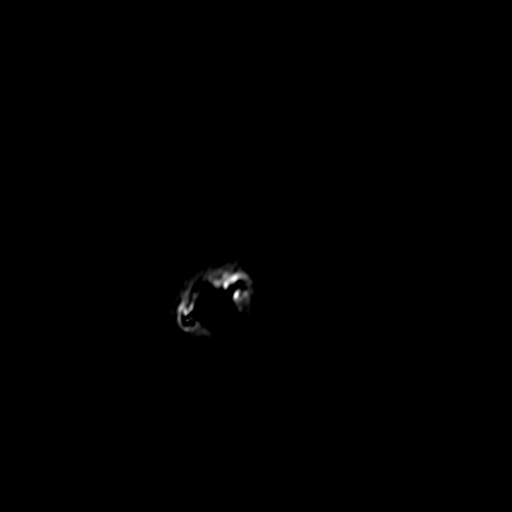

[((id)/(id)/1)-((id)/(id)/1) · axial · 3.0mm · 0.43mm/px · 1 of 68 slices shown (8 of 11)]
[im 1/68]
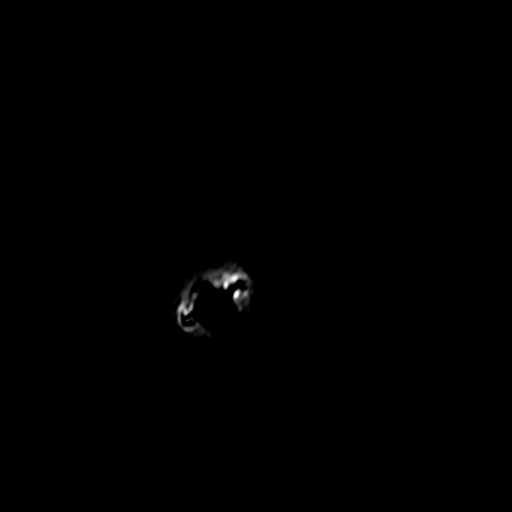

[((id)/(id)/1)-((id)/(id)/1) · axial · 3.0mm · 0.43mm/px · 1 of 68 slices shown (9 of 11)]
[im 1/68]
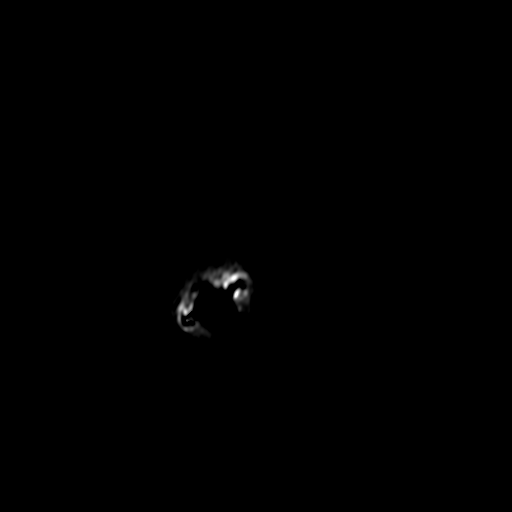

[((id)/(id)/1)-((id)/(id)/1) · axial · 3.0mm · 0.43mm/px · 1 of 68 slices shown (10 of 11)]
[im 1/68]
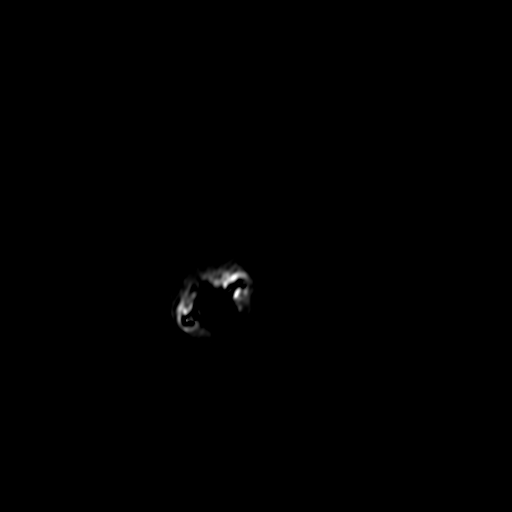

[((id)/(id)/1)-((id)/(id)/1) · axial · 3.0mm · 0.43mm/px · 1 of 68 slices shown (11 of 11)]
[im 1/68]
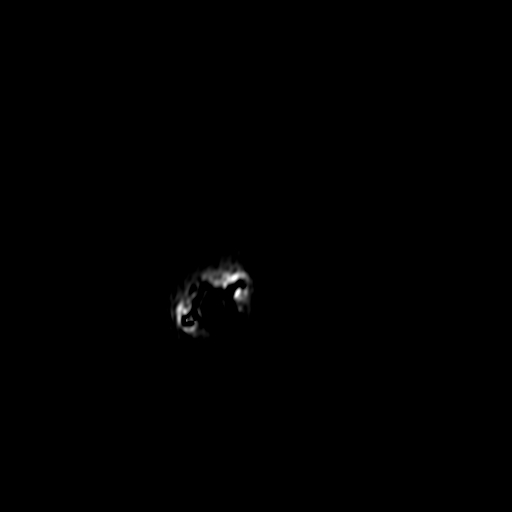

[23 of 53 positions shown; findings below may reference images not displayed]

FINDINGS: Prostate: Demonstrates moderate central gland enlargement and
heterogeneity, consistent with benign prostatic hyperplasia.

An apparent left lateral base 9 mm peripheral zone nodule on image
11/series 5 is favored to arise from the central gland and represent
a benign prostatic hyperplasia nodule. This is felt to indent the
peripheral zone.

A left apex probable BPH (also indenting into the peripheral zone) 5
mm nodule on image 19/series 5 corresponds to restricted diffusion
on image 7/series 8944.

The most equivocal finding is a 2 mm focus of T2 hypo intensity
within the lateral right apex. Example image 20/series 5 and image
9/series 8. Also image 11/series 9. This corresponds to subtle
restricted diffusion on image 7/series 8944. Although this is
adjacent to the central gland, is favored to arise in the peripheral
zone.

No early post-contrast enhancement in any of the lesions.

Transcapsular spread:  Absent

Seminal vesicle involvement: Absent

Neurovascular bundle involvement: Absent

Pelvic adenopathy: Absent

Bone metastasis: Absent

Other findings: No significant free fluid. Normal urinary bladder
and pelvic bowel loops. A probable lipoma within the low right
lateral pelvic musculature measures 3.1 cm on image 1/series 4.
IMPRESSION: 1. No findings to suggest large volume peripheral zone carcinoma.
Two left-sided nodules which are favored to arise from the central
gland (and represent BPH nodules) impressing into the peripheral
zone. An equivocal 2mm right apical nodule could represent a focus
of small volume carcinoma or also arise from the central gland and
represent BPH.
2.  No evidence of locally advanced or pelvic metastatic disease.

## 2017-02-23 ENCOUNTER — Ambulatory Visit (INDEPENDENT_AMBULATORY_CARE_PROVIDER_SITE_OTHER): Payer: Self-pay

## 2017-02-23 ENCOUNTER — Ambulatory Visit (INDEPENDENT_AMBULATORY_CARE_PROVIDER_SITE_OTHER): Payer: Medicare Other | Admitting: Orthopaedic Surgery

## 2017-02-23 ENCOUNTER — Encounter (INDEPENDENT_AMBULATORY_CARE_PROVIDER_SITE_OTHER): Payer: Self-pay | Admitting: Orthopaedic Surgery

## 2017-02-23 VITALS — BP 116/52 | HR 62 | Ht 72.0 in | Wt 194.0 lb

## 2017-02-23 DIAGNOSIS — M25562 Pain in left knee: Secondary | ICD-10-CM | POA: Diagnosis not present

## 2017-02-23 DIAGNOSIS — G8929 Other chronic pain: Secondary | ICD-10-CM

## 2017-02-23 DIAGNOSIS — M545 Low back pain: Secondary | ICD-10-CM

## 2017-02-23 DIAGNOSIS — M25561 Pain in right knee: Secondary | ICD-10-CM | POA: Diagnosis not present

## 2017-03-10 ENCOUNTER — Ambulatory Visit (INDEPENDENT_AMBULATORY_CARE_PROVIDER_SITE_OTHER): Payer: Medicare Other | Admitting: Orthopaedic Surgery

## 2017-03-16 ENCOUNTER — Ambulatory Visit
Admission: RE | Admit: 2017-03-16 | Discharge: 2017-03-16 | Disposition: A | Discharge status: Home / Self Care | Payer: Medicare Other | Source: Ambulatory Visit | Attending: Surgery | Admitting: Surgery

## 2017-03-16 DIAGNOSIS — M545 Low back pain: Principal | ICD-10-CM

## 2017-03-16 DIAGNOSIS — G8929 Other chronic pain: Secondary | ICD-10-CM

## 2017-03-30 ENCOUNTER — Encounter (INDEPENDENT_AMBULATORY_CARE_PROVIDER_SITE_OTHER): Payer: Self-pay | Admitting: Orthopaedic Surgery

## 2017-03-30 ENCOUNTER — Ambulatory Visit (INDEPENDENT_AMBULATORY_CARE_PROVIDER_SITE_OTHER): Payer: Medicare Other | Admitting: Orthopaedic Surgery

## 2017-03-30 VITALS — BP 151/79 | HR 54 | Ht 72.0 in | Wt 200.0 lb

## 2017-03-30 DIAGNOSIS — M5126 Other intervertebral disc displacement, lumbar region: Secondary | ICD-10-CM

## 2017-03-30 DIAGNOSIS — M47816 Spondylosis without myelopathy or radiculopathy, lumbar region: Secondary | ICD-10-CM | POA: Diagnosis not present

## 2017-03-30 NOTE — Progress Notes (Signed)
Office Visit Note   Patient: Ricky [name]           Date of Birth: [birth year]           MRN: [id number] Visit Date: 03/30/2017              Requested by: [name], MD [address] [place], [place] [address] PCP: [name], MD   Assessment & Plan: Visit Diagnoses:  1. Facet degeneration of lumbar region   2. Protrusion of lumbar intervertebral disc     Plan: Patient has some facet degeneration with anterolisthesis at L4-5 broad-based disc bulge.  Small disc extrusion at L5-S1 with minimal nerve root displacement.  I think he is a good candidate for single epidural steroid injection to help with his symptoms which is been bothering him for greater than 3 months not responsive to activity modification, walking program, anti-inflammatories.  I plan to recheck him in 2 months.  Follow-Up Instructions: No Follow-up on file.   Orders:  No orders of the defined types were placed in this encounter.  No orders of the defined types were placed in this encounter.     Procedures: No procedures performed   Clinical Data: No additional findings.   Subjective: Chief Complaint  Patient presents with  . Lower Back - Pain, Follow-up    MRI review    HPI 82 year old male returns post MRI with back and bilateral leg pain he has some left knee symptoms left at times he has weakness in his right leg that radiates down to his foot.  He has had previous cervical fusions in the 80s and 90s multiple levels.  He denies any myelopathic symptoms.  Back pain is gradually gotten worse over several months he is taken Naprosyn without relief also Tylenol.  Review of Systems  14 pt review of systems positive for previous multi level cervical fusions.,  PAD, history of dehydration, encephalopathy, hypertension, hyperlipidemia, facet degeneration in the lumbar spine, acute kidney injury otherwise negative as it pertains to HPI.   Objective: Vital Signs: BP (!) 151/79    Pulse (!) 54   Ht 6' (1.829 m)   Wt 200 lb (90.7 kg)   BMI 27.12 kg/m   Physical Exam  Constitutional: He is oriented to person, place, and time. He appears well-developed and well-nourished.  HENT:  Head: Normocephalic and atraumatic.  Eyes: EOM are normal. Pupils are equal, round, and reactive to light.  Neck: No tracheal deviation present. No thyromegaly present.  Cardiovascular: Normal rate.  Pulmonary/Chest: Effort normal. He has no wheezes.  Abdominal: Soft. Bowel sounds are normal.  Neurological: He is alert and oriented to person, place, and time.  Skin: Skin is warm and dry. Capillary refill takes less than 2 seconds.  Psychiatric: He has a normal mood and affect. His behavior is normal. Judgment and thought content normal.    Ortho Exam well-healed anterior cervical incision with limited cervical range of motion for multilevel fusion.  No brachial plexus tenderness no supraclavicular lymphadenopathy no rash over exposed skin.  Minimal crepitus left knee.  Negative logroll the hips.  Normal heel toe gait.  He has some discomfort with straight leg raising at 80 degrees on the left.  Some sciatic notch tenderness both right and left.  Specialty Comments:  No specialty comments available.  Imaging: CLINICAL DATA:  Low back pain.  Right leg weakness and numbness.  EXAM: MRI LUMBAR SPINE WITHOUT CONTRAST  TECHNIQUE: Multiplanar, multisequence MR imaging of  the lumbar spine was performed. No intravenous contrast was administered.  COMPARISON:  Radiographs dated 02/23/2017  FINDINGS: Segmentation:  Standard.  Alignment:  2 mm spondylolisthesis at L4-5.  Otherwise, physiologic.  Vertebrae:  No fracture, evidence of discitis, or bone lesion.  Conus medullaris and cauda equina: Conus extends to the L2 level. Conus and cauda equina appear normal.  Paraspinal and other soft tissues: Sigmoid diverticulosis. Otherwise, negative.  Disc levels:  T11-12 through  L2-3: Normal.  L3-4: Normal disc. Minimal degenerative changes of the facet joints.  L4-5: 2 mm spondylolisthesis with no bulging of the uncovered disc. Moderately severe bilateral facet arthritis, left greater than right. No neural impingement. No foraminal stenosis.  L5-S1: Small broad-based disc bulge with a tiny disc extrusion extending inferiorly in the midline without neural impingement, best seen on image 8 of series 7.  IMPRESSION: 1. Moderately severe bilateral degenerative facet arthritis at L4-5 with 2 mm spondylolisthesis but without neural impingement. 2. Tiny central disc extrusion at L5-S1 without neural impingement.   Electronically Signed   By: Lorriane Shire M.D.   On: 03/16/2017 08:30    PMFS History: Patient Active Problem List   Diagnosis Date Noted  . Protrusion of lumbar intervertebral disc 03/30/2017  . Facet degeneration of lumbar region 03/30/2017  . Lactic acidosis   . Hypotension   . HLD (hyperlipidemia)   . SIRS (systemic inflammatory response syndrome) ([facility])   . AKI (acute kidney injury) ([place]) 07/03/2015  . Sepsis ([facility]) 07/03/2015  . Acute encephalopathy 07/03/2015  . Dehydration   . Diarrhea   . ATHEROSCLEROSIS W /INT CLAUDICATION 04/14/2010  . UNSPECIFIED PERIPHERAL VASCULAR DISEASE 04/01/2010   Past Medical History:  Diagnosis Date  . HLD (hyperlipidemia)   . HTN (hypertension)   . Prostate cancer ([facility])   . PVD (peripheral vascular disease) ([facility])     Family History  Problem Relation Age of Onset  . Heart disease Mother   . Heart attack Mother   . Heart disease Father   . Heart attack Father   . Heart disease Brother   . Heart attack Brother   . Coronary artery disease Unknown     Past Surgical History:  Procedure Laterality Date  . BACK SURGERY  1994   cervical  . CHOLECYSTECTOMY     Social History   Occupational History  . Not on file  Tobacco Use  . Smoking status: Former Smoker    Last attempt to quit:  02/03/1992    Years since quitting: 25.1  . Smokeless tobacco: Never Used  Substance and Sexual Activity  . Alcohol use: No  . Drug use: No  . Sexual activity: Not on file

## 2017-04-26 ENCOUNTER — Encounter (INDEPENDENT_AMBULATORY_CARE_PROVIDER_SITE_OTHER): Payer: Self-pay | Admitting: Orthopaedic Surgery

## 2017-04-26 NOTE — Progress Notes (Signed)
Office Visit Note   Patient: Ricky [name]           Date of Birth: 26-Apr-1935           MRN: [id number] Visit Date: 02/23/2017              Requested by: [name], MD [address] [place], [place] [address] PCP: [name], MD   Assessment & Plan: Visit Diagnoses:  1. Chronic bilateral low back pain, with sciatica presence unspecified   2. Chronic pain of both knees     Plan: We will proceed with lumbar MRI scan for evaluation of his back and leg pain with weakness.  Office follow-up after MRI scan.  Follow-Up Instructions: Return in about 2 weeks (around 03/09/2017) for review lumbar mri with Ziomara Birenbaum.   Orders:  Orders Placed This Encounter  Procedures  . XR Lumbar Spine 2-3 Views  . XR Knee 1-2 Views Right  . XR Knee 1-2 Views Left  . MR Lumbar Spine w/o contrast   No orders of the defined types were placed in this encounter.     Procedures: No procedures performed   Clinical Data: No additional findings.   Subjective: No chief complaint on file.   HPI 82 year old male with back and bilateral leg pain with weakness.  He has more symptoms on the left but at times is noticed right leg numbness that radiates down to his foot.  Previous multilevel cervical fusion which is solid.  He denies fever or chills.  No associated bowel or bladder symptoms.  No myelopathic symptoms.  Pain is worse with activity turning and twisting.  Review of Systems 14 point review of systems positive for previous multilevel cervical fusion.  PAD, history of encephalopathy, hypertension hyperlipidemia.  Lumbar facet degeneration.  History of acute kidney injury.  History of dehydration and acute encephalopathy associated with acute septic episode.  Otherwise negative as it pertains HPI.   Objective: Vital Signs: BP (!) 116/52   Pulse 62   Ht 6' (1.829 m)   Wt 194 lb (88 kg)   BMI 26.31 kg/m   Physical Exam  Constitutional: He is oriented to person, place, and  time. He appears well-developed and well-nourished.  HENT:  Head: Normocephalic and atraumatic.  Eyes: Pupils are equal, round, and reactive to light. EOM are normal.  Neck: No tracheal deviation present. No thyromegaly present.  Cardiovascular: Normal rate.  Pulmonary/Chest: Effort normal. He has no wheezes.  Abdominal: Soft. Bowel sounds are normal.  Neurological: He is alert and oriented to person, place, and time.  Skin: Skin is warm and dry. Capillary refill takes less than 2 seconds.  Psychiatric: He has a normal mood and affect. His behavior is normal. Judgment and thought content normal.    Ortho Exam cervical incisions well-healed.  He has some decreased cervical rotation flexion and extension due to his multilevel fusion.  Some pain with straight leg raising at 80 degrees on the left.  Sciatic notch tenderness both right and left.  Normal logroll to the hips.  Knee and ankle jerk are intact.  Mild crepitus with left knee range of motion and slight medial joint line tenderness.  No inguinal lymphadenopathy knees reach full extension.  Ankle range of motion and resisted strength testing for dorsiflexion plantarflexion is normal.  Specialty Comments:  No specialty comments available.  Imaging: Xr Knee 1-2 Views Left  Result Date: 04/26/2017 Standing AP both knees, sunrise patella and lateral left knee obtained.  This shows normal bone architecture.  Soft tissue are normal.  Minimal medial compartment narrowing without osteophyte formation. Impression: normal left knee x-rays  Xr Knee 1-2 Views Right  Result Date: 04/26/2017 Standing AP both knees lateral right knee obtained and reviewed.  This shows slight medial joint line narrowing.  No acute changes. Impression: Normal right knee x-rays other than slight medial joint line narrowing.  Xr Lumbar Spine 2-3 Views  Result Date: 04/26/2017 AP lateral lumbar spine x-rays and spot L5-S1 images reviewed.  Postsurgical changes left iliac  wing from donor cervical fusion.  Narrowing L5-S1, spurring at L3-4 and 2 mm anterolisthesis at L4-5 noted with facet degenerative changes. Impression: Lumbar disc degeneration multilevel without acute changes.  Postsurgical iliac changes from donor site harvest left iliac wing.    PMFS History: Patient Active Problem List   Diagnosis Date Noted  . Protrusion of lumbar intervertebral disc 03/30/2017  . Facet degeneration of lumbar region 03/30/2017  . Lactic acidosis   . Hypotension   . HLD (hyperlipidemia)   . SIRS (systemic inflammatory response syndrome) ([facility])   . AKI (acute kidney injury) ([facility]) 07/03/2015  . Sepsis ([facility]) 07/03/2015  . Acute encephalopathy 07/03/2015  . Dehydration   . Diarrhea   . ATHEROSCLEROSIS W /INT CLAUDICATION 04/14/2010  . UNSPECIFIED PERIPHERAL VASCULAR DISEASE 04/01/2010   Past Medical History:  Diagnosis Date  . HLD (hyperlipidemia)   . HTN (hypertension)   . Prostate cancer ([facility])   . PVD (peripheral vascular disease) ([facility])     Family History  Problem Relation Age of Onset  . Heart disease Mother   . Heart attack Mother   . Heart disease Father   . Heart attack Father   . Heart disease Brother   . Heart attack Brother   . Coronary artery disease Unknown     Past Surgical History:  Procedure Laterality Date  . BACK SURGERY  1994   cervical  . CHOLECYSTECTOMY     Social History   Occupational History  . Not on file  Tobacco Use  . Smoking status: Former Smoker    Last attempt to quit: 02/03/1992    Years since quitting: 25.2  . Smokeless tobacco: Never Used  Substance and Sexual Activity  . Alcohol use: No  . Drug use: No  . Sexual activity: Not on file

## 2017-04-28 ENCOUNTER — Encounter (INDEPENDENT_AMBULATORY_CARE_PROVIDER_SITE_OTHER): Payer: Medicare Other | Admitting: Physical Medicine and Rehabilitation

## 2017-05-04 ENCOUNTER — Other Ambulatory Visit (HOSPITAL_COMMUNITY): Payer: Self-pay | Admitting: Endocrinology

## 2017-05-04 DIAGNOSIS — R42 Dizziness and giddiness: Secondary | ICD-10-CM

## 2017-05-06 ENCOUNTER — Ambulatory Visit (HOSPITAL_COMMUNITY)
Admission: RE | Admit: 2017-05-06 | Discharge: 2017-05-06 | Disposition: A | Discharge status: Home / Self Care | Payer: Medicare Other | Source: Ambulatory Visit | Attending: Vascular Surgery | Admitting: Vascular Surgery

## 2017-05-06 DIAGNOSIS — R42 Dizziness and giddiness: Secondary | ICD-10-CM | POA: Insufficient documentation

## 2017-05-06 DIAGNOSIS — I6523 Occlusion and stenosis of bilateral carotid arteries: Secondary | ICD-10-CM | POA: Insufficient documentation

## 2017-05-12 ENCOUNTER — Encounter (INDEPENDENT_AMBULATORY_CARE_PROVIDER_SITE_OTHER): Payer: Self-pay | Admitting: Physical Medicine and Rehabilitation

## 2017-06-14 ENCOUNTER — Encounter (INDEPENDENT_AMBULATORY_CARE_PROVIDER_SITE_OTHER): Payer: Self-pay | Admitting: Physical Medicine and Rehabilitation

## 2017-09-14 ENCOUNTER — Ambulatory Visit: Payer: Medicare Other | Admitting: Neurology

## 2017-09-27 ENCOUNTER — Ambulatory Visit: Payer: Medicare HMO | Admitting: Cardiovascular Disease

## 2017-09-27 ENCOUNTER — Encounter: Payer: Self-pay | Admitting: Cardiovascular Disease

## 2017-09-27 VITALS — BP 128/76 | HR 47 | Ht 72.0 in | Wt 194.8 lb

## 2017-09-27 DIAGNOSIS — R55 Syncope and collapse: Secondary | ICD-10-CM | POA: Diagnosis not present

## 2017-09-27 DIAGNOSIS — I739 Peripheral vascular disease, unspecified: Secondary | ICD-10-CM
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
## 2017-09-27 NOTE — Patient Instructions (Addendum)
Medication Instructions:  1) STOP ATENOLOL 2) STOP FLOMAX  Labwork: None ordered  Testing/Procedures: Your provider has requested that you have an echocardiogram. Echocardiography is a painless test that uses sound waves to create images of your heart. It provides your doctor with information about the size and shape of your heart and how well your heart's chambers and valves are working. This procedure takes approximately one hour. There are no restrictions for this procedure.    Your physician has recommended that you wear an event monitor. Event monitors are medical devices that record the heart's electrical activity. Doctors most often [place] these monitors to diagnose arrhythmias. Arrhythmias are problems with the speed or rhythm of the heartbeat. The monitor is a small, portable device. You can wear one while you do your normal daily activities. This is usually used to diagnose what is causing palpitations/syncope (passing out).  Follow-Up: Your provider recommends that you schedule a follow-up appointment in [place] with Richardson Dopp, PA.  Any Other Special Instructions Will Be Listed Below (If Applicable).     If you need a refill on your cardiac medications before your next appointment, please call your pharmacy.

## 2017-09-30 ENCOUNTER — Other Ambulatory Visit: Payer: Self-pay

## 2017-09-30 ENCOUNTER — Ambulatory Visit (HOSPITAL_COMMUNITY): Payer: Medicare HMO | Attending: Cardiology

## 2017-09-30 ENCOUNTER — Ambulatory Visit (INDEPENDENT_AMBULATORY_CARE_PROVIDER_SITE_OTHER): Payer: Medicare HMO

## 2017-09-30 DIAGNOSIS — I517 Cardiomegaly: Secondary | ICD-10-CM | POA: Insufficient documentation

## 2017-09-30 DIAGNOSIS — I10 Essential (primary) hypertension: Secondary | ICD-10-CM | POA: Diagnosis not present

## 2017-09-30 DIAGNOSIS — E785 Hyperlipidemia, unspecified: Secondary | ICD-10-CM | POA: Insufficient documentation

## 2017-09-30 DIAGNOSIS — Z720 Tobacco use: Secondary | ICD-10-CM | POA: Diagnosis not present

## 2017-09-30 DIAGNOSIS — R55 Syncope and collapse: Secondary | ICD-10-CM | POA: Diagnosis not present

## 2017-09-30 DIAGNOSIS — I340 Nonrheumatic mitral (valve) insufficiency: Secondary | ICD-10-CM | POA: Diagnosis not present

## 2017-09-30 DIAGNOSIS — E119 Type 2 diabetes mellitus without complications: Secondary | ICD-10-CM | POA: Diagnosis not present

## 2017-10-11 DIAGNOSIS — C61 Malignant neoplasm of prostate: Secondary | ICD-10-CM | POA: Diagnosis not present

## 2017-10-11 DIAGNOSIS — I6389 Other cerebral infarction: Secondary | ICD-10-CM | POA: Diagnosis not present

## 2017-10-11 DIAGNOSIS — E1151 Type 2 diabetes mellitus with diabetic peripheral angiopathy without gangrene: Secondary | ICD-10-CM | POA: Diagnosis not present

## 2017-10-11 DIAGNOSIS — Z23 Encounter for immunization: Secondary | ICD-10-CM | POA: Diagnosis not present

## 2017-10-11 DIAGNOSIS — M545 Low back pain: Secondary | ICD-10-CM | POA: Diagnosis not present

## 2017-10-11 DIAGNOSIS — R55 Syncope and collapse: Secondary | ICD-10-CM | POA: Diagnosis not present

## 2017-10-11 DIAGNOSIS — E7849 Other hyperlipidemia: Secondary | ICD-10-CM | POA: Diagnosis not present

## 2017-10-11 DIAGNOSIS — R42 Dizziness and giddiness: Secondary | ICD-10-CM | POA: Diagnosis not present

## 2017-10-11 DIAGNOSIS — I70219 Atherosclerosis of native arteries of extremities with intermittent claudication, unspecified extremity: Secondary | ICD-10-CM | POA: Diagnosis not present

## 2017-10-11 DIAGNOSIS — I10 Essential (primary) hypertension: Secondary | ICD-10-CM | POA: Diagnosis not present

## 2017-10-28 ENCOUNTER — Encounter: Payer: Self-pay | Admitting: Neurology

## 2017-10-28 ENCOUNTER — Other Ambulatory Visit: Payer: Self-pay

## 2017-10-28 ENCOUNTER — Telehealth: Payer: Self-pay | Admitting: Neurology

## 2017-10-28 ENCOUNTER — Ambulatory Visit (INDEPENDENT_AMBULATORY_CARE_PROVIDER_SITE_OTHER): Payer: Medicare HMO | Admitting: Neurology

## 2017-10-28 VITALS — BP 125/78 | HR 69 | Ht 72.0 in | Wt 194.5 lb

## 2017-10-28 DIAGNOSIS — E538 Deficiency of other specified B group vitamins: Secondary | ICD-10-CM

## 2017-10-28 DIAGNOSIS — R42 Dizziness and giddiness: Secondary | ICD-10-CM

## 2017-10-28 DIAGNOSIS — G4489 Other headache syndrome: Secondary | ICD-10-CM

## 2017-10-28 DIAGNOSIS — R55 Syncope and collapse: Secondary | ICD-10-CM | POA: Diagnosis not present
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
## 2017-10-28 NOTE — Telephone Encounter (Signed)
[employer] pending faxed clinical notes  °

## 2017-10-28 NOTE — Telephone Encounter (Signed)
I called to check the status. They did receive my fax clinical notes and it is still pending.

## 2017-10-28 NOTE — Telephone Encounter (Signed)
errro

## 2017-10-29 ENCOUNTER — Telehealth: Payer: Self-pay | Admitting: *Deleted

## 2017-10-29 LAB — SEDIMENTATION RATE: SED RATE: 13 mm/h (ref 0–30)

## 2017-10-29 LAB — VITAMIN B12: Vitamin B-12: 418 pg/mL (ref 232–1245)

## 2017-10-29 LAB — RPR: RPR: NONREACTIVE

## 2017-10-29 NOTE — Telephone Encounter (Signed)
error 

## 2017-10-29 NOTE — Telephone Encounter (Signed)
LVM informing the patient that his blood work results are unremarkable. Left number for questions and advised this office closes at noon today.

## 2017-11-01 NOTE — Telephone Encounter (Signed)
Mcarthur Rossetti Josem Kaufmann: [phone] (exp. 10/28/17 to 11/27/17) order sent to GI. They will reach out to the pt to schedule.

## 2017-11-02 ENCOUNTER — Ambulatory Visit (INDEPENDENT_AMBULATORY_CARE_PROVIDER_SITE_OTHER): Payer: Medicare Other | Admitting: Orthopaedic Surgery

## 2017-11-03 ENCOUNTER — Other Ambulatory Visit: Payer: Medicare HMO

## 2017-11-12 ENCOUNTER — Other Ambulatory Visit: Payer: Medicare HMO

## 2017-11-17 ENCOUNTER — Other Ambulatory Visit: Payer: Medicare HMO

## 2017-12-03 DEATH — deceased

## 2017-12-07 ENCOUNTER — Other Ambulatory Visit: Payer: Medicare HMO

## 2017-12-28 ENCOUNTER — Ambulatory Visit: Payer: Medicare HMO | Admitting: Neurology

## 2017-12-28 ENCOUNTER — Telehealth: Payer: Self-pay | Admitting: Neurology

## 2017-12-28 DIAGNOSIS — R55 Syncope and collapse: Secondary | ICD-10-CM | POA: Diagnosis not present
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
## 2017-12-28 NOTE — Telephone Encounter (Signed)
I called the patient. The EEG study is normal.   MRI of the brain is pending.  This study was ordered in September, still has not been done yet.

## 2018-01-04 ENCOUNTER — Telehealth: Payer: Self-pay | Admitting: *Deleted

## 2018-01-04 ENCOUNTER — Ambulatory Visit (INDEPENDENT_AMBULATORY_CARE_PROVIDER_SITE_OTHER): Payer: Medicare HMO | Admitting: Physician Assistant

## 2018-01-04 ENCOUNTER — Encounter: Payer: Self-pay | Admitting: Physician Assistant

## 2018-01-04 VITALS — BP 140/70 | HR 65 | Ht 72.0 in | Wt 194.8 lb

## 2018-01-04 DIAGNOSIS — I10 Essential (primary) hypertension: Secondary | ICD-10-CM

## 2018-01-04 DIAGNOSIS — R55 Syncope and collapse: Secondary | ICD-10-CM | POA: Diagnosis not present

## 2018-01-04 DIAGNOSIS — R0683 Snoring: Secondary | ICD-10-CM

## 2018-01-04 DIAGNOSIS — I739 Peripheral vascular disease, unspecified: Secondary | ICD-10-CM

## 2018-01-04 DIAGNOSIS — R6889 Other general symptoms and signs: Secondary | ICD-10-CM | POA: Diagnosis not present
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
## 2018-01-04 NOTE — Telephone Encounter (Signed)
-----   Message from Jacinta Shoe, [place] sent at 01/04/2018  2:13 PM EST ----- Pt saw Richardson Dopp, PA-C and needs to be scheduled for a home sleep study. Dx: snoring.

## 2018-01-04 NOTE — Patient Instructions (Addendum)
Medication Instructions:  Your physician recommends that you continue on your current medications as directed. Please refer to the Current Medication list given to you today.  If you need a refill on your cardiac medications before your next appointment, please call your pharmacy.   Lab work: NONE If you have labs (blood work) drawn today and your tests are completely normal, you will receive your results only by: Marland Kitchen MyChart Message (if you have MyChart) OR . A paper copy in the mail If you have any lab test that is abnormal or we need to change your treatment, we will call you to review the results.  Testing/Procedures: Your physician has recommended that you have a HOME sleep study. This test records several body functions during sleep, including: brain activity, eye movement, oxygen and carbon dioxide blood levels, heart rate and rhythm, breathing rate and rhythm, the flow of air through your mouth and nose, snoring, body muscle movements, and chest and belly movement.    Follow-Up: At [facility], you and your health needs are our priority.  As part of our continuing mission to provide you with exceptional heart care, we have created designated Provider Care Teams.  These Care Teams include your primary Cardiologist (physician) and Advanced Practice Providers (APPs -  Physician Assistants and Nurse Practitioners) who all work together to provide you with the care you need, when you need it. You will need a follow up appointment in:  1 years.  Please call our office 2 months in advance to schedule this appointment.  You may see Sherren Mocha, MD or one of the following Advanced Practice Providers on your designated Care Team: Richardson Dopp, PA-C [place], [place] . Daune Perch, NP  Any Other Special Instructions Will Be Listed Below (If Applicable).

## 2018-01-04 NOTE — Telephone Encounter (Signed)
Staff message sent to Ricky [name] ok to schedule HST. [employer] does not require a PA. 

## 2018-01-12 NOTE — Telephone Encounter (Signed)
Patient is aware and agreeable to Home Sleep Study through [facility]. Patient is scheduled for 01/19/18 at 2 pm to pick up home sleep kit and meet with Respiratory therapist at [facility]. Patient is aware that if this appointment date and time does not work for them they should contact Artis Delay directly at [phone]. Patient is aware that a sleep packet will be sent from [facility] in week. Patient is agreeable to treatment and thankful for call.

## 2018-01-19 ENCOUNTER — Ambulatory Visit (HOSPITAL_BASED_OUTPATIENT_CLINIC_OR_DEPARTMENT_OTHER): Payer: Medicare HMO | Attending: Physician Assistant | Admitting: Cardiology

## 2018-01-19 DIAGNOSIS — G4733 Obstructive sleep apnea (adult) (pediatric): Secondary | ICD-10-CM | POA: Insufficient documentation

## 2018-01-19 DIAGNOSIS — R0902 Hypoxemia: Secondary | ICD-10-CM | POA: Insufficient documentation

## 2018-01-21 NOTE — Procedures (Signed)
    Patient Name: Ricky [name], Ricky [name] Date: 01/19/2018 Gender: Male D.O.B: [birth year] Age (years): 35 Referring Provider: Richardson Dopp Height (inches): 38 Interpreting Physician: Fransico Him MD, ABSM Weight (lbs): 196 RPSGT: Jonna Coup BMI: 27 MRN: [id number] Neck Size: 16.50  CLINICAL INFORMATION Sleep Study Type: HST  Indication for sleep study: N/A  Epworth Sleepiness Score: 1  SLEEP STUDY TECHNIQUE A multi-channel overnight portable sleep study was performed. The channels recorded were: nasal airflow, thoracic respiratory movement, and oxygen saturation with a pulse oximetry. Snoring was also monitored.  MEDICATIONS Patient self administered medications include: N/A.  SLEEP ARCHITECTURE Patient was studied for 356.6 minutes. The sleep efficiency was 94.7 % and the patient was supine for 99.9%. The arousal index was 0.0 per hour.  RESPIRATORY PARAMETERS The overall AHI was 28.3 per hour, with a central apnea index of 0.0 per hour.  The oxygen nadir was 81% during sleep.  CARDIAC DATA Mean heart rate during sleep was 56.1 bpm.  IMPRESSIONS - Moderate obstructive sleep apnea occurred during this study (AHI = 28.3/h). - No significant central sleep apnea occurred during this study (CAI = 0.0/h). - Moderate oxygen desaturation was noted during this study (Min O2 = 81%). - Patient snored 23.6% during the sleep.  DIAGNOSIS - Obstructive Sleep Apnea (327.23 [G47.33 ICD-10]) - Nocturnal Hypoxemia (327.26 [G47.36 ICD-10])  RECOMMENDATIONS - Recommend in lab CPAP titration - Positional therapy avoiding supine position during sleep. - Avoid alcohol, sedatives and other CNS depressants that may worsen sleep apnea and disrupt normal sleep architecture. - Sleep hygiene should be reviewed to assess factors that may improve sleep quality. - Weight management and regular exercise should be initiated or continued.  [Electronically signed] 01/21/2018 10:02 PM  Fransico Him MD, ABSM Diplomate, American Board of Sleep Medicine

## 2018-01-24 ENCOUNTER — Other Ambulatory Visit (HOSPITAL_BASED_OUTPATIENT_CLINIC_OR_DEPARTMENT_OTHER): Payer: Self-pay

## 2018-01-24 DIAGNOSIS — R0683 Snoring: Secondary | ICD-10-CM

## 2018-01-25 ENCOUNTER — Telehealth: Payer: Self-pay | Admitting: *Deleted

## 2018-01-25 DIAGNOSIS — G4733 Obstructive sleep apnea (adult) (pediatric): Secondary | ICD-10-CM

## 2018-01-25 NOTE — Telephone Encounter (Signed)
-----   Message from Sueanne Margarita, MD sent at 01/21/2018 10:07 PM EST ----- Please let patient know that they have sleep apnea and recommend CPAP titration. Please set up titration in the sleep lab.

## 2018-01-31 ENCOUNTER — Telehealth: Payer: Self-pay | Admitting: Neurology

## 2018-01-31 MED ORDER — DIAZEPAM 2 MG PO TABS
2.0000 mg | ORAL_TABLET | Freq: Two times a day (BID) | ORAL | 1 refills | Status: DC
Start: 2018-01-31 — End: 2018-02-04

## 2018-01-31 NOTE — Telephone Encounter (Signed)
I called the patient.  The patient continues to have his usual dizziness and vertigo.  The patient will be placed on low-dose diazepam 2 mg twice daily, he does not wish to go through physical therapy currently, he has not yet had MRI of the brain but he plans on doing this in the next 3 weeks or so.
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
## 2018-01-31 NOTE — Telephone Encounter (Signed)
Informed patient of sleep study results and patient understanding was verbalized. Patient understands his sleep study showed  they have sleep apnea and recommend CPAP titration. Please set up titration in the sleep lab.  Pt is aware and agreeable to these results   Titration sent to precert 

## 2018-02-04 ENCOUNTER — Telehealth: Payer: Self-pay | Admitting: *Deleted

## 2018-02-04 MED ORDER — DIAZEPAM 2 MG PO TABS: 2.0000 mg | ORAL_TABLET | Freq: Two times a day (BID) | ORAL | 1 refills | Status: DC

## 2018-02-04 NOTE — Telephone Encounter (Signed)
Patient stopped by the office to request the recent RX that was sent to [facility] to go to [employer].  Please call patient if you have questions.

## 2018-02-04 NOTE — Telephone Encounter (Signed)
The patient is starting a new medication, for this reason the medication was sent to the local pharmacy, I will go ahead and call in a small prescription to the mail order pharmacy, I will not give a 90-day supply.

## 2018-02-08 ENCOUNTER — Telehealth: Payer: Self-pay | Admitting: *Deleted

## 2018-02-08 NOTE — Telephone Encounter (Signed)
-----   Message from Lauralee Evener, [place] sent at 02/01/2018  9:47 AM EST ----- Regarding: RE: pre cert Gae Bon check with patient to see if his insurance will be changing. [employer] states insurance eligibility will end in 2 weeks. ----- Message ----- From: Freada Bergeron, CMA Sent: 01/31/2018   5:15 PM EST To: Cv Div Sleep Studies Subject: pre cert                                       recommend CPAP titration

## 2018-02-08 NOTE — Telephone Encounter (Signed)
Called lmtcb on home phone.

## 2018-02-09 NOTE — Telephone Encounter (Signed)
Called lmtcb on home phone.

## 2018-02-14 ENCOUNTER — Telehealth: Payer: Self-pay | Admitting: Neurology

## 2018-02-14 NOTE — Telephone Encounter (Signed)
Requested a call back to further discuss message.

## 2018-02-14 NOTE — Telephone Encounter (Signed)
Pt states he is getting dizzy on his diazepam (VALIUM) 2 MG tablet  Please advise.

## 2018-02-15 ENCOUNTER — Telehealth: Payer: Self-pay | Admitting: *Deleted

## 2018-02-15 NOTE — Telephone Encounter (Signed)
-----   Message from Freada Bergeron, [place] sent at 02/15/2018  4:01 PM EST ----- Regarding: Insurance Patient says he changed insurance from [place] to medicare complete.   ZH#[id number] Customer service [phone] Hope this helps. ----- Message ----- From: Lauralee Evener, CMA Sent: 02/10/2018  11:24 AM EST To: Freada Bergeron, CMA Subject: RE: pre cert                                   Continue calling him and resend the request to me once you have spoken to him. ----- Message ----- From: Freada Bergeron, CMA Sent: 02/09/2018   4:27 PM EST To: Lauralee Evener, CMA Subject: RE: pre cert                                   Mariann Laster I have been calling this man he has not returned my calls. ----- Message ----- From: Lauralee Evener, CMA Sent: 02/09/2018   2:53 PM EST To: Freada Bergeron, CMA Subject: RE: pre cert                                   Please call patient to get new insurance information. Per [employer] policy expired. ----- Message ----- From: Freada Bergeron, CMA Sent: 01/31/2018   5:15 PM EST To: Cv Div Sleep Studies Subject: pre cert                                       recommend CPAP titration

## 2018-02-15 NOTE — Telephone Encounter (Signed)
Pt returned my call. He states since starting the valium he has been experiencing dizziness and blurred vision. Pt states the dizziness has been a constant issue, but feels like it has worsened since starting the medication.  Pt states the blurred vision is a new issue for him and noticed it began after starting the Valium. I advised I would fwd to Dr. Jannifer Franklin for him to review/advise. Pt was agreeable.

## 2018-02-15 NOTE — Addendum Note (Signed)
Addended by: Kathrynn Ducking on: 02/15/2018 01:04 PM   Modules accepted: Orders

## 2018-02-15 NOTE — Telephone Encounter (Signed)
Lvm requesting a call back to discuss pt's questions.

## 2018-02-15 NOTE — Telephone Encounter (Signed)
I called the patient, left a message, if the diazepam is making him feel worse, he is to stop the drug.  The patient had MRI of the brain ordered he has not yet had this, he also has not wish to undergo physical therapy, would recommend doing this at some point if the dizziness continues.

## 2018-02-15 NOTE — Telephone Encounter (Signed)
Staff message sent to Ricky [name] ok to schedule CPAP titration. Per [facility] web portal no PA is required.decision [id number].

## 2018-02-15 NOTE — Telephone Encounter (Addendum)
Reached out to the patient.Patient states his member ID# is [id number] Medicare Complete Customer service# is [phone]

## 2018-02-17 ENCOUNTER — Encounter: Payer: Self-pay | Admitting: *Deleted

## 2018-02-17 NOTE — Telephone Encounter (Signed)
Patient is scheduled for CPAP Titration on 03/22/18. Patient understands his titration study will be done at [facility] sleep lab. Patient understands he will receive a letter in a week or so detailing appointment, date, time, and location. Patient understands to call if he does not receive the letter  in a timely manner. Patient agrees with treatment and thanked me for call.

## 2018-02-17 NOTE — Telephone Encounter (Signed)
    Staff message sent to Gae Bon ok to schedule CPAP titration. Per [facility] web portal no PA is required.decision QA:[id number].

## 2018-03-04 ENCOUNTER — Ambulatory Visit: Payer: Medicare HMO | Admitting: Neurology

## 2018-03-07 MED ORDER — DIAZEPAM 2 MG PO TABS: 2.0000 mg | ORAL_TABLET | Freq: Two times a day (BID) | ORAL | 0 refills | Status: DC

## 2018-03-07 NOTE — Telephone Encounter (Signed)
Somewhat confused, the patient 2 weeks ago indicated that diazepam was causing severe side effects, now claims that he is doing great on the medication.  I will call in a small prescription for him.

## 2018-03-07 NOTE — Telephone Encounter (Signed)
Pt is requesting refill for diazepam. Pt said he not having any problems with the medication, now he feels better. Please call to advise. Pharmacy: [facility]

## 2018-03-07 NOTE — Addendum Note (Signed)
Addended by: Kathrynn Ducking on: 03/07/2018 05:08 PM   Modules accepted: Orders

## 2018-03-07 NOTE — Telephone Encounter (Signed)
[place] drug registry verified. Last refill for Diazepam 2 mg was written on 02/04/18 # 60 for a 30 day supply provided by Dr. Jannifer Franklin. Last o/v was 10/28/2017 and next f/u is scheduled for 04/13/18.

## 2018-03-22 ENCOUNTER — Ambulatory Visit (HOSPITAL_BASED_OUTPATIENT_CLINIC_OR_DEPARTMENT_OTHER): Payer: Medicare Other | Attending: Cardiology | Admitting: Cardiology

## 2018-03-22 VITALS — Ht 72.0 in | Wt 197.0 lb

## 2018-03-22 DIAGNOSIS — G4733 Obstructive sleep apnea (adult) (pediatric): Secondary | ICD-10-CM | POA: Insufficient documentation

## 2018-03-28 NOTE — Procedures (Signed)
   Patient Name: Ricky [name], Ricky [name] Date: 03/22/2018 Gender: Male D.O.B: April 27, 1935 Age (years): 62 Referring Provider: Fransico Him MD, ABSM Height (inches): 72 Interpreting Physician: Fransico Him MD, ABSM Weight (lbs): 197 RPSGT: Laren Everts BMI: 27 MRN: [id number] Neck Size: 16.50  CLINICAL INFORMATION  The patient is referred for a CPAP titration to treat sleep apnea.  SLEEP STUDY TECHNIQUE  As per the AASM Manual for the Scoring of Sleep and Associated Events v2.3 (April 2016) with a hypopnea requiring 4% desaturations. The channels recorded and monitored were frontal, central and occipital EEG, electrooculogram (EOG), submentalis EMG (chin), nasal and oral airflow, thoracic and abdominal wall motion, anterior tibialis EMG, snore microphone, electrocardiogram, and pulse oximetry. Continuous positive airway pressure (CPAP) was initiated at the beginning of the study and titrated to treat sleep-disordered breathing.  MEDICATIONS  Medications self-administered by patient taken the night of the study : N/A  TECHNICIAN COMMENTS  Comments added by technician: Patient was restless all through the night. Comments added by scorer: N/A  RESPIRATORY PARAMETERS  Optimal PAP Pressure (cm):15 AHI at Optimal Pressure (/hr):0.0 Overall Minimal O2 (%):92.0 Supine % at Optimal Pressure (%):100 Minimal O2 at Optimal Pressure (%):96.0  SLEEP ARCHITECTURE  The study was initiated at 9:32:59 PM and ended at 4:51:02 AM. Sleep onset time was 15.1 minutes and the sleep efficiency was 50.1%%. The total sleep time was 219.5 minutes. The patient spent 8.2%% of the night in stage N1 sleep, 88.8% in stage N2 sleep, 0.0% in stage N3 and 3% in REM.Stage REM latency was 80.0 minutes Wake after sleep onset was 203.4. Alpha intrusion was absent. Supine sleep was 99.32%.  CARDIAC DATA  The 2 lead EKG demonstrated sinus rhythm. The mean heart rate was 54.3 beats per minute. Other EKG findings include:  PVCs.  LEG MOVEMENT DATA  The total Periodic Limb Movements of Sleep (PLMS) were 0. The PLMS index was 0.0. A PLMS index of <15 is considered normal in adults.  IMPRESSIONS  - The optimal PAP pressure was 15 cm of water. - Central sleep apnea was not noted during this titration (CAI = 1.1/h). - Significant oxygen desaturations were not observed during this titration (min O2 = 92.0%). - The patient snored with moderate snoring volume during this titration study. - 2-lead EKG demonstrated: PVCs - Clinically significant periodic limb movements were not noted during this study. Arousals associated with PLMs were rare.  DIAGNOSIS  - Obstructive Sleep Apnea (327.23 [G47.33 ICD-10])  RECOMMENDATIONS  - Trial of CPAP therapy on 15 cm H2O with a Medium size Fisher&Paykel Full Face Mask Simplus mask and heated humidification. - Avoid alcohol, sedatives and other CNS depressants that may worsen sleep apnea and disrupt normal sleep architecture. - Sleep hygiene should be reviewed to assess factors that may improve sleep quality. - Weight management and regular exercise should be initiated or continued. - Return to Sleep Center for re-evaluation after 10 weeks of therapy  [Electronically signed] 03/28/2018 10:05 AM  Fransico Him MD, ABSM Diplomate, American Board of Sleep Medicine

## 2018-03-30 ENCOUNTER — Telehealth: Payer: Self-pay | Admitting: *Deleted

## 2018-03-30 NOTE — Telephone Encounter (Signed)
-----   Message from Ricky Margarita, MD sent at 03/28/2018 10:22 AM EST ----- Please let patient know that they had a successful PAP titration and let DME know that orders are in EPIC.  Please set up 10 week OV with me.

## 2018-03-30 NOTE — Telephone Encounter (Signed)
Informed patient of titration results and verbalized understanding was indicated. Patient understands his titration study showed they had a successful PAP titration and orders are in EPIC. Please set up 10 week OV with me.     Upon patient request DME selection is [facility]. Patient understands he will be contacted by [facility] to set up his cpap. Patient understands to call if [facility] does not contact him with new setup in a timely manner. Patient understands they will be called once confirmation has been received from [facility] that they have received their new machine to schedule 10 week follow up appointment.  [facility] notified of new cpap order  Please add to airview Patient was grateful for the call and thanked me.

## 2018-04-05 NOTE — Telephone Encounter (Signed)
Patient has a 10 week follow up appointment scheduled for 06/24/18. Patient understands he needs to keep this appointment for insurance compliance. [facility] will contact the patient.

## 2018-04-11 ENCOUNTER — Telehealth: Payer: Self-pay

## 2018-04-11 NOTE — Telephone Encounter (Signed)
Received call from phone staff. Pt called to report for the last two weeks he has been having dizziness along with blurred vision. He believes these side effects are related to his diazepam. Pt confirmed he had take 1 of his pills today but has not taken the second. I advised the pt to hold his second pill until Dr. Jannifer Franklin reviews and advises on how to proceed.  Pt was agreeable.

## 2018-04-11 NOTE — Telephone Encounter (Signed)
I called the patient.  The patient had concerns previously that the diazepam was causing side effects, he is to stop the medication at this time, if he continues to have troubles he will call me.

## 2018-04-13 ENCOUNTER — Ambulatory Visit: Payer: Medicare Other | Admitting: Neurology

## 2018-04-20 ENCOUNTER — Telehealth: Payer: Self-pay | Admitting: Neurology

## 2018-04-20 NOTE — Telephone Encounter (Signed)
[email or web address]@ [facility] has called stating pt came there this morning re: a 2:00 appointment.  Pt is unsure of where he is supposed to go this afternoon but informed Janett Billow it is an appointment that Dr Jannifer Franklin set up.  Janett Billow asked this note be placed in system for when pt see's Dr Jannifer Franklin in June.  No call back requested

## 2018-04-20 NOTE — Telephone Encounter (Signed)
Noted  

## 2018-04-20 NOTE — Telephone Encounter (Signed)
Events noted.  The patient did have some confusion on diazepam, he was to stop the medication.

## 2018-06-13 ENCOUNTER — Telehealth: Payer: Self-pay | Admitting: Cardiology

## 2018-06-13 NOTE — Telephone Encounter (Signed)
Unable to reach to reschedule appointment and get consent for vitrial appointment

## 2018-06-17 NOTE — Telephone Encounter (Signed)
Virtual visit is good

## 2018-06-17 NOTE — Telephone Encounter (Signed)
Reached out to patient and he states he does not have a smart phone but does want to still have his visit.

## 2018-06-17 NOTE — Telephone Encounter (Signed)
Should this be in person or virtual visit

## 2018-06-17 NOTE — Telephone Encounter (Signed)
New Message   Patient calling to confirm appointment believes he's coming in for appointment.  Please call patient to confirm

## 2018-06-20 ENCOUNTER — Telehealth: Payer: Self-pay

## 2018-06-20 NOTE — Telephone Encounter (Signed)
Called and left message for patient to return call regarding upcoming appointment. Needs to be a virtual visit with Dr Radford Pax and get verbal consent.

## 2018-06-22 NOTE — Telephone Encounter (Signed)

## 2018-06-23 ENCOUNTER — Encounter: Payer: Self-pay | Admitting: Cardiology

## 2018-06-23 DIAGNOSIS — G4733 Obstructive sleep apnea (adult) (pediatric): Secondary | ICD-10-CM

## 2018-06-23 HISTORY — DX: Obstructive sleep apnea (adult) (pediatric): G47.33

## 2018-06-23 NOTE — Progress Notes (Deleted)
Virtual Visit via Video Note   This visit type was conducted due to national recommendations for restrictions regarding the COVID-19 Pandemic (e.g. social distancing) in an effort to limit this patient's exposure and mitigate transmission in our community.  Due to his co-morbid illnesses, this patient is at least at moderate risk for complications without adequate follow up.  This format is felt to be most appropriate for this patient at this time.  All issues noted in this document were discussed and addressed.  A limited physical exam was performed with this format.  Please refer to the patient's chart for his consent to telehealth for [facility].    Evaluation Performed:  Follow-up visit  This visit type was conducted due to national recommendations for restrictions regarding the COVID-19 Pandemic (e.g. social distancing).  This format is felt to be most appropriate for this patient at this time.  All issues noted in this document were discussed and addressed.  No physical exam was performed (except for noted visual exam findings with Video Visits).  Please refer to the patient's chart (MyChart message for video visits and phone note for telephone visits) for the patient's consent to telehealth for [facility].  Date:  06/23/2018   ID:  Ricky [name]., DOB 03-04-1935, MRN [id number]  Patient Location:  Home  Provider location:   [place]  PCP:  Ricky Bowen, MD  Cardiologist:  Ricky Mocha, MD  Sleep Medicine:  Ricky Him, MD Electrophysiologist:  None   Chief Complaint:  OSA  History of Present Illness:    Ricky Voller. is a 83 y.o. male who presents via audio/video conferencing for a telehealth visit today.    This is an 83 year old African-American male with a history of hypertension, hyperlipidemia and PVD.  He also has a history of CVA.  He was seen last by Ricky Dopp, PA and due to problems with snoring a sleep study was ordered. This showed moderate  obstructive sleep apnea with an AHI of 28.3/h with oxygen desaturations as low as 81%.  There was significant snoring.  He underwent CPAP titration to 15 cm H2O.  He is doing well with his CPAP device and thinks that he has gotten used to it.  He tolerates the mask and feels the pressure is adequate.  Since going on CPAP he feels rested in the am and has no significant daytime sleepiness.  He denies any significant mouth or nasal dryness or nasal congestion.  He does not think that he snores.    The patient does not have symptoms concerning for COVID-19 infection (fever, chills, cough, or new shortness of breath).    Prior CV studies:   The following studies were reviewed today:  PAP compliance donwnload  Past Medical History:  Diagnosis Date  . HLD (hyperlipidemia)   . HTN (hypertension)   . Memory difficulty   . Prostate cancer ([place])   . PVD (peripheral vascular disease) ([place])   . 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 BACK SURGERY  1994   cervical  . CHOLECYSTECTOMY       No outpatient medications have been marked as taking for the 06/24/18 encounter (Appointment) with Sueanne Margarita, MD.     Allergies:   Aspirin   Social History   Tobacco Use  . Smoking status: Former Smoker    Last attempt to quit: 02/03/1992    Years since quitting: 26.4  .  Smokeless tobacco: Never Used  Substance Use Topics  . Alcohol use: No  . Drug use: No     Family Hx: The patient's family history includes Coronary artery disease in his unknown relative; Heart attack in his brother, father, and mother; Heart disease in his brother, father, and mother.  ROS:   Please see the history of present illness.     All other systems reviewed and are negative.   Labs/Other Tests and Data Reviewed:    Recent Labs: No results found for requested labs within last 8760 hours.   Recent Lipid Panel No results found for: CHOL, TRIG,  HDL, CHOLHDL, LDLCALC, LDLDIRECT  Wt Readings from Last 3 Encounters:  03/22/18 197 lb (89.4 kg)  01/04/18 194 lb 12.8 oz (88.4 kg)  10/28/17 194 lb 8 oz (88.2 kg)     Objective:    Vital Signs:  There were no vitals taken for this visit.   CONSTITUTIONAL:  Well nourished, well developed male in no acute distress.  EYES: anicteric MOUTH: oral mucosa is pink RESPIRATORY: Normal respiratory effort, symmetric expansion CARDIOVASCULAR: No peripheral edema SKIN: No rash, lesions or ulcers MUSCULOSKELETAL: no digital cyanosis NEURO: Cranial Nerves II-XII grossly intact, moves all extremities PSYCH: Intact judgement and insight.  A&O x 3, Mood/affect appropriate   ASSESSMENT & PLAN:    1.  OSA - the patient is tolerating PAP therapy well without any problems. The PAP download was reviewed today and showed an AHI of 3.3/hr on 15 cm H2O with 50% compliance in using more than 4 hours nightly.  The patient has been using and benefiting from PAP use and will continue to benefit from therapy.   2  COVID-19 Education:the signs and symptoms of COVID-19 were discussed with the patient and how to seek care for testing (follow up with PCP or arrange E-visit).  The importance of social distancing was discussed today.  Patient Risk:   After full review of this patient's clinical status, I feel that they are at least moderate risk at this time.  Time:   Today, I have spent *** minutes directly with the patient on video discussing medical problems including OSA.  We also reviewed the symptoms of COVID 19 and the ways to protect against contracting the virus with telehealth technology.  I spent an additional 5 minutes reviewing patient's chart including PAP compliance download.  Medication Adjustments/Labs and Tests Ordered: Current medicines are reviewed at length with the patient today.  Concerns regarding medicines are outlined above.  Tests Ordered: No orders of the defined types were placed in  this encounter.  Medication Changes: No orders of the defined types were placed in this encounter.   Disposition:  Follow up in 1 year(s)  Signed, Ricky Him, MD  06/23/2018 8:42 PM    [place]

## 2018-06-24 ENCOUNTER — Other Ambulatory Visit: Payer: Self-pay

## 2018-06-24 ENCOUNTER — Telehealth: Payer: Medicare Other | Admitting: Cardiology

## 2018-07-25 ENCOUNTER — Telehealth: Payer: Self-pay | Admitting: Neurology

## 2018-07-25 NOTE — Telephone Encounter (Signed)
Due to current COVID 19 pandemic, our office is severely reducing in office visits until further notice, in order to minimize the risk to our patients and healthcare providers.   Called patient to discuss a virtual visit or in office visit for his 6/23 appt. LVM requesting pt call back. Office contact info provided.

## 2018-07-26 ENCOUNTER — Ambulatory Visit: Payer: Medicare Other | Admitting: Neurology

## 2018-07-26 ENCOUNTER — Telehealth: Payer: Self-pay | Admitting: Neurology

## 2018-07-26 NOTE — Telephone Encounter (Signed)
This patient did not show for a revisit appointment today. 

## 2018-07-26 NOTE — Telephone Encounter (Signed)
Left vm for pt to arrive at 2 pm for his 230 appt scheduled for today.

## 2018-11-22 ENCOUNTER — Encounter: Payer: Self-pay | Admitting: Neurology

## 2018-11-22 ENCOUNTER — Telehealth: Payer: Self-pay | Admitting: Neurology

## 2018-11-22 NOTE — Telephone Encounter (Addendum)
lvm for pt to call back and discuss r/s appt for 10/30- c/a letter sent

## 2018-12-02 ENCOUNTER — Ambulatory Visit: Payer: Medicare Other | Admitting: Neurology
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
## 2018-12-22 ENCOUNTER — Other Ambulatory Visit: Payer: Self-pay

## 2018-12-22 ENCOUNTER — Encounter: Payer: Self-pay | Admitting: Neurology

## 2018-12-22 ENCOUNTER — Ambulatory Visit (INDEPENDENT_AMBULATORY_CARE_PROVIDER_SITE_OTHER): Payer: Medicare Other | Admitting: Neurology

## 2018-12-22 DIAGNOSIS — R42 Dizziness and giddiness: Secondary | ICD-10-CM | POA: Insufficient documentation

## 2018-12-22 DIAGNOSIS — I679 Cerebrovascular disease, unspecified: Secondary | ICD-10-CM

## 2018-12-22 DIAGNOSIS — H814 Vertigo of central origin: Secondary | ICD-10-CM | POA: Diagnosis not present

## 2018-12-22 NOTE — Progress Notes (Signed)
I have read the note, and I agree with the clinical assessment and plan.  Ricky [name]   

## 2018-12-22 NOTE — Patient Instructions (Signed)
I will reorder MRI of the brain and MRA Head. Please further events of loss of consciousness. Continue follow-up with your primary doctor and cardiology soon.

## 2018-12-25 ENCOUNTER — Telehealth: Payer: Self-pay

## 2018-12-25 NOTE — Telephone Encounter (Signed)
Order sent to GI to schedule. Patient can call them at [phone] for appointment status.  
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
## 2019-02-22 ENCOUNTER — Ambulatory Visit: Payer: Medicare Other | Admitting: Physician Assistant

## 2019-04-03 NOTE — Progress Notes (Signed)
Cardiology Office Note:    Date:  04/04/2019   ID:  Ricky [name]., DOB 1935-03-10, MRN [id number]  PCP:  Ricky Bowen, MD  Cardiologist:  Sherren Mocha, MD  Electrophysiologist:  None   Referring MD: Ricky Bowen, MD   Chief Complaint:  Follow-up (PAD, presumed CAD, HTN)    Patient Profile:    Ricky [name]. is a 84 y.o. male with:   Presumed coronary artery disease  Myoview in 2016: Low risk, inf-lat infarct w/ mild peri-infarct ischemia >> Med Rx   Intolerant of aspirin due to epistaxis  History of syncope 8/19  Atenolol DC'd secondary to bradycardia  Event monitor negative for arrhythmia  Echo 8/19: Normal EF (60-65)  Peripheral vascular disease  Chronic L SFA occlusion (managed conservatively)  Diabetes mellitus  Hyperlipidemia  Hypertension  History of stroke  Sleep apnea  Central vertigo  Prior CV studies:  Event Monitor 09/30/17 1. The basic rhythm is normal sinus 2. There are short runs of nonsustained VT 4-5 beats 3. There are few episodes of pSVT, non-sustained 4. No sustained arrhythmia, atrial fibrillation, or pathologic pauses > 3 seconds  Echo 09/30/17 Mod conc LVH, EF 60-65, no RWMA, normal diastolic function  Carotid US 05/06/17 Bilateral ICA 1-39  Nuclear stress test 08/14/14 Small area of inferolateral wall infarct at mid and basal level with mild peri infarct ischemia EF 59%  Low Risk  Echo 08/14/14 Normal LV function; grade 1 diastolic dysfunction; mild TR. PASP 32   History of Present Illness:    Mr. Ricky [name] was last seen in December 2019.    He returns for follow up. He is here alone.  He continues to have issues with dizziness.  He is followed by Neurology for vertigo.  He has not had any further syncope.  He has not had chest pain, shortness of breath, orthopnea, leg swelling.  He has not had claudication.  He does have some palpitations.  He describes a sensation of a skipped beat.  He has not had any  prolonged/sustained rapid palpitations.     Past Medical History:  Diagnosis Date  . HLD (hyperlipidemia)   . HTN (hypertension)   . Memory difficulty   . OSA on CPAP 06/23/2018   Moderate obstructive sleep apnea with an AHI of 28.3/h with oxygen desaturations as low as 81%.  Now on CPAP at 15 cm H2O.  . Prostate cancer ([place])   . PVD (peripheral vascular disease) ([place])   . Stroke (cerebrum) ([facility])    Right MCA distribution    Current Medications: Current Meds  Medication Sig  . cilostazol (PLETAL) 100 MG tablet Take 100 mg by mouth 2 (two) times daily.  . montelukast (SINGULAIR) 10 MG tablet   . naproxen (NAPROSYN) 375 MG tablet Take 375 mg by mouth 2 (two) times daily as needed (pain).  Glory Rosebush VERIO test strip USE ONCE A DAY TO CHECK BLOOD SUGARS.  Marland Kitchen simvastatin (ZOCOR) 40 MG tablet Take 40 mg by mouth at bedtime.      Allergies:   Aspirin   Social History   Tobacco Use  . Smoking status: Former Smoker    Quit date: 02/03/1992    Years since quitting: 27.1  . Smokeless tobacco: Never Used  Substance Use Topics  . Alcohol use: No  . Drug use: No     Family Hx: The patient's family history includes Coronary artery disease in his unknown relative; Heart attack in his brother, father, and mother; Heart disease in  his brother, father, and mother.  ROS   EKGs/Labs/Other Test Reviewed:    EKG:  EKG is  ordered today.  The ekg ordered today demonstrates normal sinus rhythm, heart rate 72, left axis deviation, inf and septal Q waves, no ST-T wave changes, QTC 396, no change when compared to old tracings  Recent Labs: No results found for requested labs within last 8760 hours.   Recent Lipid Panel No results found for: CHOL, TRIG, HDL, CHOLHDL, LDLCALC, LDLDIRECT  Physical Exam:    VS:  BP 140/60   Pulse 72   Ht 6' (1.829 m)   Wt 194 lb 12.8 oz (88.4 kg)   SpO2 96%   BMI 26.42 kg/m     Wt Readings from Last 3 Encounters:  04/04/19 194 lb 12.8 oz (88.4 kg)    12/22/18 189 lb 12.8 oz (86.1 kg)  03/22/18 197 lb (89.4 kg)     Constitutional:      Appearance: Healthy appearance. Not in distress.  Neck:     Thyroid: Thyroid normal.     Vascular: JVD normal.  Pulmonary:     Effort: Pulmonary effort is normal.     Breath sounds: No wheezing. No rales.  Cardiovascular:     Normal rate. Regular rhythm. Normal S1. Normal S2.     Murmurs: There is no murmur.  Edema:    Peripheral edema absent.  Abdominal:     Palpations: Abdomen is soft. There is no hepatomegaly.  Skin:    General: Skin is warm and dry.  Neurological:     General: No focal deficit present.     Mental Status: Alert and oriented to person, place and time.       ASSESSMENT & PLAN:    1. Coronary artery disease involving native coronary artery of native heart without angina pectoris Presumed coronary artery disease based upon an abnormal Myoview in 2016 which demonstrated an inf-lat scar with mild peri-infarct ischemia. He has been managed medically.  He is intol of ASA due to epistaxis.  He was taken off of beta-blocker Rx due to prior syncope and bradycardia.  He remains on high intensity statin Rx.  He is currently doing well without anginal symptoms.  Continue current Rx.   2. PAD (peripheral artery disease) ([place]) He has a known L SFA occlusion.  He is currently managed with Cilostazol.  He is not currently having any claudication symptoms and denies wounds/ulcers on his feet.    3. Essential hypertension BP is above goal.  I have asked him to monitor his BP for 1 week and send me those readings.  If BP is > 130/80, I will start him on Losartan.    4. Dizziness He has vertigo and is followed by Neurology.    5. OSA (obstructive sleep apnea) He could not tolerate a full facemask CPAP.  I will ask Dr. Theodosia Blender CMA to contact the patient to see what alternatives are available.       Dispo:  Return in about 1 year (around 04/03/2020) for Routine Follow Up, w/ Dr. Burt Knack, or  Richardson Dopp, PA-C, in person.   Medication Adjustments/Labs and Tests Ordered: Current medicines are reviewed at length with the patient today.  Concerns regarding medicines are outlined above.  Tests Ordered: Orders Placed This Encounter  Procedures  . EKG 12-Lead   Medication Changes: No orders of the defined types were placed in this encounter.   Signed, Richardson Dopp, PA-C  04/04/2019 12:50 PM  [facility] [address], [place], [place]  [address] Phone: [phone]; Fax: [phone]

## 2019-04-04 ENCOUNTER — Encounter: Payer: Self-pay | Admitting: Physician Assistant

## 2019-04-04 ENCOUNTER — Other Ambulatory Visit: Payer: Self-pay

## 2019-04-04 ENCOUNTER — Ambulatory Visit (INDEPENDENT_AMBULATORY_CARE_PROVIDER_SITE_OTHER): Payer: Medicare Other | Admitting: Physician Assistant

## 2019-04-04 VITALS — BP 140/60 | HR 72 | Ht 72.0 in | Wt 194.8 lb

## 2019-04-04 DIAGNOSIS — I2510 Atherosclerotic heart disease of native coronary artery without angina pectoris: Secondary | ICD-10-CM

## 2019-04-04 DIAGNOSIS — I739 Peripheral vascular disease, unspecified: Secondary | ICD-10-CM | POA: Diagnosis not present

## 2019-04-04 DIAGNOSIS — G4733 Obstructive sleep apnea (adult) (pediatric): Secondary | ICD-10-CM

## 2019-04-04 DIAGNOSIS — I10 Essential (primary) hypertension: Secondary | ICD-10-CM

## 2019-04-04 DIAGNOSIS — R42 Dizziness and giddiness: Secondary | ICD-10-CM | POA: Diagnosis not present

## 2019-04-04 NOTE — Patient Instructions (Signed)
Medication Instructions:  Your physician recommends that you continue on your current medications as directed. Please refer to the Current Medication list given to you today.  *If you need a refill on your cardiac medications before your next appointment, please call your pharmacy*   Lab Work:  None ordered today  If you have labs (blood work) drawn today and your tests are completely normal, you will receive your results only by: Marland Kitchen MyChart Message (if you have MyChart) OR . A paper copy in the mail If you have any lab test that is abnormal or we need to change your treatment, we will call you to review the results.   Testing/Procedures:  None ordered today  Follow-Up: At [facility], you and your health needs are our priority.  As part of our continuing mission to provide you with exceptional heart care, we have created designated Provider Care Teams.  These Care Teams include your primary Cardiologist (physician) and Advanced Practice Providers (APPs -  Physician Assistants and Nurse Practitioners) who all work together to provide you with the care you need, when you need it.  We recommend signing up for the patient portal called "MyChart".  Sign up information is provided on this After Visit Summary.  MyChart is used to connect with patients for Virtual Visits (Telemedicine).  Patients are able to view lab/test results, encounter notes, upcoming appointments, etc.  Non-urgent messages can be sent to your provider as well.   To learn more about what you can do with MyChart, go to [email or web address].    Your next appointment:   12 month(s)  The format for your next appointment:   In Person  Provider:   You may see Sherren Mocha, MD or one of the following Advanced Practice Providers on your designated Care Team:    Richardson Dopp, PA-C  [place], [place]  Daune Perch, NP    Other Instructions  Check your blood pressure once a day and send to [employer], PA-C  in one week.

## 2019-04-05 ENCOUNTER — Encounter: Payer: Self-pay | Admitting: *Deleted

## 2019-04-06 ENCOUNTER — Telehealth: Payer: Self-pay | Admitting: Physician Assistant

## 2019-04-06 NOTE — Telephone Encounter (Signed)
Ok. BP looks good.  Keep monitoring and let me know if > 130/80.  Richardson Dopp, PA-C 04/06/2019 16:51

## 2019-04-06 NOTE — Telephone Encounter (Signed)
New Message  Patient called to give [facility] is updated blood pressure readings. They were 121/60; 74 and 117/61; 74

## 2019-04-07 NOTE — Telephone Encounter (Signed)
I tried to call patient, the phone did not ring and voicemail is full.

## 2019-04-10 NOTE — Telephone Encounter (Signed)
I tried to call patient, the phone did not ring and voicemail is full.

## 2019-04-11 ENCOUNTER — Telehealth: Payer: Self-pay | Admitting: *Deleted

## 2019-04-11 NOTE — Telephone Encounter (Signed)
I have reached out to the patient via telephone and a no contact letter has been sent to the patient but I have not been able to contact him and he has not responded back to me in any way.

## 2019-04-11 NOTE — Telephone Encounter (Signed)
-----   Message from Sueanne Margarita, MD sent at 04/04/2019  6:26 PM EST ----- Ricky [name] He has never followed up after getting on PAP a year ago - please set up a virtual visit with me tomorrow at [place] ----- Message ----- From: Sharmon Revere Sent: 04/04/2019  12:50 PM EST To: Sueanne Margarita, MD, Freada Bergeron, CMA  Ricky [name] He could not tol a full facemask for his CPAP. Can you touch base with him to see if there are any alternatives? Thanks [employer]

## 2019-04-12 ENCOUNTER — Telehealth: Payer: Self-pay | Admitting: Cardiovascular Disease

## 2019-04-12 ENCOUNTER — Encounter: Payer: Self-pay | Admitting: *Deleted

## 2019-04-12 NOTE — Telephone Encounter (Signed)
Pt c/o BP issue: STAT if pt c/o blurred vision, one-sided weakness or slurred speech  1. What are your last 5 BP readings?  03/09 106/59 HR 69 03/10 118/67 HR 64  2. Are you having any other symptoms (ex. Dizziness, headache, blurred vision, passed out)? No   3. What is your BP issue? Ricky [name] is calling to report BP reading per [facility] request. He states he lost the readings from last week, but is starting over this week.  Please advise.

## 2019-04-12 NOTE — Telephone Encounter (Addendum)
Patient called in this to inform me that he returned his cpap machine last year but he is willing to start over again with a new sleep study. He felt like he was smothering with the mask he had and after he felt like he could not get a new mask he quit using his unit and in return he was deemed non-compliant and had to turn his unit in to his dme. He states he is willing to start over with a new sleep study and office visit to explain his non-compliance if he can get the right mask next time.

## 2019-04-12 NOTE — Telephone Encounter (Signed)
Virtual office visit made for 05/19/19.

## 2019-04-12 NOTE — Telephone Encounter (Signed)
I tried to call patient, phone did not ring and voicemail is full, I am unable to leave message for call back.

## 2019-04-12 NOTE — Telephone Encounter (Signed)
I'm fine with that 

## 2019-04-18 NOTE — Telephone Encounter (Signed)
I have tried to call patient back, the phone does not ring and voicemail is full. I have tried all numbers listed in patients chart and they have been disconnected.

## 2019-04-19 NOTE — Telephone Encounter (Signed)
I tried to call patient, voice full and the numbers listed in patients chart have been disconnected

## 2019-04-21 ENCOUNTER — Telehealth: Payer: Self-pay | Admitting: Physician Assistant

## 2019-04-21 NOTE — Telephone Encounter (Signed)
Unable to reach letter printed and mailed.

## 2019-04-21 NOTE — Telephone Encounter (Signed)
Reviewed blood pressure readings recently provided by the patient.  Blood pressures appear optimal.  Continue current medications. Richardson Dopp, PA-C 04/21/2019 1:55 PM

## 2019-04-21 NOTE — Telephone Encounter (Signed)
I have been trying to reach patient for about 10 days in regards to other messages about blood pressure. I have tried three times, phone does not ring, voicemail is full, and all numbers listed in chart have been disconnected. I sent patient an unable to reach letter today.

## 2019-04-24 NOTE — Telephone Encounter (Signed)
Ok. Richardson Dopp, PA-C    04/24/2019 8:02 AM

## 2019-04-26 ENCOUNTER — Encounter: Payer: Self-pay | Admitting: *Deleted

## 2019-04-26 NOTE — Progress Notes (Signed)
Letter sent to have [facility] call [facility] before his appointment.

## 2019-04-26 NOTE — Telephone Encounter (Signed)
Patient returned my call, he is aware that per [place] blood pressures are optimal and to continue current medications. Patient will call if blood pressures go over 130/80.

## 2019-04-26 NOTE — Telephone Encounter (Signed)
Follow Up  Patient returning call. Transferred call to Emily.  

## 2019-05-18 NOTE — Progress Notes (Signed)
Virtual Visit via Telephone Note   This visit type was conducted due to national recommendations for restrictions regarding the COVID-19 Pandemic (e.g. social distancing) in an effort to limit this patient's exposure and mitigate transmission in our community.  Due to his co-morbid illnesses, this patient is at least at moderate risk for complications without adequate follow up.  This format is felt to be most appropriate for this patient at this time.  All issues noted in this document were discussed and addressed.  A limited physical exam was performed with this format.  Please refer to the patient's chart for his consent to telehealth for [facility].  Evaluation Performed:  Follow-up visit  This visit type was conducted due to national recommendations for restrictions regarding the COVID-19 Pandemic (e.g. social distancing).  This format is felt to be most appropriate for this patient at this time.  All issues noted in this document were discussed and addressed.  No physical exam was performed (except for noted visual exam findings with Video Visits).  Please refer to the patient's chart (MyChart message for video visits and phone note for telephone visits) for the patient's consent to telehealth for [facility].  Date:  05/19/2019   ID:  Ricky [name]., DOB 12/20/1935, MRN [id number]  Patient Location:  Home  Provider location:   [facility]  PCP:  No primary care provider on file.  Cardiologist:  Sherren Mocha, MD  Sleep Medicine;  Fransico Him, MD Electrophysiologist:  None   Chief Complaint:  OSA  History of Present Illness:    Ricky [name]. is a 84 y.o. male who presents via audio/video conferencing for a telehealth visit today.    This is an 84yo male with a hx of PVD, DM, HTN, HLD, CVA and bradycardia.  He had a syncopal episode with no arrhythmia on event monitor. Due to hx of snoring and concern for possibly falling asleep while driving instead of syncope, a home  sleep study was ordered.  He tells me that he feels tired when he gets up in the am and gets tired during the day as well.  His sleep  showed moderate OSA with an AHi of 28.3/hr and nocturnal hypoxemia with lowest O2 81%.  He underwent CPAP titration to 15cm H2O.    He initially was doing well with his CPAP device but He says that he  stopped working and he took it back to the DME.  They still have the device and did not give him a loaner.  I will call the DME to determine what is going on.  He  feels the pressure is adequate but does not like the full face mask as he feels he is suffocating with the mask on and wants to try a nasal pillow mask. Marland Kitchen He denies any significant mouth or nasal dryness or nasal congestion.  He does not think that he snores.    The patient does not have symptoms concerning for COVID-19 infection (fever, chills, cough, or new shortness of breath).   Prior CV studies:   The following studies were reviewed today:  Sleep study, CPAP titration and PAP compliance download  Past Medical History:  Diagnosis Date  . HLD (hyperlipidemia)   . HTN (hypertension)   . Memory difficulty   . OSA on CPAP 06/23/2018   Moderate obstructive sleep apnea with an AHI of 28.3/h with oxygen desaturations as low as 81%.  Now on CPAP at 15 cm H2O.  . Prostate cancer ([place])   .  PVD (peripheral vascular disease) ([place])   . 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 BACK SURGERY  1994   cervical  . CHOLECYSTECTOMY       Current Meds  Medication Sig  . cilostazol (PLETAL) 100 MG tablet Take 100 mg by mouth 2 (two) times daily.  . metFORMIN (GLUCOPHAGE) 500 MG tablet Take by mouth 2 (two) times daily with a meal.  . montelukast (SINGULAIR) 10 MG tablet   . naproxen (NAPROSYN) 375 MG tablet Take 375 mg by mouth 2 (two) times daily as needed (pain).  Ricky [name] USE ONCE A DAY TO CHECK BLOOD SUGARS.  Marland Kitchen simvastatin  (ZOCOR) 40 MG tablet Take 40 mg by mouth at bedtime.      Allergies:   Aspirin   Social History   Tobacco Use  . Smoking status: Former Smoker    Quit date: 02/03/1992    Years since quitting: 27.3  . Smokeless tobacco: Never Used  Substance Use Topics  . Alcohol use: No  . Drug use: No     Family Hx: The patient's family history includes Coronary artery disease in his unknown relative; Heart attack in his brother, father, and mother; Heart disease in his brother, father, and mother.  ROS:   Please see the history of present illness.     All other systems reviewed and are negative.   Labs/Other Tests and Data Reviewed:    Recent Labs: No results found for requested labs within last 8760 hours.   Recent Lipid Panel No results found for: CHOL, TRIG, HDL, CHOLHDL, LDLCALC, LDLDIRECT  Wt Readings from Last 3 Encounters:  05/19/19 192 lb (87.1 kg)  04/04/19 194 lb 12.8 oz (88.4 kg)  12/22/18 189 lb 12.8 oz (86.1 kg)     Objective:    Vital Signs:  BP (!) 185/82   Pulse 69   Ht 6' (1.829 m)   Wt 192 lb (87.1 kg)   BMI 26.04 kg/m     ASSESSMENT & PLAN:    1.  OSA - The pathophysiology of obstructive sleep apnea , it's cardiovascular consequences & modes of treatment including CPAP were discused with the patient in detail & they evidenced understanding.  The patient is tolerating PAP therapy well without any problems. The PAP download was reviewed today and showed an AHI of 0.4/hr on 15 cm H2O with 0% compliance in using more than 4 hours nightly. He says that he was doing well initially but it stopped working and he took it back to the DME.  They still have the device and did not give him a loaner.  I will call the DME to determine what is going on.  I encouraged him to be more compliant with his device once he gets it back. The patient has been using and benefiting from PAP use and will continue to benefit from therapy. He would like to try a nasal pillow mask because he  does not like the full face mask.  He will need a chin strap as well and I will get a download in 4 weeks.   2.  HTN -BP poorly controlled -encouraged him to call his PCP today to let them know what his BP is running.   3.  Obesity -I have encouraged him to get into a routine exercise program and cut back on carbs and portions.     COVID-19 Education: The signs  and symptoms of COVID-19 were discussed with the patient and how to seek care for testing (follow up with PCP or arrange E-visit).  The importance of social distancing was discussed today.  Patient Risk:   After full review of this patient's clinical status, I feel that they are at least moderate risk at this time.  Time:   Today, I have spent 20 minutes on telemedicine discussing medical problems including OSA, HTN, Obesity and reviewing patient's chart including sleep study, CPAP titration and PAP compliance download.  Medication Adjustments/Labs and Tests Ordered: Current medicines are reviewed at length with the patient today.  Concerns regarding medicines are outlined above.  Tests Ordered: No orders of the defined types were placed in this encounter.  Medication Changes: No orders of the defined types were placed in this encounter.   Disposition:  Follow up in 6 week(s)  Signed, Fransico Him, MD  05/19/2019 8:28 AM    [facility]

## 2019-05-19 ENCOUNTER — Telehealth: Payer: Self-pay | Admitting: *Deleted

## 2019-05-19 ENCOUNTER — Telehealth (INDEPENDENT_AMBULATORY_CARE_PROVIDER_SITE_OTHER): Payer: Medicare Other | Admitting: Cardiology

## 2019-05-19 ENCOUNTER — Encounter: Payer: Self-pay | Admitting: Cardiology

## 2019-05-19 ENCOUNTER — Telehealth: Payer: Medicare Other | Admitting: Cardiology

## 2019-05-19 ENCOUNTER — Other Ambulatory Visit: Payer: Self-pay

## 2019-05-19 VITALS — BP 185/82 | HR 69 | Ht 72.0 in | Wt 192.0 lb

## 2019-05-19 DIAGNOSIS — G4733 Obstructive sleep apnea (adult) (pediatric): Secondary | ICD-10-CM

## 2019-05-19 DIAGNOSIS — Z6826 Body mass index (BMI) 26.0-26.9, adult: Secondary | ICD-10-CM | POA: Diagnosis not present

## 2019-05-19 DIAGNOSIS — E669 Obesity, unspecified: Secondary | ICD-10-CM

## 2019-05-19 DIAGNOSIS — I10 Essential (primary) hypertension: Secondary | ICD-10-CM

## 2019-05-19 NOTE — Telephone Encounter (Signed)
Patient called in this to inform me that he returned his cpap machine last year but he is willing to start over again with a new sleep study. He felt like he was smothering with the mask he had and after he felt like he could not get a new mask he quit using his unit and in return he was deemed non-compliant and had to turn his unit in to his dme. He states he is willing to start over with a new sleep study and office visit to explain his non-compliance if he can get the right mask next time.  Patient does not have a cpap anymore.

## 2019-05-19 NOTE — Telephone Encounter (Signed)
-----   Message from Antonieta Iba, RN sent at 05/19/2019  9:01 AM EDT ----- From Dr. Radford Pax: Please send note to [facility] to set up appt with his DME - they apparently have his device right now because it was not working and he has not heard back and they did not give him a loaner device so he is way out of compliance.  He also wants to change to a ResMed Airfit P20 mask with chin strap.  Please have him followup with me in 6 weeks.  ALso please call patient and tell him to contact his PCP today to let them know what his BP has been running since it is high   I have scheduled him for 6 week FU and advised him to call his PCP.   Thanks! Carly

## 2019-05-23 NOTE — Telephone Encounter (Signed)
Split night study ordered 

## 2019-05-23 NOTE — Addendum Note (Signed)
Addended by: Freada Bergeron on: 05/23/2019 12:32 PM   Modules accepted: Orders

## 2019-06-26 ENCOUNTER — Ambulatory Visit: Payer: Medicare Other | Admitting: Neurology

## 2019-06-27 ENCOUNTER — Telehealth: Payer: Self-pay | Admitting: *Deleted

## 2019-06-27 NOTE — Telephone Encounter (Signed)
Patient is scheduled for lab study on 07/10/19. Pt is scheduled for COVID screening on 07/07/19 11:20 prior to ss.  Patient understands his sleep study will be done at [facility] sleep lab. Patient understands he will receive a sleep packet in a week or so. Patient understands to call if he does not receive the sleep packet in a timely manner. Patient agrees with treatment and thanked me for call.

## 2019-06-27 NOTE — Telephone Encounter (Signed)
-----   Message from Alta Corning sent at 06/23/2019 11:35 AM EDT ----- Regarding: RE: precert [id number]: No Auth Required...CAS  ----- Message ----- From: Freada Bergeron, CMA Sent: 05/23/2019  12:30 PM EDT To: Cv Div Sleep Studies Subject: precert                                        Split night

## 2019-07-05 ENCOUNTER — Telehealth (INDEPENDENT_AMBULATORY_CARE_PROVIDER_SITE_OTHER): Payer: Medicare Other | Admitting: Cardiology

## 2019-07-05 ENCOUNTER — Telehealth: Payer: Self-pay

## 2019-07-05 ENCOUNTER — Other Ambulatory Visit: Payer: Self-pay

## 2019-07-05 ENCOUNTER — Encounter: Payer: Self-pay | Admitting: Cardiology

## 2019-07-05 VITALS — BP 185/82 | HR 61 | Wt 192.0 lb

## 2019-07-05 DIAGNOSIS — G4733 Obstructive sleep apnea (adult) (pediatric): Secondary | ICD-10-CM

## 2019-07-05 DIAGNOSIS — I10 Essential (primary) hypertension: Secondary | ICD-10-CM

## 2019-07-05 NOTE — Telephone Encounter (Signed)
°  Patient Consent for Virtual Visit         Ricky [name]. has provided verbal consent on 07/05/2019 for a virtual visit (video or telephone).   CONSENT FOR VIRTUAL VISIT FOR:  Ricky [name].  By participating in this virtual visit I agree to the following:  I hereby voluntarily request, consent and authorize [facility] and its employed or contracted physicians, physician assistants, nurse practitioners or other licensed health care professionals (the Practitioner), to provide me with telemedicine health care services (the Services") as deemed necessary by the treating Practitioner. I acknowledge and consent to receive the Services by the Practitioner via telemedicine. I understand that the telemedicine visit will involve communicating with the Practitioner through live audiovisual communication technology and the disclosure of certain medical information by electronic transmission. I acknowledge that I have been given the opportunity to request an in-person assessment or other available alternative prior to the telemedicine visit and am voluntarily participating in the telemedicine visit.  I understand that I have the right to withhold or withdraw my consent to the use of telemedicine in the course of my care at any time, without affecting my right to future care or treatment, and that the Practitioner or I may terminate the telemedicine visit at any time. I understand that I have the right to inspect all information obtained and/or recorded in the course of the telemedicine visit and may receive copies of available information for a reasonable fee.  I understand that some of the potential risks of receiving the Services via telemedicine include:   Delay or interruption in medical evaluation due to technological equipment failure or disruption;  Information transmitted may not be sufficient (e.g. poor resolution of images) to allow for appropriate medical decision making by the Practitioner;  and/or   In rare instances, security protocols could fail, causing a breach of personal health information.  Furthermore, I acknowledge that it is my responsibility to provide information about my medical history, conditions and care that is complete and accurate to the best of my ability. I acknowledge that Practitioner's advice, recommendations, and/or decision may be based on factors not within their control, such as incomplete or inaccurate data provided by me or distortions of diagnostic images or specimens that may result from electronic transmissions. I understand that the practice of medicine is not an exact science and that Practitioner makes no warranties or guarantees regarding treatment outcomes. I acknowledge that a copy of this consent can be made available to me via my patient portal ([facility]), or I can request a printed copy by calling the office of [facility].    I understand that my insurance will be billed for this visit.   I have read or had this consent read to me.  I understand the contents of this consent, which adequately explains the benefits and risks of the Services being provided via telemedicine.   I have been provided ample opportunity to ask questions regarding this consent and the Services and have had my questions answered to my satisfaction.  I give my informed consent for the services to be provided through the use of telemedicine in my medical care

## 2019-07-05 NOTE — Progress Notes (Signed)
Virtual Visit via Telephone Note   This visit type was conducted due to national recommendations for restrictions regarding the COVID-19 Pandemic (e.g. social distancing) in an effort to limit this patient's exposure and mitigate transmission in our community.  Due to his co-morbid illnesses, this patient is at least at moderate risk for complications without adequate follow up.  This format is felt to be most appropriate for this patient at this time.  All issues noted in this document were discussed and addressed.  A limited physical exam was performed with this format.  Please refer to the patient's chart for his consent to telehealth for [facility].  Evaluation Performed:  Follow-up visit  This visit type was conducted due to national recommendations for restrictions regarding the COVID-19 Pandemic (e.g. social distancing).  This format is felt to be most appropriate for this patient at this time.  All issues noted in this document were discussed and addressed.  No physical exam was performed (except for noted visual exam findings with Video Visits).  Please refer to the patient's chart (MyChart message for video visits and phone note for telephone visits) for the patient's consent to telehealth for [facility].  Date:  07/05/2019   ID:  Ricky Bowen., DOB 13-Feb-1935, MRN [id number]  Patient Location:  Home  Provider location:   [facility]  PCP:  Patient, No Pcp Per  Cardiologist:  Sherren Mocha, MD  Sleep Medicine;  Fransico Him, MD Electrophysiologist:  None   Chief Complaint:  OSA  History of Present Illness:    Ricky Creasman. is a 83 y.o. male who presents via audio/video conferencing for a telehealth visit today.    This is an 84yo male with a hx of PVD, DM, HTN, HLD, CVA and bradycardia.  He had a syncopal episode with no arrhythmia on event monitor. Due to hx of snoring and concern for possibly falling asleep while driving instead of syncope, a home sleep study was  ordered.  He tells me that he feels tired when he gets up in the am and gets tired during the day as well.  His sleep  showed moderate OSA with an AHi of 28.3/hr and nocturnal hypoxemia with lowest O2 81%.  He underwent CPAP titration to 15cm H2O.    He initially was doing well with his CPAP device but He says that he  stopped working and he took it back to the DME.  He told me at the last OV that the DME still had the device but apparently the DME kept the device due to noncompliance.   The patient does not have symptoms concerning for COVID-19 infection (fever, chills, cough, or new shortness of breath).   Prior CV studies:   The following studies were reviewed today:  Prior OV note  Past Medical History:  Diagnosis Date  . HLD (hyperlipidemia)   . HTN (hypertension)   . Memory difficulty   . OSA on CPAP 06/23/2018   Moderate obstructive sleep apnea with an AHI of 28.3/h with oxygen desaturations as low as 81%.  Now on CPAP at 15 cm H2O.  . Prostate cancer ([place])   . PVD (peripheral vascular disease) ([place])   . 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 BACK SURGERY  1994   cervical  . CHOLECYSTECTOMY       Current Meds  Medication Sig  . cilostazol (PLETAL) 100 MG tablet Take 100 mg  by mouth 2 (two) times daily.  . metFORMIN (GLUCOPHAGE) 500 MG tablet Take by mouth 2 (two) times daily with a meal.  . montelukast (SINGULAIR) 10 MG tablet   . naproxen (NAPROSYN) 375 MG tablet Take 375 mg by mouth 2 (two) times daily as needed (pain).  Glory Rosebush VERIO test strip USE ONCE A DAY TO CHECK BLOOD SUGARS.  Marland Kitchen simvastatin (ZOCOR) 40 MG tablet Take 40 mg by mouth at bedtime.      Allergies:   Aspirin   Social History   Tobacco Use  . Smoking status: Former Smoker    Quit date: 02/03/1992    Years since quitting: 27.4  . Smokeless tobacco: Never Used  Substance Use Topics  . Alcohol use: No  . Drug use: No      Family Hx: The patient's family history includes Coronary artery disease in his unknown relative; Heart attack in his brother, father, and mother; Heart disease in his brother, father, and mother.  ROS:   Please see the history of present illness.     All other systems reviewed and are negative.   Labs/Other Tests and Data Reviewed:    Recent Labs: No results found for requested labs within last 8760 hours.   Recent Lipid Panel No results found for: CHOL, TRIG, HDL, CHOLHDL, LDLCALC, LDLDIRECT  Wt Readings from Last 3 Encounters:  07/05/19 192 lb (87.1 kg)  05/19/19 192 lb (87.1 kg)  04/04/19 194 lb 12.8 oz (88.4 kg)     Objective:    Vital Signs:  BP (!) 185/82   Pulse 61   Wt 192 lb (87.1 kg)   BMI 26.04 kg/m     ASSESSMENT & PLAN:    1.  OSA  -he returned the machine to the DME to get fixed but he was not compliant with his device and so they kept the device and insurance is requiring him to go through another sleep study to get a new device -He did not like the full face mask because he felt he was smothering and would like to try the nasal pillow mask -he has a sleep study scheduled on 6/7  2.  HTN -BP poorly controlled -he is seeing Dr. Forde Dandy and I make an appt with him this week   Time:   Today, I have spent 15 minutes on telemedicine discussing medical problems including OSA, HTN, Obesity and reviewing patient's chart including sleep study, CPAP titration and PAP compliance download.  Medication Adjustments/Labs and Tests Ordered: Current medicines are reviewed at length with the patient today.  Concerns regarding medicines are outlined above.  Tests Ordered: No orders of the defined types were placed in this encounter.  Medication Changes: No orders of the defined types were placed in this encounter.   Disposition:  Follow up after sleep study  Signed, Fransico Him, MD  07/05/2019 8:14 AM    [facility]

## 2019-07-06 ENCOUNTER — Telehealth: Payer: Self-pay | Admitting: Cardiology

## 2019-07-06 NOTE — Telephone Encounter (Signed)
Tisha with Dr. Baldwin Crown office is returning Carly's call in regards to scheduling an appointment with Dr. Forde Dandy. She states she would like to make Carly aware that the patient was seen on 06/22/19. Please return call to discuss at [phone].

## 2019-07-07 ENCOUNTER — Other Ambulatory Visit (HOSPITAL_COMMUNITY)
Admission: RE | Admit: 2019-07-07 | Discharge: 2019-07-07 | Disposition: A | Discharge status: Home / Self Care | Payer: Medicare Other | Source: Ambulatory Visit | Attending: Cardiology | Admitting: Cardiology

## 2019-07-07 ENCOUNTER — Other Ambulatory Visit: Payer: Self-pay

## 2019-07-07 DIAGNOSIS — Z20822 Contact with and (suspected) exposure to covid-19: Secondary | ICD-10-CM | POA: Insufficient documentation

## 2019-07-07 DIAGNOSIS — Z01812 Encounter for preprocedural laboratory examination: Secondary | ICD-10-CM | POA: Diagnosis present

## 2019-07-07 NOTE — Telephone Encounter (Signed)
I spoke with Judie Petit and told her Dr Radford Pax is requesting follow up for patient regarding his hypertension. Fax number in this note confirmed and last office note faxed.

## 2019-07-07 NOTE — Telephone Encounter (Signed)
Tisha following up. She is unsure why the patient needs an appointment with Dr. Forde Dandy. She is also requesting to have the patients recent office notes faxed to their office at [phone]. She is aware that Bethann Berkshire is off today.

## 2019-07-08 LAB — SARS CORONAVIRUS 2 (TAT 6-24 HRS): SARS Coronavirus 2: NEGATIVE

## 2019-07-10 ENCOUNTER — Ambulatory Visit (HOSPITAL_BASED_OUTPATIENT_CLINIC_OR_DEPARTMENT_OTHER): Payer: Medicare Other | Attending: Cardiology | Admitting: Cardiology

## 2019-07-10 ENCOUNTER — Other Ambulatory Visit: Payer: Self-pay

## 2019-07-10 DIAGNOSIS — G4733 Obstructive sleep apnea (adult) (pediatric): Secondary | ICD-10-CM | POA: Diagnosis not present

## 2019-07-10 DIAGNOSIS — I441 Atrioventricular block, second degree: Secondary | ICD-10-CM | POA: Insufficient documentation

## 2019-07-10 DIAGNOSIS — E119 Type 2 diabetes mellitus without complications: Secondary | ICD-10-CM | POA: Diagnosis not present

## 2019-07-11 NOTE — Procedures (Signed)
    Patient Name: Ricky [name], Ricky [name] Date:07/10/2019 Gender: Male D.O.B: [birth year] Age (years): 56 Referring Provider: Fransico Him MD, ABSM Height (inches): 72 Interpreting Physician: Fransico Him MD, ABSM Weight (lbs): 192 RPSGT: Gwenyth Allegra BMI: 26 MRN: [id number] Neck Size: 16.00  CLINICAL INFORMATION Sleep Study Type: NPSG  Indication for sleep study: Diabetes, OSA  Epworth Sleepiness Score: 12  Most recent polysomnogram dated 01/19/2018 revealed an AHI of 28/h and RDI of 28/h. Most recent titration study dated 03/22/2018 was optimal at 15cm H2O with an AHI of 2/h.  SLEEP STUDY TECHNIQUE As per the AASM Manual for the Scoring of Sleep and Associated Events v2.3 (April 2016) with a hypopnea requiring 4% desaturations.  The channels recorded and monitored were frontal, central and occipital EEG, electrooculogram (EOG), submentalis EMG (chin), nasal and oral airflow, thoracic and abdominal wall motion, anterior tibialis EMG, snore microphone, electrocardiogram, and pulse oximetry.  MEDICATIONS Medications self-administered by patient taken the night of the study : N/A  SLEEP ARCHITECTURE The study was initiated at 10:38:56 PM and ended at 4:50:57 AM.  Sleep onset time was 69.4 minutes and the sleep efficiency was 36.8%. The total sleep time was 137 minutes.  Stage REM latency was 53.0 minutes.  The patient spent 18.2% of the night in stage N1 sleep, 74.5% in stage N2 sleep, 0.0% in stage N3 and 7.3% in REM.  Alpha intrusion was absent.  Supine sleep was 88.32%.  RESPIRATORY PARAMETERS The overall apnea/hypopnea index (AHI) was 7.4 per hour. There were 6 total apneas, including 2 obstructive, 4 central and 0 mixed apneas. There were 11 hypopneas and 15 RERAs.  The AHI during Stage REM sleep was 6.0 per hour.  AHI while supine was 6.0 per hour.  The mean oxygen saturation was 95.4%. The minimum SpO2 during sleep was 88.0%.  soft snoring was noted during this  study.  CARDIAC DATA The 2 lead EKG demonstrated sinus rhythm. The mean heart rate was 54.4 beats per minute. Other EKG findings include: second degree type 1 AV block with intermitent 2:1 AV block  LEG MOVEMENT DATA The total PLMS were 0 with a resulting PLMS index of 0.0. Associated arousal with leg movement index was 0.0 .  IMPRESSIONS - Mild obstructive sleep apnea occurred during this study (AHI = 7.4/h). - No significant central sleep apnea occurred during this study (CAI = 1.8/h). - The patient had minimal or no oxygen desaturation during the study (Min O2 = 88.0%) - The patient snored with soft snoring volume. - Second degree type 1 AV block with intermitent 2:1 AV block was noted for a large portion of the study. - Clinically significant periodic limb movements did not occur during sleep. No significant associated arousals.  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  RECOMMENDATIONS - Mild obstructive sleep apnea.  - Given heart block during the study, recommend CPAP titration.  - Avoid alcohol, sedatives and other CNS depressants that may worsen sleep apnea and disrupt normal sleep architecture. - Sleep hygiene should be reviewed to assess factors that may improve sleep quality. - Weight management and regular exercise should be initiated or continued if appropriate.  [Electronically signed] 07/11/2019 01:39 PM  Fransico Him MD, ABSM Diplomate, American Board of Sleep Medicine

## 2019-07-12 ENCOUNTER — Telehealth: Payer: Self-pay

## 2019-07-12 DIAGNOSIS — I4430 Unspecified atrioventricular block: Secondary | ICD-10-CM

## 2019-07-12 NOTE — Telephone Encounter (Signed)
Called patient to discuss event monitor.   Left message to call back.

## 2019-07-12 NOTE — Telephone Encounter (Signed)
-----   Message from Sherren Mocha, MD sent at 07/11/2019  3:33 PM EDT ----- Mont[facility] Circle, appreciate you reaching out about him ----- Message ----- From: Sueanne Margarita, MD Sent: 07/11/2019   1:44 PM EDT To: Sherren Mocha, MD  Ronalee Belts  Patient had significant type 1 first degree AV block for most of the sleep study while asleep with intermittent 2:1 block which persisted when he would get up to go to the bathroom.  LIkely related to OSA>  It is mild OSA but I am bringing him back to sleep lab for CPAP titration.  You make want to do an event monitor to assess if having any higher degree AVB during sleep or when awake - hopefull will improve on PAP therapy  Traci

## 2019-07-13 NOTE — Telephone Encounter (Signed)
Left message to call back  

## 2019-07-17 ENCOUNTER — Telehealth: Payer: Self-pay | Admitting: *Deleted

## 2019-07-17 NOTE — Telephone Encounter (Signed)
Informed patient of sleep study results and patient understanding was verbalized. Patient understands his sleep study showed they have sleep apnea and recommend CPAP titration ASAP due to heart block when sleeping. Please set up titration in the sleep lab.  Pt is aware and agreeable to his results.  cpap titration sent to sleep pool.

## 2019-07-17 NOTE — Telephone Encounter (Signed)
-----   Message from Sueanne Margarita, MD sent at 07/11/2019  1:42 PM EDT ----- Please let patient know that they have sleep apnea and recommend CPAP titration ASAP due to heart block when sleeping. Please set up titration in the sleep lab.

## 2019-07-19 ENCOUNTER — Telehealth: Payer: Self-pay

## 2019-07-19 ENCOUNTER — Telehealth: Payer: Self-pay | Admitting: *Deleted

## 2019-07-19 DIAGNOSIS — G4733 Obstructive sleep apnea (adult) (pediatric): Secondary | ICD-10-CM

## 2019-07-19 NOTE — Telephone Encounter (Signed)
The patient agrees to event monitor.  Address and insurance confirmed.  He understands he will receive monitor in the mail in a few days. He was grateful for assistance.

## 2019-07-19 NOTE — Telephone Encounter (Signed)
30 day Event monitor registered to be mailed to pt's home.

## 2019-07-19 NOTE — Telephone Encounter (Signed)
Staff message sent to Gae Bon ok to schedule sleep. Per [facility] web portal no PA is required. Decision [id number].

## 2019-07-19 NOTE — Telephone Encounter (Signed)
-----   Message from Lauralee Evener, [place] sent at 07/19/2019  4:51 PM EDT ----- Regarding: RE: precert Ok to schedule CPAP titration. Per [facility] web portal no PA is required. Decision [id number]. ----- Message ----- From: Freada Bergeron, CMA Sent: 07/17/2019   6:37 PM EDT To: [facility] Sleep Studies Subject: precert                                         CPAP titration ASAP due to heart block when sleeping.

## 2019-07-21 NOTE — Telephone Encounter (Signed)
Patient is scheduled for CPAP Titration on 08/05/19. pt is scheduled for COVID screening on 08/03/19 1:45 prior to titration.  Patient understands his titration study will be done at [facility] sleep lab. Patient understands he will receive a letter in a week or so detailing appointment, date, time, and location. Patient understands to call if he does not receive the letter  in a timely manner. Left detailed message on voicemail with date and time of titration and informed patient to call back to confirm or reschedule.

## 2019-08-02 ENCOUNTER — Ambulatory Visit (INDEPENDENT_AMBULATORY_CARE_PROVIDER_SITE_OTHER): Payer: Medicare Other

## 2019-08-02 DIAGNOSIS — I4430 Unspecified atrioventricular block: Secondary | ICD-10-CM

## 2019-08-03 ENCOUNTER — Other Ambulatory Visit (HOSPITAL_COMMUNITY): Payer: Medicare Other

## 2019-08-05 ENCOUNTER — Ambulatory Visit (HOSPITAL_BASED_OUTPATIENT_CLINIC_OR_DEPARTMENT_OTHER): Payer: Medicare Other | Attending: Cardiology | Admitting: Cardiology

## 2019-08-05 ENCOUNTER — Other Ambulatory Visit: Payer: Self-pay

## 2019-08-05 DIAGNOSIS — G4733 Obstructive sleep apnea (adult) (pediatric): Secondary | ICD-10-CM | POA: Insufficient documentation

## 2019-08-20 NOTE — Procedures (Signed)
    Patient Name: Ricky [name], Ricky [name] Date: 08/05/2019 Gender: Male D.O.B: 05-07-1935 Age (years): 59 Referring Provider: Fransico Him MD, ABSM Height (inches): 72 Interpreting Physician: Fransico Him MD, ABSM Weight (lbs): 192 RPSGT: Earney Hamburg BMI: 26 MRN: [id number] Neck Size: 16.00  CLINICAL INFORMATION The patient is referred for a CPAP titration to treat sleep apnea.  SLEEP STUDY TECHNIQUE As per the AASM Manual for the Scoring of Sleep and Associated Events v2.3 (April 2016) with a hypopnea requiring 4% desaturations.  The channels recorded and monitored were frontal, central and occipital EEG, electrooculogram (EOG), submentalis EMG (chin), nasal and oral airflow, thoracic and abdominal wall motion, anterior tibialis EMG, snore microphone, electrocardiogram, and pulse oximetry. Continuous positive airway pressure (CPAP) was initiated at the beginning of the study and titrated to treat sleep-disordered breathing.  MEDICATIONS Medications self-administered by patient taken the night of the study : N/A  TECHNICIAN COMMENTS Comments added by technician: Patient was restless all through the night. Comments added by scorer: N/A  RESPIRATORY PARAMETERS Optimal PAP Pressure (cm): 13  AHI at Optimal Pressure (/hr):0.0 Overall Minimal O2 (%):93.0 Supine % at Optimal Pressure (%):95 Minimal O2 at Optimal Pressure (%): 95.0   SLEEP ARCHITECTURE The study was initiated at 9:18:48 PM and ended at 4:29:57 AM.  Sleep onset time was 21.4 minutes and the sleep efficiency was 67.1%. The total sleep time was 289.3 minutes.  The patient spent 3.3% of the night in stage N1 sleep, 90.3% in stage N2 sleep, 0.0% in stage N3 and 6.4% in REM.Stage REM latency was 76.0 minutes  Wake after sleep onset was 120.5. Alpha intrusion was absent. Supine sleep was 88.07%.  CARDIAC DATA The 2 lead EKG demonstrated sinus rhythm. The mean heart rate was 53.3 beats per minute. Other EKG findings  include: PVCs.  LEG MOVEMENT DATA The total Periodic Limb Movements of Sleep (PLMS) were 0. The PLMS index was 0.0. A PLMS index of <15 is considered normal in adults.  IMPRESSIONS - The optimal PAP pressure was 13 cm of water. - Central sleep apnea was not noted during this titration (CAI = 0.6/h). - Significant oxygen desaturations were not observed during this titration (min O2 = 93.0%). - The patient snored with soft snoring volume during this titration study. - PVCs were observed during this study. - Clinically significant periodic limb movements were not noted during this study. Arousals associated with PLMs were rare.  DIAGNOSIS - Obstructive Sleep Apnea (G47.33)  RECOMMENDATIONS - Trial of CPAP therapy on 13 cm H2O with a Medium size Fisher&Paykel Full Face Mask Simplus mask and heated humidification. - Avoid alcohol, sedatives and other CNS depressants that may worsen sleep apnea and disrupt normal sleep architecture. - Sleep hygiene should be reviewed to assess factors that may improve sleep quality. - Weight management and regular exercise should be initiated or continued. - Return to Sleep Center for re-evaluation after 8 weeks of therapy  [Electronically signed] 08/20/2019 05:35 PM  Fransico Him MD, ABSM Diplomate, American Board of Sleep Medicine

## 2019-08-21 ENCOUNTER — Telehealth: Payer: Self-pay | Admitting: *Deleted

## 2019-08-21 NOTE — Telephone Encounter (Signed)
-----   Message from Sueanne Margarita, MD sent at 08/20/2019  5:38 PM EDT ----- Please let patient know that they had a successful PAP titration and let DME know that orders are in EPIC.  Please set up 8 week OV with me.

## 2019-08-21 NOTE — Telephone Encounter (Signed)
Called results lmtcb. 

## 2019-08-22 ENCOUNTER — Ambulatory Visit: Payer: Medicare Other | Admitting: Neurology

## 2019-09-06 NOTE — Telephone Encounter (Signed)
Informed patient of sleep study results and patient understanding was verbalized. Patient understands his titration study showed they had a successful PAP titration and let DME know that orders are in EPIC. Please set up 8 week OV with me.   Upon patient request DME selection is [facility]. Patient understands he will be contacted by [facility] to set up his cpap. Patient understands to call if [facility] does not contact him with new setup in a timely manner. Patient understands they will be called once confirmation has been received from [facility] that they have received their new machine to schedule 10 week follow up appointment.  [facility] notified of new cpap order  Please add to airview Patient was grateful for the call and thanked me.

## 2019-09-28 NOTE — Telephone Encounter (Signed)
Has he gotten his device

## 2019-09-28 NOTE — Telephone Encounter (Signed)
No, Ricky [name] says he came to the office to get it and he was very confused saying he was told his heart was just fine and that I did not call his sleep study result to him. He was convinced he didn't need it so they let him go without it. I called him but he was still very confused.

## 2019-09-28 NOTE — Telephone Encounter (Addendum)
[facility] Ivin Booty) called to say they tried to set patient up with his cpap on 09/27/19 but patient refused saying he was told he did not need the cpap. Ivin Booty reports that the patient was very confused and did not even remember taking the sleep study. I reached out to the patient and he sounded confused to me also. I encouraged patient to go get his unit and be compliant with it because he has some heart issues.

## 2019-10-04 ENCOUNTER — Telehealth: Payer: Self-pay | Admitting: *Deleted

## 2019-10-04 NOTE — Telephone Encounter (Signed)
[facility] and [facility] are going to [facility] his cpap and they will contact him once it comes in.

## 2019-10-04 NOTE — Telephone Encounter (Signed)
-----   Message from Sueanne Margarita, MD sent at 09/28/2019  7:22 PM EDT ----- Please call him back and tell him that I want him to get his CPAP and set him back up with an appt with DME.  If he refuses then please set up visit with me  Tressia Miners

## 2019-11-14 NOTE — Telephone Encounter (Signed)
Patient called today stating no one from [facility] ever called him concerning his cpap after he turned his unit back in. I called [facility] spoke to Tinnie Gens who says the patient turned his machine in and at this point he will have to start over with a new sleep study.

## 2019-12-11 NOTE — Telephone Encounter (Signed)
Patient called in to get an appointment with dr Radford Pax to be referred back for another sleep study to get another cpap after turning in his machine into [facility].

## 2020-01-08 ENCOUNTER — Telehealth: Payer: Medicare Other | Admitting: Cardiology

## 2020-01-08 ENCOUNTER — Other Ambulatory Visit: Payer: Self-pay

## 2020-01-18 ENCOUNTER — Other Ambulatory Visit: Payer: Self-pay

## 2020-01-18 ENCOUNTER — Encounter: Payer: Self-pay | Admitting: Cardiology

## 2020-01-18 ENCOUNTER — Ambulatory Visit: Payer: Medicare Other | Admitting: Cardiology

## 2020-01-18 VITALS — BP 136/78 | HR 68

## 2020-01-18 DIAGNOSIS — G4733 Obstructive sleep apnea (adult) (pediatric): Secondary | ICD-10-CM | POA: Diagnosis not present

## 2020-01-18 DIAGNOSIS — I10 Essential (primary) hypertension: Secondary | ICD-10-CM | POA: Diagnosis not present

## 2020-01-18 DIAGNOSIS — E669 Obesity, unspecified: Secondary | ICD-10-CM | POA: Diagnosis not present

## 2020-01-18 NOTE — Addendum Note (Signed)
Addended by: Antonieta Iba on: 01/18/2020 10:27 AM   Modules accepted: Orders

## 2020-01-18 NOTE — Patient Instructions (Signed)
Medication Instructions:  Your physician recommends that you continue on your current medications as directed. Please refer to the Current Medication list given to you today. *If you need a refill on your cardiac medications before your next appointment, please call your pharmacy*  Testing/Procedures: Your physician has recommended that you have a sleep study. This test records several body functions during sleep, including: brain activity, eye movement, oxygen and carbon dioxide blood levels, heart rate and rhythm, breathing rate and rhythm, the flow of air through your mouth and nose, snoring, body muscle movements, and chest and belly movement.  Follow-Up: At [facility], you and your health needs are our priority.  As part of our continuing mission to provide you with exceptional heart care, we have created designated Provider Care Teams.  These Care Teams include your primary Cardiologist (physician) and Advanced Practice Providers (APPs -  Physician Assistants and Nurse Practitioners) who all work together to provide you with the care you need, when you need it.  We recommend signing up for the patient portal called "MyChart".  Sign up information is provided on this After Visit Summary.  MyChart is used to connect with patients for Virtual Visits (Telemedicine).  Patients are able to view lab/test results, encounter notes, upcoming appointments, etc.  Non-urgent messages can be sent to your provider as well.   To learn more about what you can do with MyChart, go to [email or web address].    Follow up with Dr. Radford Pax after your sleep study

## 2020-01-18 NOTE — Progress Notes (Signed)
Date:  01/18/2020   ID:  Ricky [name]., DOB 02-19-1935, MRN [id number]   PCP:  Ricky Bowen, MD  Cardiologist:  Sherren Mocha, MD  Sleep Medicine;  Fransico Him, MD Electrophysiologist:  None   Chief Complaint:  OSA  History of Present Illness:    Ricky [name]. is a 84 y.o. male with a hx of PVD, DM, HTN, HLD, CVA and bradycardia.  He had a syncopal episode with no arrhythmia on event monitor. Due to hx of snoring and concern for possibly falling asleep while driving instead of syncope, a home sleep study was ordered.  He tells me that he feels tired when he gets up in the am and gets tired during the day as well.  His sleep  showed moderate OSA with an AHi of 28.3/hr and nocturnal hypoxemia with lowest O2 81%.  He underwent CPAP titration to 15cm H2O.    He initially was doing well with his CPAP device but his device stopped working and he took it back to the DME.  He never went and picked it back because he says that they never called him back.  No the DME requires another sleep study to restart CPAP.    The patient does not have symptoms concerning for COVID-19 infection (fever, chills, cough, or new shortness of breath).   Prior CV studies:   The following studies were reviewed today:  Sleep study, CPAP titration  Past Medical History:  Diagnosis Date   HLD (hyperlipidemia)    HTN (hypertension)    Memory difficulty    OSA on CPAP 06/23/2018   Moderate obstructive sleep apnea with an AHI of 28.3/h with oxygen desaturations as low as 81%.  Now on CPAP at 15 cm H2O.   Prostate cancer ([place])    PVD (peripheral vascular disease) ([place])    Stroke (cerebrum) (HCC)    Right MCA distribution   Past Surgical History:  Procedure Laterality Date   BACK SURGERY  1994   cervical   CHOLECYSTECTOMY       Current Meds  Medication Sig   cilostazol (PLETAL) 100 MG tablet Take 100 mg by mouth 2 (two) times daily.   metFORMIN (GLUCOPHAGE) 500 MG tablet Take by mouth 2  (two) times daily with a meal.   montelukast (SINGULAIR) 10 MG tablet    naproxen (NAPROSYN) 375 MG tablet Take 375 mg by mouth 2 (two) times daily as needed (pain).   ONETOUCH VERIO test strip USE ONCE A DAY TO CHECK BLOOD SUGARS.   simvastatin (ZOCOR) 40 MG tablet Take 40 mg by mouth at bedtime.     Allergies:   Aspirin   Social History   Tobacco Use   Smoking status: Former Smoker    Quit date: 02/03/1992    Years since quitting: 27.9   Smokeless tobacco: Never Used  Vaping Use   Vaping Use: Never used  Substance Use Topics   Alcohol use: No   Drug use: No     Family Hx: The patient's family history includes Coronary artery disease in his unknown relative; Heart attack in his brother, father, and mother; Heart disease in his brother, father, and mother.  ROS:   Please see the history of present illness.     All other systems reviewed and are negative.   Labs/Other Tests and Data Reviewed:    Recent Labs: No results found for requested labs within last 8760 hours.   Recent Lipid Panel No results found for: CHOL, TRIG, HDL,  CHOLHDL, LDLCALC, LDLDIRECT  Wt Readings from Last 3 Encounters:  08/05/19 194 lb (88 kg)  07/10/19 192 lb (87.1 kg)  07/05/19 192 lb (87.1 kg)     Objective:    Vital Signs:  BP 136/78    Pulse 68    SpO2 96%    GEN: Well nourished, well developed in no acute distress HEENT: Normal NECK: No JVD; No carotid bruits LYMPHATICS: No lymphadenopathy CARDIAC:RRR, no murmurs, rubs, gallops RESPIRATORY:  Clear to auscultation without rales, wheezing or rhonchi  ABDOMEN: Soft, non-tender, non-distended MUSCULOSKELETAL:  No edema; No deformity  SKIN: Warm and dry NEUROLOGIC:  Alert and oriented x 3 PSYCHIATRIC:  Normal affect   ASSESSMENT & PLAN:    1.  OSA  -he was noncompliant with his device because he said it was drowning him -he never went to pick his device back up at the DME after dropping it off to be checked and now is out  of compliance and needs another sleep study to get another PAp device -I will order a split night sleep study to get him back on PAP therapy  2.  HTN -BP well controlled today. -not on any antihypertensive meds  3.  Obesity -I have encouraged him to get into a routine exercise program and cut back on carbs and portions.    Medication Adjustments/Labs and Tests Ordered: Current medicines are reviewed at length with the patient today.  Concerns regarding medicines are outlined above.  Tests Ordered: No orders of the defined types were placed in this encounter.  Medication Changes: No orders of the defined types were placed in this encounter.   Disposition:  Follow up after sleep study  Signed, Fransico Him, MD  01/18/2020 10:17 AM    [place]

## 2020-01-19 ENCOUNTER — Telehealth: Payer: Self-pay | Admitting: *Deleted

## 2020-01-19 NOTE — Telephone Encounter (Signed)
-----   Message from Antonieta Iba, RN sent at 01/18/2020 10:32 AM EST ----- Split night sleep study has been ordered.  Thanks! Carly

## 2020-04-23 ENCOUNTER — Ambulatory Visit: Payer: Medicare Other | Admitting: Podiatry

## 2020-04-23 ENCOUNTER — Other Ambulatory Visit: Payer: Self-pay

## 2020-04-23 DIAGNOSIS — L600 Ingrowing nail: Secondary | ICD-10-CM | POA: Diagnosis not present

## 2020-04-23 DIAGNOSIS — M79676 Pain in unspecified toe(s): Secondary | ICD-10-CM

## 2020-04-23 DIAGNOSIS — M205X1 Other deformities of toe(s) (acquired), right foot: Secondary | ICD-10-CM | POA: Diagnosis not present

## 2020-04-23 NOTE — Progress Notes (Signed)
  Subjective:  Patient ID: Ricky [name]., male    DOB: Nov 29, 1935,  MRN: [id number]  Chief Complaint  Patient presents with  . Ingrown Toenail    Ingrown of right great toe x 1 year. Pt. States his nail is digging in his toe causing pain.    85 y.o. male presents with the above complaint. History confirmed with patient. States pain is worst at the end of the toe. Thinks they removed his nail of the great toe before but is not sure.  Objective:  Physical Exam: warm, good capillary refill, no trophic changes or ulcerative lesions, normal DP and PT pulses and normal sensory exam. Right Foot: POP distal tuft right hallux without HPK or skin breakdown. Evidence of prior hallux nail removal with hyperkeratosis of the nail bed.   Assessment:   1. Hallux extensus, acquired, right   2. Ingrown nail   3. Pain around toenail      Plan:  Patient was evaluated and treated and all questions answered.  Hallux Extensus with Distal Tuft pain -Educated on etiology -Residual nail debrided no ingrown noted -Silicone toe cap dispensed.  Return if symptoms worsen or fail to improve.

## 2020-06-13 ENCOUNTER — Telehealth: Payer: Self-pay | Admitting: Orthopaedic Surgery

## 2020-06-13 MED ORDER — NAPROXEN 375 MG PO TBEC: 1.0000 | DELAYED_RELEASE_TABLET | Freq: Two times a day (BID) | ORAL | 0 refills | Status: DC

## 2020-06-13 NOTE — Telephone Encounter (Signed)
Rx was sent to the pharmacy  

## 2020-06-13 NOTE — Telephone Encounter (Signed)
Ok thanks 

## 2020-06-13 NOTE — Telephone Encounter (Signed)
Pt called asking for a refill of his naroxen rx sent to [facility] on [address]

## 2020-06-13 NOTE — Telephone Encounter (Signed)
Please advise 

## 2020-06-26 ENCOUNTER — Encounter: Payer: Self-pay | Admitting: *Deleted

## 2020-06-26 NOTE — Telephone Encounter (Addendum)
Prior Authorization for split night study sent to [facility] via web portal. No PA required  Tracking Number [id number].  Patient scheduled for July 31

## 2020-06-27 ENCOUNTER — Encounter: Payer: Self-pay | Admitting: *Deleted

## 2020-06-27 NOTE — Telephone Encounter (Signed)
This encounter was created in error - please disregard.

## 2020-06-27 NOTE — Telephone Encounter (Signed)
Prior Authorization for split night study sent to [facility] via web portal. No PA required  Tracking Number [id number].  Patient scheduled for July 31

## 2020-06-27 NOTE — Telephone Encounter (Signed)
Patient is scheduled for lab study on 09-01-20. Patient understands her sleep study will be done at [facility] sleep lab. Patient understands she will receive a sleep packet in a week or so. Patient understands to call if she does not receive the sleep packet in a timely manner. Patient agrees with treatment and thanked me for call.

## 2020-09-01 ENCOUNTER — Ambulatory Visit (HOSPITAL_BASED_OUTPATIENT_CLINIC_OR_DEPARTMENT_OTHER): Payer: Medicare Other | Attending: Cardiology | Admitting: Cardiology

## 2020-09-01 ENCOUNTER — Other Ambulatory Visit: Payer: Self-pay

## 2020-09-01 DIAGNOSIS — R0902 Hypoxemia: Secondary | ICD-10-CM | POA: Insufficient documentation

## 2020-09-01 DIAGNOSIS — G4733 Obstructive sleep apnea (adult) (pediatric): Secondary | ICD-10-CM | POA: Insufficient documentation

## 2020-09-01 DIAGNOSIS — I441 Atrioventricular block, second degree: Secondary | ICD-10-CM | POA: Diagnosis not present

## 2020-09-05 NOTE — Procedures (Signed)
   Patient Name: Ricky [name], Ricky [name] Date:09/01/2020 Gender: Male D.O.B: Jan 14, 1936 Age (years): 33 Referring Provider: Fransico Him MD, ABSM Height (inches): 70 Interpreting Physician: Fransico Him MD, ABSM Weight (lbs): 192 RPSGT: Baxter Flattery BMI: 28 MRN: [id number] Neck Size: 16.00  CLINICAL INFORMATION Sleep Study Type: NPSG  Indication for sleep study: Fatigue, Snoring, Witnesses Apnea / Gasping During Sleep  Epworth Sleepiness Score: 9  SLEEP STUDY TECHNIQUE As per the AASM Manual for the Scoring of Sleep and Associated Events v2.3 (April 2016) with a hypopnea requiring 4% desaturations.  The channels recorded and monitored were frontal, central and occipital EEG, electrooculogram (EOG), submentalis EMG (chin), nasal and oral airflow, thoracic and abdominal wall motion, anterior tibialis EMG, snore microphone, electrocardiogram, and pulse oximetry.  MEDICATIONS Medications self-administered by patient taken the night of the study : N/A  SLEEP ARCHITECTURE The study was initiated at 10:01:46 PM and ended at 4:20:49 AM.  Sleep onset time was 53.8 minutes and the sleep efficiency was 35.1%. The total sleep time was 133 minutes.  Stage REM latency was 221.5 minutes.  The patient spent 8.3% of the night in stage N1 sleep, 75.6% in stage N2 sleep, 0.0% in stage N3 and 16.2% in REM.  Alpha intrusion was absent.  Supine sleep was 97.37%.  RESPIRATORY PARAMETERS The overall apnea/hypopnea index (AHI) was 23.9 per hour. There were 53 total apneas, including 46 obstructive, 5 central and 2 mixed apneas. There were 0 hypopneas and 24 RERAs.  The AHI during Stage REM sleep was 47.4 per hour.  AHI while supine was 23.2 per hour.  The mean oxygen saturation was 95.2%. The minimum SpO2 during sleep was 85.0%.  moderate snoring was noted during this study.  CARDIAC DATA The 2 lead EKG demonstrated sinus rhythm. The mean heart rate was 49.3 beats per minute. Other EKG  findings include: PACs, blocked PACs, possible 2:1 second degree AV block  LEG MOVEMENT DATA The total PLMS were 0 with a resulting PLMS index of 0.0. Associated arousal with leg movement index was 0.0 .  IMPRESSIONS - Moderate obstructive sleep apnea occurred during this study (AHI = 23.9/h). - No significant central sleep apnea occurred during this study (CAI = 2.3/h). - Mild oxygen desaturation was noted during this study (Min O2 = 85.0%). - The patient snored with moderate snoring volume. - EKG findings include PACs, blocked PACs, possible 2:1 second degree AV block. - Clinically significant periodic limb movements did not occur during sleep. No significant associated arousals.  DIAGNOSIS - Obstructive Sleep Apnea (G47.33) - Nocturnal Hypoxemia (G47.36) - Second Degree AV block - Blocked PACs  RECOMMENDATIONS - Therapeutic CPAP titration to determine optimal pressure required to alleviate sleep disordered breathing. - Avoid alcohol, sedatives and other CNS depressants that may worsen sleep apnea and disrupt normal sleep architecture. - Sleep hygiene should be reviewed to assess factors that may improve sleep quality. - Weight management and regular exercise should be initiated or continued if appropriate.  [Electronically signed] 09/05/2020 07:43 PM  Fransico Him MD, ABSM Diplomate, American Board of Sleep Medicine

## 2020-09-06 ENCOUNTER — Telehealth: Payer: Self-pay

## 2020-09-06 NOTE — Telephone Encounter (Signed)
Left message for patient to call back  

## 2020-09-06 NOTE — Telephone Encounter (Signed)
-----   Message from Sueanne Margarita, MD sent at 09/05/2020  7:48 PM EDT ----- Please get a 2 week ziopatch due to AV block on sleep study

## 2020-09-09 ENCOUNTER — Telehealth: Payer: Self-pay | Admitting: *Deleted

## 2020-09-09 DIAGNOSIS — G4733 Obstructive sleep apnea (adult) (pediatric): Secondary | ICD-10-CM

## 2020-09-09 NOTE — Telephone Encounter (Signed)
-----   Message from Sueanne Margarita, MD sent at 09/05/2020  7:45 PM EDT ----- Please let patient know that they have sleep apnea.  Recommend therapeutic CPAP titration for treatment of patient's sleep disordered breathing.  If unable to perform an in lab titration then initiate ResMed auto CPAP from 4 to 15cm H2O with heated humidity and mask of choice and overnight pulse ox on CPAP.

## 2020-09-09 NOTE — Telephone Encounter (Signed)
Patient understands his sleep study showed they have sleep apnea and recommend CPAP titration. Please set up titration in the sleep lab ASAP. Pt is aware of her/his results   Titration to be precerted

## 2020-09-16 NOTE — Telephone Encounter (Signed)
Prior Authorization for titration sent to [facility] via web portal.  Number Notification or Prior Authorization is not required for the requested services  Decision ID [id number].

## 2020-09-18 NOTE — Addendum Note (Signed)
Addended by: Freada Bergeron on: 09/18/2020 02:21 PM   Modules accepted: Orders

## 2020-10-04 ENCOUNTER — Emergency Department (HOSPITAL_COMMUNITY): Payer: Medicare Other

## 2020-10-04 ENCOUNTER — Inpatient Hospital Stay (HOSPITAL_COMMUNITY)
Admission: EM | Admit: 2020-10-04 | Discharge: 2020-10-12 | DRG: 871 | Disposition: A | Discharge status: Skilled Nursing Facility | Payer: Medicare Other | Attending: Family Medicine | Admitting: Family Medicine

## 2020-10-04 ENCOUNTER — Other Ambulatory Visit: Payer: Self-pay

## 2020-10-04 DIAGNOSIS — Z886 Allergy status to analgesic agent status: Secondary | ICD-10-CM

## 2020-10-04 DIAGNOSIS — E1151 Type 2 diabetes mellitus with diabetic peripheral angiopathy without gangrene: Secondary | ICD-10-CM | POA: Diagnosis present

## 2020-10-04 DIAGNOSIS — D509 Iron deficiency anemia, unspecified: Secondary | ICD-10-CM | POA: Diagnosis present

## 2020-10-04 DIAGNOSIS — R6520 Severe sepsis without septic shock: Secondary | ICD-10-CM | POA: Diagnosis not present

## 2020-10-04 DIAGNOSIS — B9620 Unspecified Escherichia coli [E. coli] as the cause of diseases classified elsewhere: Secondary | ICD-10-CM | POA: Diagnosis present

## 2020-10-04 DIAGNOSIS — Z20822 Contact with and (suspected) exposure to covid-19: Secondary | ICD-10-CM | POA: Diagnosis present

## 2020-10-04 DIAGNOSIS — K219 Gastro-esophageal reflux disease without esophagitis: Secondary | ICD-10-CM | POA: Diagnosis present

## 2020-10-04 DIAGNOSIS — G4733 Obstructive sleep apnea (adult) (pediatric): Secondary | ICD-10-CM | POA: Diagnosis present

## 2020-10-04 DIAGNOSIS — R338 Other retention of urine: Secondary | ICD-10-CM | POA: Diagnosis not present

## 2020-10-04 DIAGNOSIS — N400 Enlarged prostate without lower urinary tract symptoms: Secondary | ICD-10-CM | POA: Diagnosis present

## 2020-10-04 DIAGNOSIS — J45909 Unspecified asthma, uncomplicated: Secondary | ICD-10-CM | POA: Diagnosis present

## 2020-10-04 DIAGNOSIS — I10 Essential (primary) hypertension: Secondary | ICD-10-CM | POA: Diagnosis present

## 2020-10-04 DIAGNOSIS — Z8546 Personal history of malignant neoplasm of prostate: Secondary | ICD-10-CM | POA: Diagnosis not present

## 2020-10-04 DIAGNOSIS — G9389 Other specified disorders of brain: Secondary | ICD-10-CM | POA: Diagnosis present

## 2020-10-04 DIAGNOSIS — E785 Hyperlipidemia, unspecified: Secondary | ICD-10-CM | POA: Diagnosis present

## 2020-10-04 DIAGNOSIS — A419 Sepsis, unspecified organism: Secondary | ICD-10-CM | POA: Diagnosis present

## 2020-10-04 DIAGNOSIS — Z8673 Personal history of transient ischemic attack (TIA), and cerebral infarction without residual deficits: Secondary | ICD-10-CM

## 2020-10-04 DIAGNOSIS — E872 Acidosis, unspecified: Secondary | ICD-10-CM | POA: Diagnosis present

## 2020-10-04 DIAGNOSIS — R197 Diarrhea, unspecified: Secondary | ICD-10-CM | POA: Diagnosis not present

## 2020-10-04 DIAGNOSIS — G9340 Encephalopathy, unspecified: Secondary | ICD-10-CM | POA: Diagnosis not present

## 2020-10-04 DIAGNOSIS — Z8249 Family history of ischemic heart disease and other diseases of the circulatory system: Secondary | ICD-10-CM

## 2020-10-04 DIAGNOSIS — E782 Mixed hyperlipidemia: Secondary | ICD-10-CM | POA: Diagnosis present

## 2020-10-04 DIAGNOSIS — I679 Cerebrovascular disease, unspecified: Secondary | ICD-10-CM

## 2020-10-04 DIAGNOSIS — N390 Urinary tract infection, site not specified: Secondary | ICD-10-CM

## 2020-10-04 DIAGNOSIS — Z79899 Other long term (current) drug therapy: Secondary | ICD-10-CM | POA: Diagnosis not present

## 2020-10-04 DIAGNOSIS — D696 Thrombocytopenia, unspecified: Secondary | ICD-10-CM | POA: Diagnosis present

## 2020-10-04 DIAGNOSIS — Z7902 Long term (current) use of antithrombotics/antiplatelets: Secondary | ICD-10-CM | POA: Diagnosis not present

## 2020-10-04 DIAGNOSIS — I440 Atrioventricular block, first degree: Secondary | ICD-10-CM | POA: Diagnosis present

## 2020-10-04 DIAGNOSIS — N419 Inflammatory disease of prostate, unspecified: Secondary | ICD-10-CM | POA: Diagnosis present

## 2020-10-04 DIAGNOSIS — Z87891 Personal history of nicotine dependence: Secondary | ICD-10-CM

## 2020-10-04 DIAGNOSIS — E538 Deficiency of other specified B group vitamins: Secondary | ICD-10-CM

## 2020-10-04 DIAGNOSIS — A4151 Sepsis due to Escherichia coli [E. coli]: Secondary | ICD-10-CM

## 2020-10-04 DIAGNOSIS — G9341 Metabolic encephalopathy: Secondary | ICD-10-CM | POA: Diagnosis present

## 2020-10-04 DIAGNOSIS — R3911 Hesitancy of micturition: Secondary | ICD-10-CM | POA: Diagnosis present

## 2020-10-04 LAB — CBG MONITORING, ED: Glucose-Capillary: 148 mg/dL — ABNORMAL HIGH (ref 70–99)

## 2020-10-04 LAB — CBC WITH DIFFERENTIAL/PLATELET
Abs Immature Granulocytes: 0.2 10*3/uL — ABNORMAL HIGH (ref 0.00–0.07)
Basophils Absolute: 0.1 10*3/uL (ref 0.0–0.1)
Basophils Relative: 0 %
Eosinophils Absolute: 0 10*3/uL (ref 0.0–0.5)
Eosinophils Relative: 0 %
HCT: 41.3 % (ref 39.0–52.0)
Hemoglobin: 14.3 g/dL (ref 13.0–17.0)
Immature Granulocytes: 1 %
Lymphocytes Relative: 5 %
Lymphs Abs: 1 10*3/uL (ref 0.7–4.0)
MCH: 28.4 pg (ref 26.0–34.0)
MCHC: 34.6 g/dL (ref 30.0–36.0)
MCV: 82.1 fL (ref 80.0–100.0)
Monocytes Absolute: 1 10*3/uL (ref 0.1–1.0)
Monocytes Relative: 5 %
Neutro Abs: 18.7 10*3/uL — ABNORMAL HIGH (ref 1.7–7.7)
Neutrophils Relative %: 89 %
Platelets: 167 10*3/uL (ref 150–400)
RBC: 5.03 MIL/uL (ref 4.22–5.81)
RDW: 17 % — ABNORMAL HIGH (ref 11.5–15.5)
WBC: 21 10*3/uL — ABNORMAL HIGH (ref 4.0–10.5)
nRBC: 0 % (ref 0.0–0.2)

## 2020-10-04 LAB — RAPID URINE DRUG SCREEN, HOSP PERFORMED
Amphetamines: NOT DETECTED
Barbiturates: NOT DETECTED
Benzodiazepines: NOT DETECTED
Cocaine: NOT DETECTED
Opiates: NOT DETECTED
Tetrahydrocannabinol: NOT DETECTED

## 2020-10-04 LAB — TROPONIN I (HIGH SENSITIVITY)
Troponin I (High Sensitivity): 15 ng/L (ref <18)
Troponin I (High Sensitivity): 18 ng/L — ABNORMAL HIGH (ref <18)

## 2020-10-04 LAB — COMPREHENSIVE METABOLIC PANEL
ALT: 20 U/L (ref 0–44)
AST: 29 U/L (ref 15–41)
Albumin: 4.1 g/dL (ref 3.5–5.0)
Alkaline Phosphatase: 69 U/L (ref 38–126)
Anion gap: 13 (ref 5–15)
BUN: 9 mg/dL (ref 8–23)
CO2: 22 mmol/L (ref 22–32)
Calcium: 9.3 mg/dL (ref 8.9–10.3)
Chloride: 101 mmol/L (ref 98–111)
Creatinine, Ser: 1.06 mg/dL (ref 0.61–1.24)
GFR, Estimated: >60 mL/min (ref >60)
Glucose, Bld: 193 mg/dL — ABNORMAL HIGH (ref 70–99)
Potassium: 4.2 mmol/L (ref 3.5–5.1)
Sodium: 136 mmol/L (ref 135–145)
Total Bilirubin: 1.4 mg/dL — ABNORMAL HIGH (ref 0.3–1.2)
Total Protein: 7.8 g/dL (ref 6.5–8.1)

## 2020-10-04 LAB — RESP PANEL BY RT-PCR (FLU A&B, COVID) ARPGX2
Influenza A by PCR: NEGATIVE
Influenza B by PCR: NEGATIVE
SARS Coronavirus 2 by RT PCR: NEGATIVE

## 2020-10-04 LAB — URINALYSIS, ROUTINE W REFLEX MICROSCOPIC
Bilirubin Urine: NEGATIVE
Glucose, UA: 150 mg/dL — AB
Ketones, ur: NEGATIVE mg/dL
Nitrite: POSITIVE — AB
Protein, ur: 100 mg/dL — AB
Specific Gravity, Urine: 1.017 (ref 1.005–1.030)
WBC, UA: >50 WBC/hpf — ABNORMAL HIGH (ref 0–5)
pH: 6 (ref 5.0–8.0)

## 2020-10-04 LAB — ETHANOL: Alcohol, Ethyl (B): <10 mg/dL (ref <10)

## 2020-10-04 LAB — LACTIC ACID, PLASMA
Lactic Acid, Venous: 5 mmol/L (ref 0.5–1.9)
Lactic Acid, Venous: 4.6 mmol/L (ref 0.5–1.9)
Lactic Acid, Venous: 4.1 mmol/L (ref 0.5–1.9)

## 2020-10-04 MED ORDER — ACETAMINOPHEN 325 MG PO TABS
650.0000 mg | ORAL_TABLET | Freq: Four times a day (QID) | ORAL | Status: DC | PRN
  Administered 2020-10-05 – 2020-10-09 (×2): 650 mg via ORAL
  Filled 2020-10-04 (×3): qty 2

## 2020-10-04 MED ORDER — PIPERACILLIN-TAZOBACTAM 3.375 G IVPB
3.3750 g | Freq: Three times a day (TID) | INTRAVENOUS | Status: DC
  Administered 2020-10-04 – 2020-10-06 (×3): 3.375 g via INTRAVENOUS
  Filled 2020-10-04 (×3): qty 50

## 2020-10-04 MED ORDER — SODIUM CHLORIDE 0.9 % IV SOLN: INTRAVENOUS | Status: DC

## 2020-10-04 MED ORDER — SIMVASTATIN 20 MG PO TABS
40.0000 mg | ORAL_TABLET | Freq: Every day | ORAL | Status: DC
  Administered 2020-10-05 – 2020-10-11 (×7): 40 mg via ORAL
  Filled 2020-10-04 (×7): qty 2

## 2020-10-04 MED ORDER — ACETAMINOPHEN 650 MG RE SUPP: 650.0000 mg | Freq: Four times a day (QID) | RECTAL | Status: DC | PRN

## 2020-10-04 MED ORDER — VANCOMYCIN HCL IN DEXTROSE 1-5 GM/200ML-% IV SOLN: 1000.0000 mg | Freq: Once | INTRAVENOUS | Status: DC

## 2020-10-04 MED ORDER — VANCOMYCIN HCL 1750 MG/350ML IV SOLN
1750.0000 mg | INTRAVENOUS | Status: DC
  Administered 2020-10-05: 1750 mg via INTRAVENOUS
  Filled 2020-10-04: qty 350

## 2020-10-04 MED ORDER — PIPERACILLIN-TAZOBACTAM 3.375 G IVPB 30 MIN
3.3750 g | Freq: Once | INTRAVENOUS | Status: AC
  Administered 2020-10-04: 3.375 g via INTRAVENOUS
  Filled 2020-10-04: qty 50

## 2020-10-04 MED ORDER — VANCOMYCIN HCL 1750 MG/350ML IV SOLN
1750.0000 mg | Freq: Once | INTRAVENOUS | Status: AC
  Administered 2020-10-04: 1750 mg via INTRAVENOUS
  Filled 2020-10-04: qty 350

## 2020-10-04 MED ORDER — IOHEXOL 350 MG/ML SOLN
100.0000 mL | Freq: Once | INTRAVENOUS | Status: AC | PRN
  Administered 2020-10-04: 100 mL via INTRAVENOUS

## 2020-10-04 MED ORDER — ENOXAPARIN SODIUM 40 MG/0.4ML IJ SOSY
40.0000 mg | PREFILLED_SYRINGE | INTRAMUSCULAR | Status: DC
  Administered 2020-10-04 – 2020-10-11 (×8): 40 mg via SUBCUTANEOUS
  Filled 2020-10-04 (×8): qty 0.4

## 2020-10-04 MED ORDER — LACTATED RINGERS IV BOLUS
30.0000 mL/kg | Freq: Once | INTRAVENOUS | Status: AC
  Administered 2020-10-04: 2328 mL via INTRAVENOUS

## 2020-10-04 NOTE — ED Notes (Addendum)
Nurse report given to Phineas Douglas., RN

## 2020-10-04 NOTE — H&P (Signed)
[name] Admission History and Physical Service Pager: [phone]  Patient name: Ricky [name].  Medical record number: [id number] Date of birth: 01-02-1936 Age: 85 y.o. Gender: male  Primary Care Provider: [name], MD Consultants: Neurology Code Status: FULL, per spouse Preferred Emergency Contact: wife, Ricky [name], [phone]  Chief Complaint: AMS  Assessment and Plan: Ricky [name]. is a 85 y.o. male presenting with AMS, concerning for urosepsis. PMH is significant for HTN, HLD, OSA, previous CVA.   AMS  concern for urosepsis 85 yo man found obtunded this morning with altered mental status. Last known normal at 9 PM yesterday. Patient normally takes care of ADLs and mows the lawn. ED course notable for a lactate of 5.0, UA with nitrites, and leukocytosis to 21 with left shift, ANC 18.7. Patient started on Vanc/Zosyn and given 75m/kg fluid resuscitation. UDS negative, EtOH negative. CT head non contrast with likely old infarct of right MCA, CTA  head/neck consistent with this demonstrating previous R MCA stroke and encephalomalacia (see below). CT a/p with possible prostatitis with enlarged prostate. CMP with glc of 193, otherwise unremarkable, CXR unremarkable. On exam patient exhibits severe attentional deficit and is disoriented to place. He is alert, reportedly an improvement from previous somnolence per sister present. Patient was urinating in the ED, but at time of our examination was reporting suprapubic pain, fullness, and was no longer urinating. Will continue to monitor for urinary retention, I/O cath x3 with bladder scans. Presentation concerning for urosepsis given AMS, lactate, UA results, and leukocytosis. Other etiologies for AMS including intoxication, electrolyte disturbances, organ dysfunction, hypoxemia/hypercarbia, and trauma unlikely given history and objective findings. -Admit to [place] attending Dr. PThompson [name] consulted, see  below -Prostate exam when alert and oriented - I/O cath x3 with subsequent bladder scans: if >300cc retained after 3rd cath, place foley catheter -Continue Vanc/Zosyn -Trend LA -F/u Bcx and Ucx  -AM CBC, BMP -mIVF NS at 125 mL/hr -Diet: regular diet, passed bedside swallow -DVT PPX: Lovenox -Up with assistance -PT/OT tomorrow  Previous R MCA stroke with encephalomalacia Poor effort on neuro exam 2/2 AMS. Reports from ED attending of L>R weakness. Initial CTH showing "stable old right MCA territory infarct". CTA head showing "age indeterminate occlusion of the right M1 MCA". Per neuro, this is almost certainly an old infarct. Deficits may have been unmasked by urosepsis in the setting of low cognitive reserve.  -Neuro consulted then signed off, will re-consult if needed  -MR Brain if cognitive status does not improve with abx  First Degree AV Block PR interval 228 ms, noted on recent PSG in July 2022. Dr. TRadford [name] cardiology, recommended zio patch outpatient. -f/u with cardiology for op zio patch  HLD Home medication simvastatin, no lipid panel in chart, but pt has significant CVA history. -Start home Simvastatin 40 mg daily -Lipid panel  HTN BP elevated to 140s-190s/60-110s; most recently WNL at 131/65. He is not on any medications at home. Will continue to monitor. - Will not aggressively tx TN, only if BP >220/120.  OSA Patient had PSG in July 2022 with Ahi 23.9/hr with 53 total apneic events, minimum SpO2 during sleep 85%, indicating moderate OSA. CPAP titration previously to 15cm H2O, but will require repeat titration. Patient not using CPAP at home per spouse. Per chart review, this is because the device stopped working and he went to DME company to drop it off for repair and never picked it up again. Will monitor and if no improvement, can  consider adding CPAP.  Prediabetes Patient reportedly on metformin and sometimes checks BG. Spouse and sister unable to give more history,  no records in care everywhere.  - will monitor glucose on BMP, can add SSI if needed - hold metformin as unclear if patient actually takes it and due to lactic acidosis - Hgb A1c pending  FEN/GI: mIVF NS at 125 mL/hr, regular diet (passed bedside swallow) Prophylaxis: Lovenox  Disposition: TBD  History of Present Illness:  Ricky [name]. is a 85 y.o. male presenting with AMS. He was last known normal last night. His wife reports that she found him lying on the floor obtunded in the morning. She reports that he is normally active, mowing the lawn and walking to the local shopping center. Although "he has been talking less" recently. She reports that he has been going to the bathroom to urinate more frequently than normal. Interview with the patient is limited 2/2 inattention, however he is able to state his name and the city he is in. He is unable to state where he is currently or what date it is.   Review Of Systems: Per HPI with the following additions:  Review of Systems  Constitutional:  Negative for chills and fever.  HENT:  Negative for congestion and rhinorrhea.   Respiratory:  Negative for shortness of breath.   Cardiovascular:  Negative for chest pain.  Gastrointestinal:  Positive for abdominal pain. Negative for diarrhea, nausea and vomiting.  Genitourinary:  Positive for difficulty urinating.  Psychiatric/Behavioral:  Positive for confusion and decreased concentration.     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   Lactic acidosis    Hypotension    HLD (hyperlipidemia)    SIRS (systemic inflammatory response syndrome) ([place])    AKI (acute kidney injury) ([place]) 07/03/2015   Sepsis ([place]) 07/03/2015   Acute encephalopathy 07/03/2015   Dehydration    Diarrhea    ATHEROSCLEROSIS W  /INT CLAUDICATION 04/14/2010   UNSPECIFIED PERIPHERAL VASCULAR DISEASE 04/01/2010    Past Medical History: Past Medical History:  Diagnosis Date   HLD (hyperlipidemia)    HTN (hypertension)    Memory difficulty    OSA on CPAP 06/23/2018   Moderate obstructive sleep apnea with an AHI of 28.3/h with oxygen desaturations as low as 81%.  Now on CPAP at 15 cm H2O.   Prostate cancer ([place])    PVD (peripheral vascular disease) ([place])    Stroke (cerebrum) (HCC)    Right MCA distribution    Past Surgical History: Past Surgical History:  Procedure Laterality Date   BACK SURGERY  1994   cervical   CHOLECYSTECTOMY      Social History: Social History   Tobacco Use   Smoking status: Former    Types: Cigarettes    Quit date: 02/03/1992    Years since quitting: 28.6   Smokeless tobacco: Never  Vaping Use   Vaping Use: Never used  Substance Use Topics   Alcohol use: No   Drug use: No   Additional social history: lives in [place] with spouse Please also refer to relevant sections of EMR.  Family History: Family History  Problem Relation Age of Onset   Heart disease Mother    Heart attack Mother    Heart disease Father    Heart attack Father  Heart disease Brother    Heart attack Brother    Coronary artery disease Unknown     Allergies and Medications: Allergies  Allergen Reactions   Aspirin Other (See Comments)    '81mg'$  causes nose bleeds '81mg'$  causes nose bleeds   No current facility-administered medications on file prior to encounter.   Current Outpatient Medications on File Prior to Encounter  Medication Sig Dispense Refill   cilostazol (PLETAL) 100 MG tablet Take 100 mg by mouth 2 (two) times daily.     esomeprazole (NEXIUM) 40 MG capsule Take 40 mg by mouth daily.     metFORMIN (GLUCOPHAGE) 500 MG tablet Take 500 mg by mouth 2 (two) times daily with a meal.     montelukast (SINGULAIR) 10 MG tablet Take 10 mg by mouth at bedtime.     Naproxen 375 MG TBEC Take 1  tablet (375 mg total) by mouth in the morning and at bedtime. 60 tablet 0   ONETOUCH VERIO test strip USE ONCE A DAY TO CHECK BLOOD SUGARS.     simvastatin (ZOCOR) 40 MG tablet Take 40 mg by mouth at bedtime.      Objective: BP (!) 174/74   Pulse 79   Temp 99.1 F (37.3 C) (Oral)   Resp (!) 24   Ht 6' (1.829 m)   Wt 200 lb (90.7 kg)   SpO2 97%   BMI 27.12 kg/m   Physical Exam Vitals reviewed.  HENT:     Head: Normocephalic and atraumatic.  Cardiovascular:     Rate and Rhythm: Normal rate and regular rhythm.     Heart sounds: No murmur heard. Pulmonary:     Effort: Pulmonary effort is normal.     Breath sounds: Normal breath sounds.  Abdominal:     Palpations: Abdomen is soft.     Tenderness: There is no abdominal tenderness.     Comments: Suprapubic fullness   Genitourinary:    Comments: Urine collection bag in place Skin:    General: Skin is warm and dry.  Neurological:     Mental Status: He is alert. He is disoriented.     Comments: Does not participate in CN exam or UE strength testing. LE strength 4/5.     Labs and Imaging: CBC BMET  Recent Labs  Lab 10/04/20 1123  WBC 21.0*  HGB 14.3  HCT 41.3  PLT 167   Recent Labs  Lab 10/04/20 1123  NA 136  K 4.2  CL 101  CO2 22  BUN 9  CREATININE 1.06  GLUCOSE 193*  CALCIUM 9.3     EKG: NSR with first degree heart block, PR interval of 228 msec.   Corky Sox PGY-1, Psychiatry FPTS Intern pager: [phone], text pages welcome  FPTS Upper-Level Resident Addendum   I have independently interviewed and examined the patient. I have discussed the above with the original author and agree with their documentation. I have made additional edits as necessary. Please see also any attending notes.   Gladys Damme, M.D. PGY-3, [place] [facility] 10/04/2020 8:37 PM  [place] Service pager: [phone] (text pages welcome through [facility])

## 2020-10-04 NOTE — ED Notes (Signed)
RN rounded on pt to find that he had been incontinent of stool in the bed, the chair, and the trash can as well as on the floor. He removed his gown and one IV as well as the external catheter device. RN provided extensive hygiene care to pt and room, performed a full linen change, applied a brief, bandaged IV site, and performed in and out cath as pt verbalizes that he is unable to void. Withdrew 800cc of bloody urine with clots. Then performed bedside swallow screen which the pt passed without difficulty. Provider notified of blood in urine and of passed swallow screen.

## 2020-10-04 NOTE — ED Provider Notes (Signed)
[facility] EMERGENCY DEPARTMENT Provider Note   CSN: [id number] Arrival date & time: 10/04/20  1030     History Chief Complaint  Patient presents with   Fall   Altered Mental Status    Ricky [name]. is a 85 y.o. male.  Mr. Redgate was brought in by EMS after being found on the floor at his home.  He lives with his family, and they last saw him at 48 PM yesterday.  He seems confused, and he is unable to provide a complete history.  He denies any pain.  Update at 11:57 am: I spoke with the patient's sister Ricky [name], who was at bedside.  She states that last known well is probably sometime yesterday.  The patient's wife was at home but was unsure when the patient fell.  His normal baseline is up, ambulatory without assistance, able to fix meals, and able to mow the lawn even though it takes him about 2 days to accomplish the task.  She confirms that he is very different than his baseline.  The history is provided by the EMS personnel. The history is limited by the condition of the patient.  Fall This is a new problem. Episode onset: unknown: sometime between 12 pm yesterday and just prior to EMS arrival. The problem has been resolved. Associated symptoms comments: No verbalized complaints. Nothing aggravates the symptoms. Nothing relieves the symptoms. He has tried nothing for the symptoms. The treatment provided no relief.      Past Medical History:  Diagnosis Date   HLD (hyperlipidemia)    HTN (hypertension)    Memory difficulty    OSA on CPAP 06/23/2018   Moderate obstructive sleep apnea with an AHI of 28.3/h with oxygen desaturations as low as 81%.  Now on CPAP at 15 cm H2O.   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   Lactic acidosis    Hypotension    HLD (hyperlipidemia)    SIRS (systemic inflammatory response syndrome) (HCC)    AKI (acute kidney injury) ([place]) 07/03/2015   Sepsis ([place]) 07/03/2015   Acute encephalopathy 07/03/2015   Dehydration    Diarrhea    ATHEROSCLEROSIS W /INT CLAUDICATION 04/14/2010   UNSPECIFIED PERIPHERAL VASCULAR DISEASE 04/01/2010    Past Surgical History:  Procedure Laterality Date   BACK SURGERY  1994   cervical   CHOLECYSTECTOMY         Family History  Problem Relation Age of Onset   Heart disease Mother    Heart attack Mother    Heart disease Father    Heart attack Father    Heart disease Brother    Heart attack Brother    Coronary artery disease Unknown     Social History   Tobacco Use   Smoking status: Former    Types: Cigarettes    Quit date: 02/03/1992    Years since quitting: 28.6   Smokeless tobacco: Never  Vaping Use   Vaping Use: Never used  Substance Use Topics   Alcohol use: No   Drug use: No    Home Medications Prior to Admission medications   Medication Sig Start Date  End Date Taking? Authorizing Provider  cilostazol (PLETAL) 100 MG tablet Take 100 mg by mouth 2 (two) times daily.    [provider]  metFORMIN (GLUCOPHAGE) 500 MG tablet Take by mouth 2 (two) times daily with a meal.    [provider]  montelukast (SINGULAIR) 10 MG tablet  06/09/17   [provider]  naproxen (NAPROSYN) 375 MG tablet Take 375 mg by mouth 2 (two) times daily as needed (pain).    [provider]  Naproxen 375 MG TBEC Take 1 tablet (375 mg total) by mouth in the morning and at bedtime. 06/13/20   Ricky Killings, MD  ONETOUCH VERIO test strip USE ONCE A DAY TO CHECK BLOOD SUGARS. 01/16/19   [provider]  simvastatin (ZOCOR) 40 MG tablet Take 40 mg by mouth at bedtime.    [provider]    Allergies     Aspirin  Review of Systems   Review of Systems  Unable to perform ROS: Mental status change   Physical Exam Updated Vital Signs BP (!) 186/77 (BP Location: Right Arm)   Pulse 82   Temp 99.1 F (37.3 C) (Oral)   Resp (!) 24   Ht 6' (1.829 m)   Wt 90.7 kg   SpO2 97%   BMI 27.12 kg/m   Physical Exam Vitals and nursing note reviewed.  Constitutional:      Appearance: Normal appearance.  HENT:     Head: Normocephalic and atraumatic.  Eyes:     General: No visual field deficit.    Extraocular Movements: Extraocular movements intact.     Conjunctiva/sclera: Conjunctivae normal.     Pupils: Pupils are equal, round, and reactive to light.     Comments: Pupils are small but reactive  Cardiovascular:     Rate and Rhythm: Normal rate and regular rhythm.     Heart sounds: Normal heart sounds.  Pulmonary:     Effort: Pulmonary effort is normal.     Breath sounds: Normal breath sounds.  Abdominal:     General: There is no distension.     Tenderness: There is no abdominal tenderness. There is no guarding.  Musculoskeletal:     Cervical back: Normal range of motion.     Right lower leg: No edema.     Left lower leg: No edema.  Skin:    General: Skin is warm and dry.  Neurological:     Mental Status: He is alert.     GCS: GCS eye subscore is 3. GCS verbal subscore is 4. GCS motor subscore is 6.     Cranial Nerves: Cranial nerves are intact. No dysarthria or facial asymmetry.     Sensory: Sensation is intact.     Motor: Weakness present.     Comments: He follows commands intermittently but is able to state his name and birthdate.  However, he was confused, and was unable to participate in cerebellar exam.  He had normal strength with the exception of the left lower extremity which had mild drift.  It seemed that he had intact sensation to light touch, but he also has some inattention.  This appeared to affect both sides equally, and I cannot determine clear neglect.  Visual field  testing (confrontation method) was normal.  Psychiatric:        Mood and Affect: Mood normal.        Behavior: Behavior normal.    ED Results / Procedures / Treatments   Labs (all labs  ordered are listed, but only abnormal results are displayed) Labs Reviewed  CBC WITH DIFFERENTIAL/PLATELET - Abnormal; Notable for the following components:      Result Value   WBC 21.0 (*)    RDW 17.0 (*)    Neutro Abs 18.7 (*)    Abs Immature Granulocytes 0.20 (*)    All other components within normal limits  COMPREHENSIVE METABOLIC PANEL - Abnormal; Notable for the following components:   Glucose, Bld 193 (*)    Total Bilirubin 1.4 (*)    All other components within normal limits  URINALYSIS, ROUTINE W REFLEX MICROSCOPIC - Abnormal; Notable for the following components:   APPearance HAZY (*)    Glucose, UA 150 (*)    Hgb urine dipstick MODERATE (*)    Protein, ur 100 (*)    Nitrite POSITIVE (*)    Leukocytes,Ua SMALL (*)    WBC, UA >50 (*)    Bacteria, UA FEW (*)    All other components within normal limits  LACTIC ACID, PLASMA - Abnormal; Notable for the following components:   Lactic Acid, Venous 5.0 (*)    All other components within normal limits  LACTIC ACID, PLASMA - Abnormal; Notable for the following components:   Lactic Acid, Venous 4.6 (*)    All other components within normal limits  CBG MONITORING, ED - Abnormal; Notable for the following components:   Glucose-Capillary 148 (*)    All other components within normal limits  TROPONIN I (HIGH SENSITIVITY) - Abnormal; Notable for the following components:   Troponin I (High Sensitivity) 18 (*)    All other components within normal limits  RESP PANEL BY RT-PCR (FLU A&B, COVID) [id number]  CULTURE, BLOOD (ROUTINE X 2)  CULTURE, BLOOD (ROUTINE X 2)  URINE CULTURE  RAPID URINE DRUG SCREEN, HOSP PERFORMED  ETHANOL  TROPONIN I (HIGH SENSITIVITY)    EKG EKG Interpretation  Date/Time:  Friday October 04 2020 10:39:57  EDT Ventricular Rate:  76 PR Interval:  228 QRS Duration: 88 QT Interval:  359 QTC Calculation: 404 R Axis:   -55 Text Interpretation: Sinus or ectopic atrial rhythm Prolonged PR interval Left anterior fascicular block No acute ischemia Confirmed by Lorre Munroe ([id number]) on 10/04/2020 10:47:33 AM  Radiology CT Head Wo Contrast  Result Date: 10/04/2020 CLINICAL DATA:  Patient was found down.  Mental status changes. EXAM: CT HEAD WITHOUT CONTRAST TECHNIQUE: Contiguous axial images were obtained from the base of the skull through the vertex without intravenous contrast. COMPARISON:  07/03/2015 FINDINGS: Brain: There is no evidence for acute hemorrhage, hydrocephalus, mass lesion, or abnormal extra-axial fluid collection. No definite CT evidence for acute infarction. Diffuse loss of parenchymal volume is consistent with atrophy. Patchy low attenuation in the deep hemispheric and periventricular white matter is nonspecific, but likely reflects chronic microvascular ischemic demyelination. Right MCA territory encephalomalacia is stable. Vascular: No hyperdense vessel or unexpected calcification. Skull: No evidence for fracture. No worrisome lytic or sclerotic lesion. Sinuses/Orbits: The visualized paranasal sinuses and mastoid air cells are clear. Visualized portions of the globes and intraorbital fat are unremarkable. Other: None. IMPRESSION: 1. No acute intracranial abnormality. 2. Atrophy with chronic small vessel white matter ischemic disease. 3. Stable old right MCA territory infarct. Electronically Signed   By: Misty Stanley M.D.   On: 10/04/2020 11:37   DG Chest Port 1 View  Result Date: 10/04/2020 CLINICAL DATA:  Altered mental status.  Evaluate for pneumonia. EXAM: PORTABLE CHEST 1 VIEW COMPARISON:  07/03/2015 FINDINGS: 1102 hours. Low  lung volumes. The lungs are clear without focal pneumonia, edema, pneumothorax or pleural effusion. The cardiopericardial silhouette is within normal limits for size.  Interstitial markings are diffusely coarsened with chronic features. The visualized bony structures of the thorax show no acute abnormality. Telemetry leads overlie the chest. IMPRESSION: No active disease. Electronically Signed   By: Misty Stanley M.D.   On: 10/04/2020 11:35    Procedures Procedures   Medications Ordered in ED Medications - No data to display  ED Course  I have reviewed the triage vital signs and the nursing notes.  Pertinent labs & imaging results that were available during my care of the patient were reviewed by me and considered in my medical decision making (see chart for details).  Clinical Course as of 10/04/20 1416  Fri Oct 04, 2020  1251 Dr. Chauncey Reading and I spoke about the patient. They will admit. [AW]    Clinical Course User Index [AW] Arnaldo Natal, MD   MDM Rules/Calculators/A&P                           [name] arrived with altered mental status after lying on the floor for an unknown length of time.  Initial differential diagnosis was extensive and included acute stroke or other neurologic emergency or more of a delirium secondary to any number of infectious, metabolic, or toxicologic conditions.  Code stroke was not called secondary to length of time since he was last known normal as well as a lack of focal symptoms.  After initial neuroimaging was negative, I did order further neuroimaging.  At this point, labs and urinalysis were not back.  I had also just obtained the history that the patient was markedly different than his usual functional baseline.  When the lactic acid returned elevated, the urinalysis had not come back yet.  I was concerned due to the degree of elevation, and I ordered blood cultures, fluid bolus, and antibiotics.  CT imaging was ordered to evaluate for other sources of sepsis.  Given the data at this point, it does appear that his symptoms are related to urosepsis rather than acute neurologic pathology. Final Clinical  Impression(s) / ED Diagnoses Final diagnoses:  Sepsis ([place])  Sepsis with encephalopathy without septic shock, due to unspecified organism [facility])  Lower urinary tract infectious disease    Rx / DC Orders ED Discharge Orders     None        Arnaldo Natal, MD 10/04/20 1419

## 2020-10-04 NOTE — ED Notes (Signed)
Bladder scan 450 

## 2020-10-04 NOTE — ED Notes (Signed)
Patient transported to CT 

## 2020-10-04 NOTE — ED Triage Notes (Signed)
BIB EMS from home. Lives with family, saw him on the floor this morning 930am today. Last known well time was yesterday around 12pm. Patient does not remember falling.

## 2020-10-04 NOTE — Progress Notes (Signed)
FPTS Interim Progress Note  S: Went to evaluate patient at the start of shift.  Found patient alert in bed.  He was not in any distress.  He was alert and oriented to person and place, he was not sure what year it was and was not able to say what brought him in.  I did discuss with him what brought him in and the fact that he seems to be improving on IV antibiotics.  O: BP 131/65   Pulse 88   Temp 99.1 F (37.3 C) (Oral)   Resp 10   Ht 6' (1.829 m)   Wt 90.7 kg   SpO2 93%   BMI 27.12 kg/m   General: Alert and oriented to person, place, he was not sure what year it was and was not able to say what brought him in.  In no apparent distress Heart: Regular rate and rhythm with no murmurs appreciated Lungs: CTA bilaterally, no wheezing Abdomen: Bowel sounds present, no abdominal pain Neuro: CN II through XII intact, strength 5/5 all in upper and 4/5 in lower extremities bilaterally  A/P: 85 year old male here with altered mental status thought likely due to urosepsis.  He is improving on IV antibiotics and fluids which makes me think we are on the correct track.  He is more alert at this time and is able to say his name and where he is at both not sure what year it is.  Lactic acid is still elevated at 4.1 but is trending down. He has had some urinary retention and has had 3 in and out caths.  On his next check we will do a Foley catheter if indicated.  We will continue to monitor through the night.  Remainder per day team note.    Lurline Del, DO 10/04/2020, 9:45 PM PGY-3, [place] [facility] pager [phone]

## 2020-10-04 NOTE — Progress Notes (Signed)
Pharmacy Antibiotic Note  Ricky [name]. is a 85 y.o. male admitted on 10/04/2020 with sepsis.  Pharmacy has been consulted for Zosyn and vancomycin dosing.  WBC elevated, SCr wnl, LA elevated  Plan: -Zosyn 3.375 gm IV Q 8 hours (EI infusion) -Vancomycin 1750 mg IV Q 24 hrs. Goal AUC 400-550. Expected AUC: 514 SCr used: 1.06 -Monitor CBC, renal fx, cultures and clinical progress -Vanc levels at steady state    Height: 6' (182.9 cm) Weight: 90.7 kg (200 lb) IBW/kg (Calculated) : 77.6  Temp (24hrs), Avg:99.1 F (37.3 C), Min:99.1 F (37.3 C), Max:99.1 F (37.3 C)  Recent Labs  Lab 10/04/20 1123  WBC 21.0*  CREATININE 1.06  LATICACIDVEN 5.0*    Estimated Creatinine Clearance: 58 mL/min (by C-G formula based on SCr of 1.06 mg/dL).    Allergies  Allergen Reactions   Aspirin Other (See Comments)    '81mg'$  causes nose bleeds '81mg'$  causes nose bleeds    Antimicrobials this admission: Zosyn 9/2 >>  Vancomycin 9/2 >>   Dose adjustments this admission:  Microbiology results: 9/2 BCx:    Thank you for allowing pharmacy to be a part of this patient's care.  Albertina Parr, PharmD., BCPS, BCCCP Clinical Pharmacist Please refer to [facility] for unit-specific pharmacist

## 2020-10-04 NOTE — ED Notes (Signed)
ED Provider at bedside. 

## 2020-10-05 DIAGNOSIS — A4151 Sepsis due to Escherichia coli [E. coli]: Secondary | ICD-10-CM

## 2020-10-05 DIAGNOSIS — R338 Other retention of urine: Secondary | ICD-10-CM | POA: Diagnosis not present

## 2020-10-05 DIAGNOSIS — E872 Acidosis: Secondary | ICD-10-CM | POA: Diagnosis not present

## 2020-10-05 LAB — LACTIC ACID, PLASMA
Lactic Acid, Venous: 4.2 mmol/L (ref 0.5–1.9)
Lactic Acid, Venous: 3.2 mmol/L (ref 0.5–1.9)
Lactic Acid, Venous: 2.8 mmol/L (ref 0.5–1.9)

## 2020-10-05 LAB — CBC
HCT: 39.6 % (ref 39.0–52.0)
HCT: 35.5 % — ABNORMAL LOW (ref 39.0–52.0)
Hemoglobin: 13.8 g/dL (ref 13.0–17.0)
Hemoglobin: 12.5 g/dL — ABNORMAL LOW (ref 13.0–17.0)
MCH: 28.2 pg (ref 26.0–34.0)
MCH: 28.1 pg (ref 26.0–34.0)
MCHC: 34.8 g/dL (ref 30.0–36.0)
MCHC: 35.2 g/dL (ref 30.0–36.0)
MCV: 81 fL (ref 80.0–100.0)
MCV: 79.8 fL — ABNORMAL LOW (ref 80.0–100.0)
Platelets: 148 10*3/uL — ABNORMAL LOW (ref 150–400)
Platelets: 141 10*3/uL — ABNORMAL LOW (ref 150–400)
RBC: 4.89 MIL/uL (ref 4.22–5.81)
RBC: 4.45 MIL/uL (ref 4.22–5.81)
RDW: 17 % — ABNORMAL HIGH (ref 11.5–15.5)
RDW: 16.8 % — ABNORMAL HIGH (ref 11.5–15.5)
WBC: 23.4 10*3/uL — ABNORMAL HIGH (ref 4.0–10.5)
WBC: 21.7 10*3/uL — ABNORMAL HIGH (ref 4.0–10.5)
nRBC: 0 % (ref 0.0–0.2)
nRBC: 0 % (ref 0.0–0.2)

## 2020-10-05 LAB — BASIC METABOLIC PANEL
Anion gap: 13 (ref 5–15)
Anion gap: 9 (ref 5–15)
BUN: 10 mg/dL (ref 8–23)
BUN: 9 mg/dL (ref 8–23)
CO2: 19 mmol/L — ABNORMAL LOW (ref 22–32)
CO2: 23 mmol/L (ref 22–32)
Calcium: 9.1 mg/dL (ref 8.9–10.3)
Calcium: 8.8 mg/dL — ABNORMAL LOW (ref 8.9–10.3)
Chloride: 103 mmol/L (ref 98–111)
Chloride: 105 mmol/L (ref 98–111)
Creatinine, Ser: 1.12 mg/dL (ref 0.61–1.24)
Creatinine, Ser: 1.14 mg/dL (ref 0.61–1.24)
GFR, Estimated: >60 mL/min (ref >60)
GFR, Estimated: >60 mL/min (ref >60)
Glucose, Bld: 184 mg/dL — ABNORMAL HIGH (ref 70–99)
Glucose, Bld: 183 mg/dL — ABNORMAL HIGH (ref 70–99)
Potassium: 4.6 mmol/L (ref 3.5–5.1)
Potassium: 4.1 mmol/L (ref 3.5–5.1)
Sodium: 135 mmol/L (ref 135–145)
Sodium: 137 mmol/L (ref 135–145)

## 2020-10-05 LAB — LIPID PANEL
Cholesterol: 162 mg/dL (ref 0–200)
HDL: 65 mg/dL (ref >40)
LDL Cholesterol: 87 mg/dL (ref 0–99)
Total CHOL/HDL Ratio: 2.5 RATIO
Triglycerides: 50 mg/dL (ref <150)
VLDL: 10 mg/dL (ref 0–40)

## 2020-10-05 LAB — HEMOGLOBIN A1C
Hgb A1c MFr Bld: 7.2 % — ABNORMAL HIGH (ref 4.8–5.6)
Mean Plasma Glucose: 159.94 mg/dL

## 2020-10-05 MED ORDER — CHLORHEXIDINE GLUCONATE CLOTH 2 % EX PADS
6.0000 | MEDICATED_PAD | Freq: Every day | CUTANEOUS | Status: DC
  Administered 2020-10-05 – 2020-10-11 (×5): 6 via TOPICAL

## 2020-10-05 MED ORDER — CILOSTAZOL 100 MG PO TABS
100.0000 mg | ORAL_TABLET | Freq: Two times a day (BID) | ORAL | Status: DC
  Administered 2020-10-05 – 2020-10-11 (×14): 100 mg via ORAL
  Filled 2020-10-05 (×16): qty 1

## 2020-10-05 MED ORDER — LACTATED RINGERS IV BOLUS
1000.0000 mL | Freq: Once | INTRAVENOUS | Status: AC
  Administered 2020-10-05: 1000 mL via INTRAVENOUS

## 2020-10-05 NOTE — Progress Notes (Signed)
OT Cancellation Note  Patient Details Name: Ricky [name]. MRN: [id number] DOB: 12-Aug-1935   Cancelled Treatment:    Reason Eval/Treat Not Completed: Patient declined, complained of lack of sleep, wanting to rest.  Requesting OT eval next date.    Ricky [name] D Ricky [name] 10/05/2020, 4:29 PM

## 2020-10-05 NOTE — Progress Notes (Signed)
Physical Therapy Evaluation Patient Details Name: Ricky [name]. MRN: [id number] DOB: [birth year] Today's Date: 10/05/2020   History of Present Illness  Pt is an 85yo male presenting to [facility] ED on 9/2 after family found him on the floor, pt does not remember falling. CT with no acute findings. Thought to have sepsis possibly from urinary source. PMH: HTN, OSA, history of CVA.   Clinical Impression  Pt presents with the impairments above and problems listed below. Required min assist for bed mobility, min guard for transfers, and min guard to supervision for ambulation to chair with RW. Upon standing, pt reported that he had a BM; cleaned up pt and further mobility deferred secondary to bowel incontinence. Recommending HHPT with supervision upon discharge to address current impairments. We will continue to follow him acutely to promote independence with functional mobility.     Follow Up Recommendations Home health PT;Supervision - Intermittent    Equipment Recommendations  3in1 (PT);Rolling walker with 5" wheels    Recommendations for Other Services       Precautions / Restrictions Precautions Precautions: Fall Restrictions Weight Bearing Restrictions: No      Mobility  Bed Mobility Overal bed mobility: Needs Assistance Bed Mobility: Supine to Sit     Supine to sit: Min assist;Min guard     General bed mobility comments: Pt required min assist for scooting hips to EOB; min guard for the rest of bed mobility.    Transfers Overall transfer level: Needs assistance Equipment used: Rolling walker (2 wheeled) Transfers: Sit to/from Stand Sit to Stand: Min guard;Supervision         General transfer comment: Pt min guard for safety during transfer, minimal cues for sequencing and hand placement. Upon standing pt reported that he was having a BM; pt able to stand with supervision level assist during cleanup. RN notified.  Ambulation/Gait Ambulation/Gait assistance: Min  guard Gait Distance (Feet): 2 Feet Assistive device: Rolling walker (2 wheeled) Gait Pattern/deviations: Step-through pattern;Decreased stride length;Decreased dorsiflexion - right;Decreased dorsiflexion - left Gait velocity: decreased   General Gait Details: Pt ambulated from bed to chair  with min guard for safety only. Demonstrated decreased stride length and ankle DF. No overt LOB noted. Further mobility limited secondary to bowel incontinence  Stairs            Wheelchair Mobility    Modified Rankin (Stroke Patients Only)       Balance Overall balance assessment: Needs assistance Sitting-balance support: Feet supported;No upper extremity supported Sitting balance-Leahy Scale: Fair     Standing balance support: Bilateral upper extremity supported;During functional activity Standing balance-Leahy Scale: Poor Standing balance comment: Pt reliant on BUE on RW during static stance and mobility tasks.                             Pertinent Vitals/Pain Pain Assessment: Faces (Pt reports he has pain with urination; educated pt on utilizing his catheter.) Faces Pain Scale: Hurts a little bit Pain Location: pain with urination Pain Intervention(s): Monitored during session    [place] expects to be discharged to:: Private residence Living Arrangements: Spouse/significant other (Wife Juel Burrow) Available Help at Discharge: Family;Available 24 hours/day Type of Home: House Home Access: Stairs to enter Entrance Stairs-Rails: Psychiatric nurse of Steps: 3 Home Layout: One level Home Equipment: Cane - single point;Shower seat Additional Comments: Pt provided home environment but unable to verify with family member.    Prior Function  Level of Independence: Needs assistance   Gait / Transfers Assistance Needed: Independent with gait, has used cane in the past when feeling unsteady.  ADL's / Homemaking Assistance Needed: Wife  helps with medication management and cooking. Independent with other ADLs        Hand Dominance        Extremity/Trunk Assessment   Upper Extremity Assessment Upper Extremity Assessment: Defer to OT evaluation    Lower Extremity Assessment Lower Extremity Assessment: RLE deficits/detail;LLE deficits/detail RLE Deficits / Details: Strength grossly 4/5; ROM WFL. Pt reports that he feels his RLE is weaker. LLE Deficits / Details: Strength grossly 4/5; ROM WFL.    Cervical / Trunk Assessment Cervical / Trunk Assessment: Normal  Communication   Communication: HOH  Cognition Arousal/Alertness: Awake/alert Behavior During Therapy: WFL for tasks assessed/performed Overall Cognitive Status: No family/caregiver present to determine baseline cognitive functioning                                 General Comments: Pt AOx3, Responds well to simple commands and closed-ended questions      General Comments General comments (skin integrity, edema, etc.): Pt's sister Fraser Din present upon entry but left at beginning of session.    Exercises     Assessment/Plan    PT Assessment Patient needs continued PT services  PT Problem List Decreased strength;Decreased range of motion;Decreased activity tolerance;Decreased balance;Decreased mobility;Decreased knowledge of use of DME;Decreased safety awareness;Decreased knowledge of precautions       PT Treatment Interventions DME instruction;Gait training;Stair training;Functional mobility training;Therapeutic activities;Therapeutic exercise;Balance training;Neuromuscular re-education;Cognitive remediation;Patient/family education    PT Goals (Current goals can be found in the Care Plan section)  Acute Rehab PT Goals Patient Stated Goal: To figure out why he was on the floor PT Goal Formulation: With patient Time For Goal Achievement: 10/19/20 Potential to Achieve Goals: Good    Frequency Min 3X/week   Barriers to discharge         Co-evaluation               AM-PAC PT "6 Clicks" Mobility  Outcome Measure Help needed turning from your back to your side while in a flat bed without using bedrails?: None Help needed moving from lying on your back to sitting on the side of a flat bed without using bedrails?: A Little Help needed moving to and from a bed to a chair (including a wheelchair)?: A Little Help needed standing up from a chair using your arms (e.g., wheelchair or bedside chair)?: A Little Help needed to walk in hospital room?: A Little Help needed climbing 3-5 steps with a railing? : A Lot 6 Click Score: 18    End of Session Equipment Utilized During Treatment: Gait belt Activity Tolerance: Patient tolerated treatment well Patient left: in chair;with call bell/phone within reach;with chair alarm set Nurse Communication: Mobility status;Other (comment) (Pt with diarrhea) PT Visit Diagnosis: Difficulty in walking, not elsewhere classified (R26.2);History of falling (Z91.81);Muscle weakness (generalized) (M62.81)    Time: [id number] PT Time Calculation (min) (ACUTE ONLY): 26 min   Charges:   PT Evaluation $PT Eval Moderate Complexity: 1 Mod PT Treatments $Therapeutic Activity: 8-22 mins        Dawayne Cirri, SPT Dawayne Cirri 10/05/2020, 12:28 PM

## 2020-10-05 NOTE — Progress Notes (Signed)
Went to evaluate patient after noted that his lactic acid had increased slightly from 4.1 to 4.2.  Found patient sleeping comfortably.  He was in no distress.  Mucous membranes were somewhat dry, he had no crackles present in his lungs via auscultation, per chart review most recent echo in 2019 showed no signs of heart failure and he has normal renal function.  Likely remains somewhat fluid down so we will give him another lactated Ringer bolus.  He also continues on normal saline at 125 cc/h.

## 2020-10-05 NOTE — Plan of Care (Signed)

## 2020-10-05 NOTE — Plan of Care (Signed)
Pt receiving IV antibiotics. Confused but easily oriented. RA, no tele orders. Foley intact and patent. Multiple stools during the day type 7. 1 PIV intact. Sister updated and visited today.   Problem: Education: Goal: Knowledge of General Education information will improve Description: Including pain rating scale, medication(s)/side effects and non-pharmacologic comfort measures Outcome: Progressing   Problem: Health Behavior/Discharge Planning: Goal: Ability to manage health-related needs will improve Outcome: Progressing   Problem: Clinical Measurements: Goal: Ability to maintain clinical measurements within normal limits will improve Outcome: Progressing Goal: Will remain free from infection Outcome: Progressing Goal: Diagnostic test results will improve Outcome: Progressing Goal: Respiratory complications will improve Outcome: Progressing Goal: Cardiovascular complication will be avoided Outcome: Progressing   Problem: Activity: Goal: Risk for activity intolerance will decrease Outcome: Progressing   Problem: Nutrition: Goal: Adequate nutrition will be maintained Outcome: Progressing   Problem: Coping: Goal: Level of anxiety will decrease Outcome: Progressing   Problem: Elimination: Goal: Will not experience complications related to bowel motility Outcome: Progressing Goal: Will not experience complications related to urinary retention Outcome: Progressing   Problem: Pain Managment: Goal: General experience of comfort will improve Outcome: Progressing   Problem: Safety: Goal: Ability to remain free from injury will improve Outcome: Progressing   Problem: Skin Integrity: Goal: Risk for impaired skin integrity will decrease Outcome: Progressing

## 2020-10-05 NOTE — Progress Notes (Addendum)
Family Medicine Teaching Service Daily Progress Note Intern Pager: [phone]  Patient name: Ricky [name]. Medical record number: [id number] Date of birth: 12/13/1935 Age: 85 y.o. Gender: male  Primary Care Provider: [name], MD Consultants: Neurology  Code Status: Full, per spouse  Pt Overview and Major Events to Date:  9/2: Admitted   Assessment and Plan: Ricky [name]. is a 85 y.o. male presenting with AMS, concerning for urosepsis. PMH is significant for HTN, HLD, OSA, previous CVA.    AMS likely secondary to urinary source  Seems to be improving but still unclear of baseline. Per patient, he seems to be exhibiting appropriate mentation and follows all commands. States that he walks with assistance of a cane and wife assists with food preparation at home. Lactic acid trending downward 5>4.6>4.1>4.2>3.2>2.8. -neurology initially consulted given concern for new stroke but signed off -continue vancomycin and zosyn  -monitor mental status, consider MRI if no improvement with antibiotics  -bladder scans as appropriate  -follow up blood and urine cx -continue to monitor lactic acid -continue NS at 125 mL/hr, s/p LR bolus, discontinue once LA normalizes  -AM CBC, BMP -Up with assistance -PT/OT  -will try to contact wife to get more background   History of stroke  PAD Previous right MCA stroke noted on imaging, verified by patient. Neurology initially consulted, deemed to be due to old stroke rather than current. Home med includes cilostazol 100 mg bid.  -re-consult neuro as appropriate  -will consider restarting cilostazol   First Degree AV Block PR interval 228 ms, noted on recent PSG in July 2022. Dr. Radford Pax, cardiology, recommended zio patch outpatient. -follow up cardiology outpatient for possible op zio patch -monitor EKG as appropriate or if any changes noted    HLD Home medication simvastatin, lipid panel unremarkable. -continue Simvastatin 40 mg daily  Elevated  blood pressure Noted to have elevated blood pressures on multiple occasions during this hospitalization. No home medications.  -continue to monitor -if within permissive hypertension window then will defer treatment at this time but current condition possibly not due to stroke    OSA Does not use CPAP regularly at home. -consider VBG if mental status declines, then add CPAP if appropriate    Prediabetes Seems to be poorly controlled at home prior to hospitalization. A1c 7.2. Glucose 183. -monitor BMP daily, consider CBGs if appropriate  -hold metformin -consider placing in sSSI if needed    FEN/GI: regular  PPx: Lovenox  Dispo:Home pending clinical improvement.   Subjective:  No acute overnight events reported. Patient denies any pain or concerns at this time.   Objective: Temp:  [98 F (36.7 C)-99.6 F (37.6 C)] 98 F (36.7 C) (09/03 0628) Pulse Rate:  [67-102] 91 (09/03 0628) Resp:  [10-24] 16 (09/03 0628) BP: (128-191)/(65-113) 147/72 (09/03 0628) SpO2:  [93 %-99 %] 99 % (09/03 0628) Weight:  [90.7 kg] 90.7 kg (09/02 1045) Physical Exam: General: Patient laying comfortably in bed, in no acute distress. Cardiovascular: RRR, no murmurs or gallops auscultation  Respiratory: CTAB, breathing comfortably on room air Abdomen: soft, nontender, presence of bowel sounds Neuro: AOx2 (thinks he is at [employer]), CN 2-12 grossly intact with exception of slightly decreased CN 8 on right side, 5/5 UE and LE strength bilaterally, gross sensation intact, no asterixis noted  Extremities: radial and distal pulses strong and equal bilaterally, no LE edema noted bilaterally Psych: mood appropriate, no agitation noted   Laboratory: Recent Labs  Lab 10/04/20 1123 10/05/20 0021 10/05/20 0357  WBC 21.0* 23.4* 21.7*  HGB 14.3 13.8 12.5*  HCT 41.3 39.6 35.5*  PLT 167 148* 141*   Recent Labs  Lab 10/04/20 1123 10/05/20 0021 10/05/20 0357  NA 136 135 137  K 4.2 4.6 4.1  CL 101 103  105  CO2 22 19* 23  BUN '9 10 9  '$ CREATININE 1.06 1.12 1.14  CALCIUM 9.3 9.1 8.8*  PROT 7.8  --   --   BILITOT 1.4*  --   --   ALKPHOS 69  --   --   ALT 20  --   --   AST 29  --   --   GLUCOSE 193* 184* 183*      Imaging/Diagnostic Tests: CT Angio Head W or Wo Contrast  Result Date: 10/04/2020 CLINICAL DATA:  Stroke/TIA, assess intracranial arteries; Stroke/TIA, assess extracranial arteries EXAM: CT ANGIOGRAPHY HEAD AND NECK TECHNIQUE: Multidetector CT imaging of the head and neck was performed using the standard protocol during bolus administration of intravenous contrast. Multiplanar CT image reconstructions and MIPs were obtained to evaluate the vascular anatomy. Carotid stenosis measurements (when applicable) are obtained utilizing NASCET criteria, using the distal internal carotid diameter as the denominator. CONTRAST:  18m OMNIPAQUE IOHEXOL 350 MG/ML SOLN COMPARISON:  None. CT Head from the same day. FINDINGS: CT HEAD FINDINGS Brain: Remote infarcts in the right MCA territory. Additional patchy white matter hypodensities, nonspecific but compatible with chronic microvascular ischemic disease. Moderate atrophy with ex vacuo ventricular dilation. No evidence of acute hemorrhage, hydrocephalus, mass lesion, or abnormal mass effect. Similar mineralization along the falx. Vascular: See below. Skull: No acute fracture. Sinuses: Clear sinuses. Other: Small gas at the skull base and within the superior right orbit, likely within veins and due to intravenous injection. No mastoid effusions. Review of the MIP images confirms the above findings CTA NECK FINDINGS Aortic arch: Great vessel origins are patent. Right carotid system: Atherosclerosis at the carotid bifurcation without greater than 50% stenosis. Left carotid system: Atherosclerosis at the carotid bifurcation without greater than 50% stenosis relative to the distal vessel. Vertebral arteries: Multifocal moderate left and mild right vertebral  artery stenosis. Skeleton: Severe multilevel degenerative change of the cervical spine with fusion across the disc spaces at multiple levels. Other neck: No acute findings. Upper chest: Please see same day CT chest for intrathoracic evaluation. Review of the MIP images confirms the above findings CTA HEAD FINDINGS Anterior circulation: Bilateral intracranial ICAs are patent. Mild-to-moderate stenosis of the intracranial ICAs bilaterally due to calcific atherosclerosis. Occlusion of the right M1 MCA with small collateral vessels in this region and asymmetrically diminished opacification of more distal right MCA vessels. Left MCA and bilateral ACAs are patent. Posterior circulation: Mild multifocal narrowing of the right intradural vertebral artery. Moderate stenosis of the distal left intradural vertebral artery. Basilar artery and bilateral posterior cerebral arteries are patent without proximal hemodynamically significant stenosis. No aneurysm identified. Venous sinuses: As permitted by contrast timing, patent. Scattered venous gas, likely related to intravenous injection. Review of the MIP images confirms the above findings IMPRESSION: CT head: 1. No evidence of acute intracranial abnormality. MRI could provide more sensitive evaluation for acute infarct if clinically indicated. 2. Remote right MCA territory infarcts. 3. Chronic microvascular ischemic disease and atrophy. CTA head: 1. Age indeterminate occlusion of the right M1 MCA with small collateral vessels in this region and asymmetrically diminished opacification of more distal right MCA vessels. 2. Moderate distal left intradural vertebral artery stenosis. 3. Mild-to-moderate bilateral intracranial ICA stenosis.  CTA neck: 1. Multifocal moderate left and mild right vertebral artery stenosis. 2. Calcific atherosclerosis at bilateral carotid bifurcations without greater than 50% stenosis. 3. Please see same day CT chest for intrathoracic evaluation. Findings  discussed with Dr. Joya Gaskins via telephone at 2:55 a.m. Electronically Signed   By: Margaretha Sheffield M.D.   On: 10/04/2020 15:01   CT Head Wo Contrast  Result Date: 10/04/2020 CLINICAL DATA:  Patient was found down.  Mental status changes. EXAM: CT HEAD WITHOUT CONTRAST TECHNIQUE: Contiguous axial images were obtained from the base of the skull through the vertex without intravenous contrast. COMPARISON:  07/03/2015 FINDINGS: Brain: There is no evidence for acute hemorrhage, hydrocephalus, mass lesion, or abnormal extra-axial fluid collection. No definite CT evidence for acute infarction. Diffuse loss of parenchymal volume is consistent with atrophy. Patchy low attenuation in the deep hemispheric and periventricular white matter is nonspecific, but likely reflects chronic microvascular ischemic demyelination. Right MCA territory encephalomalacia is stable. Vascular: No hyperdense vessel or unexpected calcification. Skull: No evidence for fracture. No worrisome lytic or sclerotic lesion. Sinuses/Orbits: The visualized paranasal sinuses and mastoid air cells are clear. Visualized portions of the globes and intraorbital fat are unremarkable. Other: None. IMPRESSION: 1. No acute intracranial abnormality. 2. Atrophy with chronic small vessel white matter ischemic disease. 3. Stable old right MCA territory infarct. Electronically Signed   By: Misty Stanley M.D.   On: 10/04/2020 11:37   CT Angio Neck W and/or Wo Contrast  Result Date: 10/04/2020 CLINICAL DATA:  Stroke/TIA, assess intracranial arteries; Stroke/TIA, assess extracranial arteries EXAM: CT ANGIOGRAPHY HEAD AND NECK TECHNIQUE: Multidetector CT imaging of the head and neck was performed using the standard protocol during bolus administration of intravenous contrast. Multiplanar CT image reconstructions and MIPs were obtained to evaluate the vascular anatomy. Carotid stenosis measurements (when applicable) are obtained utilizing NASCET criteria, using the  distal internal carotid diameter as the denominator. CONTRAST:  134m OMNIPAQUE IOHEXOL 350 MG/ML SOLN COMPARISON:  None. CT Head from the same day. FINDINGS: CT HEAD FINDINGS Brain: Remote infarcts in the right MCA territory. Additional patchy white matter hypodensities, nonspecific but compatible with chronic microvascular ischemic disease. Moderate atrophy with ex vacuo ventricular dilation. No evidence of acute hemorrhage, hydrocephalus, mass lesion, or abnormal mass effect. Similar mineralization along the falx. Vascular: See below. Skull: No acute fracture. Sinuses: Clear sinuses. Other: Small gas at the skull base and within the superior right orbit, likely within veins and due to intravenous injection. No mastoid effusions. Review of the MIP images confirms the above findings CTA NECK FINDINGS Aortic arch: Great vessel origins are patent. Right carotid system: Atherosclerosis at the carotid bifurcation without greater than 50% stenosis. Left carotid system: Atherosclerosis at the carotid bifurcation without greater than 50% stenosis relative to the distal vessel. Vertebral arteries: Multifocal moderate left and mild right vertebral artery stenosis. Skeleton: Severe multilevel degenerative change of the cervical spine with fusion across the disc spaces at multiple levels. Other neck: No acute findings. Upper chest: Please see same day CT chest for intrathoracic evaluation. Review of the MIP images confirms the above findings CTA HEAD FINDINGS Anterior circulation: Bilateral intracranial ICAs are patent. Mild-to-moderate stenosis of the intracranial ICAs bilaterally due to calcific atherosclerosis. Occlusion of the right M1 MCA with small collateral vessels in this region and asymmetrically diminished opacification of more distal right MCA vessels. Left MCA and bilateral ACAs are patent. Posterior circulation: Mild multifocal narrowing of the right intradural vertebral artery. Moderate stenosis of the distal  left  intradural vertebral artery. Basilar artery and bilateral posterior cerebral arteries are patent without proximal hemodynamically significant stenosis. No aneurysm identified. Venous sinuses: As permitted by contrast timing, patent. Scattered venous gas, likely related to intravenous injection. Review of the MIP images confirms the above findings IMPRESSION: CT head: 1. No evidence of acute intracranial abnormality. MRI could provide more sensitive evaluation for acute infarct if clinically indicated. 2. Remote right MCA territory infarcts. 3. Chronic microvascular ischemic disease and atrophy. CTA head: 1. Age indeterminate occlusion of the right M1 MCA with small collateral vessels in this region and asymmetrically diminished opacification of more distal right MCA vessels. 2. Moderate distal left intradural vertebral artery stenosis. 3. Mild-to-moderate bilateral intracranial ICA stenosis. CTA neck: 1. Multifocal moderate left and mild right vertebral artery stenosis. 2. Calcific atherosclerosis at bilateral carotid bifurcations without greater than 50% stenosis. 3. Please see same day CT chest for intrathoracic evaluation. Findings discussed with Dr. Joya Gaskins via telephone at 2:55 a.m. Electronically Signed   By: Margaretha Sheffield M.D.   On: 10/04/2020 15:01   CT CHEST ABDOMEN PELVIS W CONTRAST  Result Date: 10/04/2020 CLINICAL DATA:  Sepsis, altered mental status. Found on floor at home. EXAM: CT CHEST, ABDOMEN, AND PELVIS WITH CONTRAST TECHNIQUE: Multidetector CT imaging of the chest, abdomen and pelvis was performed following the standard protocol during bolus administration of intravenous contrast. CONTRAST:  155m OMNIPAQUE IOHEXOL 350 MG/ML SOLN COMPARISON:  None. FINDINGS: CT CHEST FINDINGS Cardiovascular: Coronary artery calcification and aortic atherosclerotic calcification. No acute findings aorta great vessels. Mediastinum/Nodes: No axillary or supraclavicular adenopathy. No mediastinal or hilar  adenopathy. No pericardial fluid. Esophagus normal. Lungs/Pleura: No pneumonia. No pulmonary infarction. Pleural fluid. Musculoskeletal: No aggressive osseous lesion. CT ABDOMEN AND PELVIS FINDINGS Hepatobiliary: Multiple low-density lesions in the liver most consistent benign cysts. Postcholecystectomy. Pancreas: Pancreas is normal. No ductal dilatation. No pancreatic inflammation. Spleen: Normal spleen Adrenals/urinary tract: Adrenal glands normal. Kidneys enhance symmetrically. No hydronephrosis or hydroureter. Ureters and bladder appear normal. Stomach/Bowel: Stomach, small bowel, appendix, and cecum are normal. Multiple diverticula of the sigmoid colon without acute inflammation. Vascular/Lymphatic: Abdominal aorta is normal caliber with atherosclerotic calcification. There is no retroperitoneal or periportal lymphadenopathy. No pelvic lymphadenopathy. Reproductive: Prostate gland is enlarged measuring 6.7 by 6.7 by 5.8 cm. There is stranding within the mesorectal fat adjacent to the Seminal vesicles (image 29/8). Mild haziness in the fat of the recto prostatic angles. Other: No free fluid. Musculoskeletal: No aggressive osseous lesion. IMPRESSION: 1. No clear source of infection on CT chest abdomen pelvis. 2. Haziness in the mesorectal fat adjacent to the prostate gland and seminal vesicles. Recommend clinical correlation for prostatitis. 3. Extensive sigmoid colon diverticulosis without evidence acute diverticulitis. Electronically Signed   By: SSuzy BouchardM.D.   On: 10/04/2020 15:15   DG Chest Port 1 View  Result Date: 10/04/2020 CLINICAL DATA:  Altered mental status.  Evaluate for pneumonia. EXAM: PORTABLE CHEST 1 VIEW COMPARISON:  07/03/2015 FINDINGS: 1102 hours. Low lung volumes. The lungs are clear without focal pneumonia, edema, pneumothorax or pleural effusion. The cardiopericardial silhouette is within normal limits for size. Interstitial markings are diffusely coarsened with chronic features.  The visualized bony structures of the thorax show no acute abnormality. Telemetry leads overlie the chest. IMPRESSION: No active disease. Electronically Signed   By: EMisty StanleyM.D.   On: 10/04/2020 11:35     GDonney Dice DO 10/05/2020, 7:00 AM PGY-2, [place]ylvan SpringsIntern pager: [phone] text pages welcome

## 2020-10-06 DIAGNOSIS — R338 Other retention of urine: Secondary | ICD-10-CM | POA: Diagnosis not present

## 2020-10-06 DIAGNOSIS — A4151 Sepsis due to Escherichia coli [E. coli]: Secondary | ICD-10-CM | POA: Diagnosis not present

## 2020-10-06 LAB — BASIC METABOLIC PANEL
Anion gap: 4 — ABNORMAL LOW (ref 5–15)
BUN: 9 mg/dL (ref 8–23)
CO2: 24 mmol/L (ref 22–32)
Calcium: 8.4 mg/dL — ABNORMAL LOW (ref 8.9–10.3)
Chloride: 108 mmol/L (ref 98–111)
Creatinine, Ser: 1.06 mg/dL (ref 0.61–1.24)
GFR, Estimated: >60 mL/min (ref >60)
Glucose, Bld: 169 mg/dL — ABNORMAL HIGH (ref 70–99)
Potassium: 3.6 mmol/L (ref 3.5–5.1)
Sodium: 136 mmol/L (ref 135–145)

## 2020-10-06 LAB — URINE CULTURE: Culture: >=100000 — AB

## 2020-10-06 LAB — CBC
HCT: 33.1 % — ABNORMAL LOW (ref 39.0–52.0)
Hemoglobin: 11.8 g/dL — ABNORMAL LOW (ref 13.0–17.0)
MCH: 28.6 pg (ref 26.0–34.0)
MCHC: 35.6 g/dL (ref 30.0–36.0)
MCV: 80.1 fL (ref 80.0–100.0)
Platelets: 112 10*3/uL — ABNORMAL LOW (ref 150–400)
RBC: 4.13 MIL/uL — ABNORMAL LOW (ref 4.22–5.81)
RDW: 16.9 % — ABNORMAL HIGH (ref 11.5–15.5)
WBC: 15.2 10*3/uL — ABNORMAL HIGH (ref 4.0–10.5)
nRBC: 0 % (ref 0.0–0.2)

## 2020-10-06 LAB — LACTIC ACID, PLASMA: Lactic Acid, Venous: 1.8 mmol/L (ref 0.5–1.9)

## 2020-10-06 MED ORDER — CEFDINIR 300 MG PO CAPS
300.0000 mg | ORAL_CAPSULE | Freq: Two times a day (BID) | ORAL | Status: DC
  Administered 2020-10-06 – 2020-10-08 (×4): 300 mg via ORAL
  Filled 2020-10-06 (×4): qty 1

## 2020-10-06 NOTE — Progress Notes (Addendum)
Family Medicine Teaching Service Daily Progress Note Intern Pager: [phone]  Patient name: Ricky [name]. Medical record number: [id number] Date of birth: [birth year] Age: 85 y.o. Gender: male  Primary Care Provider: [name], MD Consultants: None Code Status: FULL  Pt Overview and Major Events to Date:  9/2: admitted, AMS 9/3: Ucx with 100K E. Coli, pan-susceptible; switched from Zosyn to Cefdinir, improved mental status  Assessment and Plan: Knowledge Ricky [name] is an 85 year old male presenting with AMS, concerning for urosepsis.  Past medical history significant for hypertension, hyperlipidemia, OSA, previous CVA.  Altered mental status likely secondary to urospesis Patient with markedly improved mentation compared to admission.  He still remains slightly disoriented.  Lactic acid is normalized at 1.8 most recently.  Urine culture positive for E. coli 100 K CFU.  Pan-susceptible. -14 d course of PO cefdinir starting today (9/4-9/17) -Discontinue Zosyn -Continue to monitor mental status -Contact wife and sister to ask if patient is at baseline -DC IV fluids given patient able to drink appropriately and lactate normalized -Repeat CBC to trend thrombocytopenia and anemia -Up with assistance -PT/OT   Urinary retention Patient with reported pre-hospital urinary hesitancy. Significant difficulty voiding in the ED. Not on an alpha-blocker. Foley placed. CT A/P showing evidence of prostatitis (mesorectal fat stranding). Not on an alpha blocker. Concern for acute/chronic prostatitis, given present admission for urosepsis. Has been declining prostate exam.   -Voiding trial today -Urology follow-up outpatient -Consider Flomax as an outpatient -Consider extending antibiotic course for suspected prostatitis  History of stroke  PAD Previous right MCA stroke noted on imaging, verified by patient. Neurology initially consulted, deemed to be due to old stroke rather than current. Home med includes  cilostazol 100 mg bid.  -Restarted cilostazol   First Degree AV Block PR interval 228 ms, noted on recent PSG in July 2022. Dr. Radford Pax, cardiology, recommended zio patch outpatient. -follow up cardiology outpatient for possible op zio patch -monitor EKG as appropriate or if any changes noted    HLD Home medication simvastatin, lipid panel unremarkable. -continue Simvastatin 40 mg daily   Elevated blood pressure Noted to have elevated blood pressures on multiple occasions during this hospitalization. No home medications.  -continue to monitor -Defer management to outpatient provider   OSA Does not use CPAP regularly at home. -No intervention needed   DM A1c 7.2 on admission, Glucose mid to high 100's throughout admission thus far.  -hold metformin given previous lactic acidosis -Will monitor with daily AM CBG    FEN/GI: PO fluids, regular diet PPx: Lovenox Dispo: home in likely 1-2 days  Subjective:  Patient alert this AM and able to engage in interview (much improved from admission). He is oriented to location, but gives an incorrect date and month. He denies CP, SoB, N/V, and reports normal BM. He reports continued suprapubic fullness.   Objective: Temp:  [97.9 F (36.6 C)-99.9 F (37.7 C)] 99.6 F (37.6 C) (09/04 0500) Pulse Rate:  [69-87] 87 (09/04 0500) Resp:  [16-18] 16 (09/04 0500) BP: (135-162)/(66-83) 135/71 (09/04 0500) SpO2:  [97 %-100 %] 98 % (09/04 0500) Physical Exam Vitals reviewed.  Cardiovascular:     Rate and Rhythm: Normal rate and regular rhythm.  Pulmonary:     Effort: Pulmonary effort is normal.     Breath sounds: Normal breath sounds.  Abdominal:     General: Bowel sounds are normal.     Palpations: Abdomen is soft.     Tenderness: There is no abdominal tenderness.  Neurological:  Mental Status: He is alert.      Laboratory: Recent Labs  Lab 10/05/20 0021 10/05/20 0357 10/06/20 0101  WBC 23.4* 21.7* 15.2*  HGB 13.8 12.5* 11.8*   HCT 39.6 35.5* 33.1*  PLT 148* 141* 112*   Recent Labs  Lab 10/04/20 1123 10/05/20 0021 10/05/20 0357 10/06/20 0101  NA 136 135 137 136  K 4.2 4.6 4.1 3.6  CL 101 103 105 108  CO2 22 19* 23 24  BUN '9 10 9 9  '$ CREATININE 1.06 1.12 1.14 1.06  CALCIUM 9.3 9.1 8.8* 8.4*  PROT 7.8  --   --   --   BILITOT 1.4*  --   --   --   ALKPHOS 69  --   --   --   ALT 20  --   --   --   AST 29  --   --   --   GLUCOSE 193* 184* 183* 169*    Imaging/Diagnostic Tests: Ucx and susceptibilities as above  Corky Sox PGY-1, Psychiatry [facility]PT[facility] Intern pager: [phone], text pages welcome

## 2020-10-06 NOTE — Progress Notes (Signed)
Antibiotics were d/c today and there is no order for IV medications at this time. Talked patient's RN Katie regarding this matter. Recommended not to have PIV access at this time. Joellen Jersey will talk to MD. She will put in the consult if she need it. HS [employer]

## 2020-10-06 NOTE — Progress Notes (Signed)
FPTS Brief Progress Note  S: Pt was sleeping comfortably upon entering the room. I awoke the patient, and he has no concerns at this time.    O: BP (!) 141/68 (BP Location: Left Arm)   Pulse 92   Temp 98.8 F (37.1 C) (Oral)   Resp 17   Ht 6' (1.829 m)   Wt 90.7 kg   SpO2 95%   BMI 27.12 kg/m   Constitutional: NAD, sleeping comfortably in bed  Resp: Normal WOB Psych: Normal mood and behavior   A/P: Plan per day team  - Orders reviewed. Labs for AM ordered, which was adjusted as needed.  - Voiding trial successful, foley d/c'ed  -VSS, BP: 141/68  Erskine Emery, MD 10/06/2020, 11:40 PM PGY-1, [facility] Night Resident  Please page [phone] with questions.

## 2020-10-06 NOTE — Hospital Course (Addendum)
Altered mental status likely secondary to urospesis Patient appeared delirious on admission with severe attentional deficit. ED course notable for a lactate of 5.0, UA with nitrites, and leukocytosis to 21 with left shift, ANC 18.7. Patient started on Vanc/Zosyn and given 57m/kg fluid resuscitation. UDS negative, EtOH negative. CT head non contrast with likely old infarct of right MCA. By the next day, the patient exhibited markedly improved mentation. Ucx came back with 100K CFU of pan-sensitive E. Coli. Bcx were negative. His IV ABX were switched to oral cefdinir (9/4-9/5). The ABX were then narrowed to oral cefadroxil for a 4 week course given concern for prostatitis (see below).    Urinary retention Patient with reported pre-hospital urinary hesitancy. Significant difficulty voiding in the ED. Not on an alpha-blocker. Foley placed. Failed voiding trial. Re-placed and started on flomax 0.4 mg daily. CT A/P showing evidence of prostatitis (mesorectal fat stranding). Not on an alpha blocker. Concern for acute/chronic prostatitis, given present admission for urosepsis. He declined several prostate exams. Outpatient follow up was arranged for the patient with [facility].   Diarrhea Patient with large volume watery diarrhea at one point. Resolved with addition of metamucil.    Thrombocytopenia Plts from 148->112->109->129->135 day of D/C. Uncertain etiology, unlikely due to Lovenox. May be hemodilution.    Anemia Hgb 12.5->11.8->10.8->10.7-> 10.6->10.8. Barely microcytic MCV 79. Elevated ferritin at 460. B12 low at 167. Folate nml. Started and continued B12 supplementation   History of stroke  PAD Previous right MCA stroke noted on imaging, verified by patient. Neurology initially consulted, deemed to be due to old stroke rather than current. Home med includes cilostazol 100 mg bid. Continued during stay.   First Degree AV Block PR interval 228 ms, noted on recent PSG in July 2022. Dr.  TRadford Pax cardiology, recommended zio patch outpatient. Patient to follow up with outpatient cardiologist, Dr. CBurt Knack    HLD Home medication simvastatin, lipid panel unremarkable. Continued Simvastatin 40 mg daily   Elevated blood pressure Noted to have elevated blood pressures on multiple occasions during this hospitalization. No home medications. We will defer management to his outpatient provider.    OSA Does not use CPAP regularly at home. He was not given CPAP during this hospitalization.  DM A1c 7.2 on admission, Glucose mid to high 100's throughout admission. Metformin was held given previous lactic acidosis and diarrhea.Monitored with daily CBGs. No medications added.  Recommendations Consider addition of a 5-alpha-reductase inhibitor for BPH Repeat iron studies and ferritin outpatient once infection cleared. Given concern for prostatitis, consider prostate exam, patient declined on multiple occasions during this hospitalization.

## 2020-10-06 NOTE — Progress Notes (Signed)
FPTS Interim Night Progress Note  S:Patient sleeping comfortably.  Rounded with primary night RN.  No concerns voiced.  No orders required.    O: Today's Vitals   10/05/20 1539 10/05/20 1636 10/05/20 1800 10/05/20 2037  BP: (!) 150/83   (!) 162/75  Pulse: 85   69  Resp: 18   18  Temp: 99.9 F (37.7 C)  99.9 F (37.7 C) 97.9 F (36.6 C)  TempSrc: Oral  Oral Oral  SpO2: 97%   99%  Weight:      Height:      PainSc: 0-No pain 4  0-No pain       A/P: Lactic Acid 1.8 this am. Continue IVF '@125'$ /h Continue current management  Carollee Leitz MD PGY-3, [place] Medicine Service pager [phone]

## 2020-10-06 NOTE — Progress Notes (Signed)
Dajane Valli LPN, discontinued foley at 1130. Pt due to void at 1730.

## 2020-10-07 DIAGNOSIS — R338 Other retention of urine: Secondary | ICD-10-CM | POA: Diagnosis not present

## 2020-10-07 DIAGNOSIS — A4151 Sepsis due to Escherichia coli [E. coli]: Secondary | ICD-10-CM | POA: Diagnosis not present

## 2020-10-07 LAB — CBC WITH DIFFERENTIAL/PLATELET
Abs Immature Granulocytes: 0.05 10*3/uL (ref 0.00–0.07)
Basophils Absolute: 0.1 10*3/uL (ref 0.0–0.1)
Basophils Relative: 1 %
Eosinophils Absolute: 0.1 10*3/uL (ref 0.0–0.5)
Eosinophils Relative: 1 %
HCT: 29.8 % — ABNORMAL LOW (ref 39.0–52.0)
Hemoglobin: 10.8 g/dL — ABNORMAL LOW (ref 13.0–17.0)
Immature Granulocytes: 1 %
Lymphocytes Relative: 11 %
Lymphs Abs: 0.8 10*3/uL (ref 0.7–4.0)
MCH: 28.5 pg (ref 26.0–34.0)
MCHC: 36.2 g/dL — ABNORMAL HIGH (ref 30.0–36.0)
MCV: 78.6 fL — ABNORMAL LOW (ref 80.0–100.0)
Monocytes Absolute: 0.4 10*3/uL (ref 0.1–1.0)
Monocytes Relative: 6 %
Neutro Abs: 5.8 10*3/uL (ref 1.7–7.7)
Neutrophils Relative %: 80 %
Platelets: 109 10*3/uL — ABNORMAL LOW (ref 150–400)
RBC: 3.79 MIL/uL — ABNORMAL LOW (ref 4.22–5.81)
RDW: 16 % — ABNORMAL HIGH (ref 11.5–15.5)
WBC: 7.2 10*3/uL (ref 4.0–10.5)
nRBC: 0 % (ref 0.0–0.2)

## 2020-10-07 LAB — FERRITIN: Ferritin: 460 ng/mL — ABNORMAL HIGH (ref 24–336)

## 2020-10-07 LAB — FOLATE: Folate: 14.7 ng/mL (ref >5.9)

## 2020-10-07 LAB — GLUCOSE, CAPILLARY: Glucose-Capillary: 177 mg/dL — ABNORMAL HIGH (ref 70–99)

## 2020-10-07 LAB — VITAMIN B12: Vitamin B-12: 167 pg/mL — ABNORMAL LOW (ref 180–914)

## 2020-10-07 LAB — SAVE SMEAR(SSMR), FOR PROVIDER SLIDE REVIEW

## 2020-10-07 MED ORDER — TAMSULOSIN HCL 0.4 MG PO CAPS
0.4000 mg | ORAL_CAPSULE | Freq: Every day | ORAL | Status: DC
  Administered 2020-10-07 – 2020-10-11 (×5): 0.4 mg via ORAL
  Filled 2020-10-07 (×5): qty 1

## 2020-10-07 NOTE — Evaluation (Signed)
Occupational Therapy Evaluation Patient Details Name: Ricky [name]. MRN: [id number] DOB: January 19, 1936 Today's Date: 10/07/2020    History of Present Illness Pt is an 85yo male presenting to [facility] ED on 9/2 after family found him on the floor, pt does not remember falling. CT with no acute findings. Thought to have sepsis possibly from urinary source. PMH: HTN, OSA, history of CVA.   Clinical Impression   Patient admitted for the diagnosis above, possibly discharging today.  Patient presents with mild unsteadiness and safety concerns which could increase his fall risk.  Given prior level, and above deficits, OT to follow in the acute setting to maximize his functional status.  HH OT post acute may be beneficial if he agrees.      Follow Up Recommendations  Home health OT    Equipment Recommendations  Tub/shower seat    Recommendations for Other Services       Precautions / Restrictions Precautions Precautions: Fall Restrictions Weight Bearing Restrictions: No      Mobility Bed Mobility Overal bed mobility: Needs Assistance Bed Mobility: Supine to Sit     Supine to sit: Supervision       Patient Response: Cooperative  Transfers   Equipment used: Rolling walker (2 wheeled) Transfers: Sit to/from [facility] Sit to Stand: Supervision Stand pivot transfers: Supervision       General transfer comment: decreased safety    Balance Overall balance assessment: Needs assistance Sitting-balance support: Feet supported;No upper extremity supported Sitting balance-Leahy Scale: Good     Standing balance support: Bilateral upper extremity supported;During functional activity Standing balance-Leahy Scale: Poor Standing balance comment: Pt reliant on BUE on RW during static stance and mobility tasks.                           ADL either performed or assessed with clinical judgement   ADL               Lower Body Bathing: Supervison/  safety;Sit to/from stand       Lower Body Dressing: Supervision/safety;Sit to/from stand   Toilet Transfer: Supervision/safety   Toileting- Water quality scientist and Hygiene: Supervision/safety               Vision Patient Visual Report: No change from baseline       Perception     Praxis      Pertinent Vitals/Pain Pain Assessment: No/denies pain Pain Intervention(s): Monitored during session     Hand Dominance Right   Extremity/Trunk Assessment Upper Extremity Assessment Upper Extremity Assessment: Overall WFL for tasks assessed   Lower Extremity Assessment Lower Extremity Assessment: Defer to PT evaluation   Cervical / Trunk Assessment Cervical / Trunk Assessment: Normal   Communication Communication Communication: HOH   Cognition Arousal/Alertness: Awake/alert Behavior During Therapy: WFL for tasks assessed/performed Overall Cognitive Status: Within Functional Limits for tasks assessed                                 General Comments: mild safety deificts noted with urgent bathroom use   General Comments       Exercises     Shoulder Instructions      Home Living Family/patient expects to be discharged to:: Private residence Living Arrangements: Spouse/significant other Available Help at Discharge: Family;Available 24 hours/day Type of Home: House Home Access: Stairs to enter [employer] of Steps: 3 Entrance Stairs-Rails: Right;Left Home  Layout: One level     Bathroom Shower/Tub: Teacher, early years/pre: Standard     Home Equipment: Cane - single point;Shower seat          Prior Functioning/Environment Level of Independence: Needs assistance  Gait / Transfers Assistance Needed: Independent with gait, has used cane in the past when feeling unsteady. ADL's / Homemaking Assistance Needed: Wife helps with medication management and cooking. Independent with other ADLs            OT Problem List:  Decreased activity tolerance;Impaired balance (sitting and/or standing);Decreased safety awareness      OT Treatment/Interventions: Self-care/ADL training;DME and/or AE instruction;Balance training;Therapeutic activities    OT Goals(Current goals can be found in the care plan section) Acute Rehab OT Goals Patient Stated Goal: I'm ok OT Goal Formulation: With patient Time For Goal Achievement: 10/21/20 Potential to Achieve Goals: Good ADL Goals Pt Will Perform Grooming: with modified independence;sitting;standing Pt Will Perform Lower Body Bathing: with modified independence;sit to/from stand Pt Will Perform Lower Body Dressing: with modified independence;sit to/from stand Pt Will Transfer to Toilet: with modified independence;ambulating Pt Will Perform Toileting - Clothing Manipulation and hygiene: with modified independence;sit to/from stand  OT Frequency: Min 2X/week   Barriers to D/C:    none noted       Co-evaluation              AM-PAC OT "6 Clicks" Daily Activity     Outcome Measure Help from another person eating meals?: None Help from another person taking care of personal grooming?: A Little Help from another person toileting, which includes using toliet, bedpan, or urinal?: A Little Help from another person bathing (including washing, rinsing, drying)?: A Little Help from another person to put on and taking off regular upper body clothing?: A Little Help from another person to put on and taking off regular lower body clothing?: A Little 6 Click Score: 19   End of Session Equipment Utilized During Treatment: Rolling walker Nurse Communication: Mobility status  Activity Tolerance: Patient tolerated treatment well Patient left: in chair;with call bell/phone within reach;with chair alarm set  OT Visit Diagnosis: Unsteadiness on feet (R26.81)                Time: [id number] OT Time Calculation (min): 18 min Charges:  OT General Charges $OT Visit: 1 Visit OT  Evaluation $OT Eval Moderate Complexity: 1 Mod  10/07/2020  RP, OTR/L  Acute Rehabilitation Services  Office:  [phone]   Metta Clines 10/07/2020, 8:31 AM

## 2020-10-07 NOTE — NC FL2 (Signed)
[place] LEVEL OF CARE SCREENING TOOL     IDENTIFICATION  Patient Name: Ricky [name]. Birthdate: August 01, 1935 Sex: male Admission Date (Current Location): 10/04/2020  [facility] and [place] Number:  Herbalist and Address:  [facility] [place] [place] [address], [place], [place] [address]      Provider Number: [id number]  Attending Physician Name and Address:  Lenoria Chime, MD  Relative Name and Phone Number:  [place], [place] [phone]    Current Level of Care: Hospital Recommended Level of Care: [place] Prior Approval Number:    Date Approved/Denied:   PASRR Number: [id number]  Discharge Plan: SNF    Current Diagnoses: Patient Active Problem List   Diagnosis Date Noted   Sepsis due to Escherichia coli (E. 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   Lactic acidosis    Hypotension    HLD (hyperlipidemia)    SIRS (systemic inflammatory response syndrome) ([place])    AKI (acute kidney injury) ([place]) 07/03/2015   Sepsis ([place]) 07/03/2015   Acute encephalopathy 07/03/2015   Dehydration    Diarrhea    ATHEROSCLEROSIS W /INT CLAUDICATION 04/14/2010   UNSPECIFIED PERIPHERAL VASCULAR DISEASE 04/01/2010    Orientation RESPIRATION BLADDER Height & Weight     Self, Time, Place  Normal Continent Weight: 200 lb (90.7 kg) Height:  6' (182.9 cm)  BEHAVIORAL SYMPTOMS/MOOD NEUROLOGICAL BOWEL NUTRITION STATUS      Incontinent Diet (see discharge summary)  AMBULATORY STATUS COMMUNICATION OF NEEDS Skin   Independent Verbally Normal                       Personal Care Assistance Level of Assistance  Bathing, Feeding, Dressing Bathing Assistance: Independent Feeding  assistance: Independent Dressing Assistance: Independent     Functional Limitations Info  Sight, Hearing, Speech Sight Info: Adequate Hearing Info: Adequate Speech Info: Adequate    SPECIAL CARE FACTORS FREQUENCY  PT (By licensed PT), OT (By licensed OT)     PT Frequency: 5x week OT Frequency: 5x week            Contractures Contractures Info: Not present    Additional Factors Info  Code Status, Allergies Code Status Info: full Allergies Info: aspirin           Current Medications (10/07/2020):  This is the current hospital active medication list Current Facility-Administered Medications  Medication Dose Route Frequency Provider Last Rate Last Admin   acetaminophen (TYLENOL) tablet 650 mg  650 mg Oral Q6H PRN Gladys Damme, MD   650 mg at 10/05/20 1633   Or   acetaminophen (TYLENOL) suppository 650 mg  650 mg Rectal Q6H PRN Gladys Damme, MD       cefdinir (OMNICEF) capsule 300 mg  300 mg Oral Q12H Gladys Damme, MD   300 mg at 10/07/20 [id number]   Chlorhexidine Gluconate Cloth 2 % PADS 6 each  6 each Topical Daily Lenoria Chime, MD   6 each at 10/06/20 0824   cilostazol (PLETAL) tablet 100 mg  100 mg Oral BID Erskine Emery, MD   100 mg at 10/07/20 0908   enoxaparin (LOVENOX) injection 40 mg  40 mg Subcutaneous Q24H Gladys Damme, MD   40  mg at 10/06/20 1639   simvastatin (ZOCOR) tablet 40 mg  40 mg Oral q1800 Corky Sox, MD   40 mg at 10/06/20 1639   tamsulosin (FLOMAX) capsule 0.4 mg  0.4 mg Oral Daily Gladys Damme, MD         Discharge Medications: Please see discharge summary for a list of discharge medications.  Relevant Imaging Results:  Relevant Lab Results:   Additional Information SSN: [id number].  Pt is vaccinated for covid with one booster.  Joanne Chars, LCSW

## 2020-10-07 NOTE — Progress Notes (Signed)
FPTS Brief Progress Note  S: Pt is sleeping comfortably in bed, and I didn't wake the pt.    O: BP 125/69 (BP Location: Left Arm)   Pulse 73   Temp 99 F (37.2 C) (Oral)   Resp 16   Ht 6' (1.829 m)   Wt 90.7 kg   SpO2 96%   BMI 27.12 kg/m   General: NAD, sleeping comfortably in bed Respiratory: No respiratory distress   A/P: Plan per day team  - Orders reviewed. Labs for AM ordered, which was adjusted as needed.  - Foley replaced with day team and flomax started  - PO cefdinir, afebrile and VSS - awaiting C Diff due to large volume watery diarrhea   Erskine Emery, MD 10/07/2020, 8:40 PM PGY-1, [place] Night Resident  Please page [phone] with questions.

## 2020-10-07 NOTE — Progress Notes (Signed)
Family Medicine Teaching Service Daily Progress Note Intern Pager: [phone]  Patient name: Ricky [name]. Medical record number: [id number] Date of birth: 1935-03-11 Age: 85 y.o. Gender: male  Primary Care Provider: [name], MD Consultants: none Code Status: FULL  Pt Overview and Major Events to Date:  9/2: admitted, AMS 9/3: Ucx with 100K CFU of E.Coli, pan-susceptible; switched from Zosyn to cefdinir, improved mental status  Assessment and Plan:  Urinary retention Patient with current difficulty voiding without a foley. Exam notable for suprapubic fullness and lower ABD tenderness. Bladder scan showing PVR of 500 mL. -Re-place foley, will continue until f/u with outpatient Urology -Start Flomax 0.4 mg daily  Diarrhea Patient with large volume watery diarrhea this AM, in the context of recent IV ABX use (Vanc/Zosyn) and oral cefdinir.  -Call ID to obtain C. Diff antigen -CTM -Check BMP and CBC tomorrow  Sepsis 2/2 E. Coli UTI vs prostatitis  Urine Cx positive for 100K CFU E. Coli, pan-susceptible. Off IV ABX.  -Continue PO cefdinir for 14 d course ([id number])  Acute metabolic encephalopathy, resolved Patient's sister reports patient is at baseline. Alert and oriented. However, concern for patient returning home with functionally impaired wife. -PT to reassess patient's dispo, may need SNF -Repeat CAM testing  Thrombocytopenia Plts trending down from 148->112->109. Uncertain etiology, unlikely due to Lovenox. May be hemodilution.  -Peripheral smear -CBC for trending  Anemia Hgb trending down 12.5->11.8->10.8, barely microcytic MCV 79. Possible IDA or hemodilution.  -Iron studies -B12 -Folate  DM A1c 7.2 on admission, Glucose mid to high 100's throughout admission thus far.  -hold metformin given previous lactic acidosis -Will monitor with daily AM CBG, elevated at 177 this AM. Will CTM.   History of stroke  PAD Previous right MCA stroke noted on imaging,  verified by patient. Neurology initially consulted, deemed to be due to old stroke rather than current. Home med includes cilostazol 100 mg bid.  -Restarted cilostazol   First Degree AV Block PR interval 228 ms, noted on recent PSG in July 2022. Dr. Radford Pax, cardiology, recommended zio patch outpatient. -follow up cardiology outpatient for possible op zio patch -monitor EKG as appropriate or if any changes noted    HLD Home medication simvastatin, lipid panel unremarkable. -continue Simvastatin 40 mg daily   Elevated blood pressure Noted to have elevated blood pressures on multiple occasions during this hospitalization. No home medications.  -continue to monitor -Defer management to outpatient provider   OSA Does not use CPAP regularly at home. -No intervention needed  FEN/GI: PO fluids, regular diet PPx: Lovenox Dispo:TBD, pending PT recs  Subjective:  Patient alert and oriented this AM. Experiencing significant urinary hesitancy, unable to void. PVR of 500 mL. Large volume watery diarrhea this AM. ROS negative for CP, SoB, N/V.  Objective: Temp:  [98.8 F (37.1 C)-98.9 F (37.2 C)] 98.9 F (37.2 C) (09/05 0401) Pulse Rate:  [78-92] 78 (09/05 0401) Resp:  [17-18] 18 (09/05 0401) BP: (141-143)/(68-70) 143/70 (09/05 0401) SpO2:  [95 %-96 %] 96 % (09/05 0401) Physical Exam Vitals reviewed.  Cardiovascular:     Rate and Rhythm: Normal rate and regular rhythm.  Pulmonary:     Effort: Pulmonary effort is normal.     Breath sounds: Normal breath sounds.  Abdominal:     General: Bowel sounds are normal.     Palpations: Abdomen is soft.     Tenderness: There is abdominal tenderness.     Comments: Suprapubic distention noted      Laboratory:  Recent Labs  Lab 10/05/20 0357 10/06/20 0101 10/07/20 0130  WBC 21.7* 15.2* 7.2  HGB 12.5* 11.8* 10.8*  HCT 35.5* 33.1* 29.8*  PLT 141* 112* 109*   Recent Labs  Lab 10/04/20 1123 10/05/20 0021 10/05/20 0357 10/06/20 0101   NA 136 135 137 136  K 4.2 4.6 4.1 3.6  CL 101 103 105 108  CO2 22 19* 23 24  BUN '9 10 9 9  '$ CREATININE 1.06 1.12 1.14 1.06  CALCIUM 9.3 9.1 8.8* 8.4*  PROT 7.8  --   --   --   BILITOT 1.4*  --   --   --   ALKPHOS 69  --   --   --   ALT 20  --   --   --   AST 29  --   --   --   GLUCOSE 193* 184* 183* 169*      Imaging/Diagnostic Tests: Bladder scan   Corky Sox PGY-1, Psychiatry FPTS Intern pager: [phone], text pages welcome

## 2020-10-07 NOTE — TOC Initial Note (Signed)
Transition of Care (TOC) - Initial/Assessment Note    Patient Details  Name: Ricky [name]. MRN: [id number] Date of Birth: 11/14/1935  Transition of [facility]) CM/SW Contact:    Joanne Chars, LCSW Phone Number: 10/07/2020, 10:46 AM  Clinical Narrative:  CSW met with pt and sister Mardene Celeste regarding recommendation for [facility].  Permission given to speak with sister present and also to speak with son Travontae.  CSW discussed recommendation, sister reports concerns that pt does not have support in the home.  Pt lives with wife who requires her own assistance which pt typically provides.  Wife is not able to provide assistance to pt and sister and son are not available full time.  Pt agrees with the above assessment, willing to go to SNF, permission to send referral out in hub given.  CSW asked who is staying with wife?  Sister not sure, has not been there or communicated with son but assumes son is doing so.  She will check on wife today.  PCP in place.  Current DME in home: walker, shower chair.  Pt is vaccinated for covid with one booster.  CSW messaged with PT who will reevaluate tomorrow for SNF. PT score AM PAC is 18,  CSW messaged with leadership regarding review of this pt due to possible difficulty with insurance auth.                   Expected Discharge Plan: Skilled Nursing Facility Barriers to Discharge: Continued Medical Work up, SNF Pending bed offer   Patient Goals and CMS Choice   CMS Medicare.gov Compare Post Acute Care list provided to:: Patient Represenative (must comment) Choice offered to / list presented to : Sibling  Expected Discharge Plan and Services Expected Discharge Plan: [place] Choice: [place] arrangements for the past 2 months: Single Family Home                                      Prior Living Arrangements/Services Living arrangements for the past 2 months: Single Family Home Lives  with:: Spouse Patient language and need for interpreter reviewed:: Yes Do you feel safe going back to the place where you live?: No   Pt is caregiver to his wife, no additional support in home  Need for Family Participation in Patient Care: Yes (Comment) Care giver support system in place?: Yes (comment) Current home services: Other (comment) (na) Criminal Activity/Legal Involvement Pertinent to Current Situation/Hospitalization: No - Comment as needed  Activities of Daily Living Home Assistive Devices/Equipment: None ADL Screening (condition at time of admission) Patient's cognitive ability adequate to safely complete daily activities?: No Is the patient deaf or have difficulty hearing?: No Does the patient have difficulty seeing, even when wearing glasses/contacts?: No Does the patient have difficulty concentrating, remembering, or making decisions?: Yes Patient able to express need for assistance with ADLs?: No Does the patient have difficulty dressing or bathing?: Yes Independently performs ADLs?: No Communication: Dependent Is this a change from baseline?: Pre-admission baseline Dressing (OT): Dependent Is this a change from baseline?: Pre-admission baseline Grooming: Dependent Is this a change from baseline?: Pre-admission baseline Feeding: Dependent Is this a change from baseline?: Pre-admission baseline Bathing: Dependent Is this a change from baseline?: Pre-admission baseline Toileting: Dependent Is this a change from baseline?: Pre-admission baseline In/Out Bed: Dependent Is this a change  from baseline?: Pre-admission baseline Walks in Home: Dependent Is this a change from baseline?: Pre-admission baseline Does the patient have difficulty walking or climbing stairs?: Yes Weakness of Legs: Both Weakness of Arms/Hands: Both  Permission Sought/Granted Permission sought to share information with : Family Supports Permission granted to share information with : Yes, Verbal  Permission Granted  Share Information with NAME: sister Mardene Celeste, son Christerpher  Permission granted to share info w AGENCY: SNF        Emotional Assessment Appearance:: Appears stated age Attitude/Demeanor/Rapport: Engaged Affect (typically observed): Appropriate, Pleasant Orientation: : Oriented to Self, Oriented to Place, Oriented to  Time Alcohol / Substance Use: Not Applicable Psych Involvement: No (comment)  Admission diagnosis:  Mixed hyperlipidemia [E78.2] Lower urinary tract infectious disease [N39.0] Cerebrovascular disease [I67.9] Sepsis ([place]) [A41.9] Sepsis with encephalopathy without septic shock, due to unspecified organism ([place]) [A41.9, R65.20, G93.40] Patient Active Problem List   Diagnosis Date Noted   Sepsis due to Escherichia coli (E. 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   Lactic acidosis    Hypotension    HLD (hyperlipidemia)    SIRS (systemic inflammatory response syndrome) ([place])    AKI (acute kidney injury) ([place]) 07/03/2015   Sepsis ([place]) 07/03/2015   Acute encephalopathy 07/03/2015   Dehydration    Diarrhea    ATHEROSCLEROSIS W /INT CLAUDICATION 04/14/2010   UNSPECIFIED PERIPHERAL VASCULAR DISEASE 04/01/2010   PCP:  [name], MD Pharmacy:   [id number] [place] [place]Phone: [phone]: [phone] [place]Phone: [phone]: [phone]    Social Determinants of Health (SYoung Interventions    Readmission Risk Interventions No flowsheet data found.

## 2020-10-08 DIAGNOSIS — A4151 Sepsis due to Escherichia coli [E. coli]: Secondary | ICD-10-CM | POA: Diagnosis not present

## 2020-10-08 LAB — CBC WITH DIFFERENTIAL/PLATELET
Abs Immature Granulocytes: 0.06 10*3/uL (ref 0.00–0.07)
Basophils Absolute: 0.1 10*3/uL (ref 0.0–0.1)
Basophils Relative: 1 %
Eosinophils Absolute: 0.1 10*3/uL (ref 0.0–0.5)
Eosinophils Relative: 2 %
HCT: 28.9 % — ABNORMAL LOW (ref 39.0–52.0)
Hemoglobin: 10.7 g/dL — ABNORMAL LOW (ref 13.0–17.0)
Immature Granulocytes: 1 %
Lymphocytes Relative: 20 %
Lymphs Abs: 1.2 10*3/uL (ref 0.7–4.0)
MCH: 28.8 pg (ref 26.0–34.0)
MCHC: 37 g/dL — ABNORMAL HIGH (ref 30.0–36.0)
MCV: 77.9 fL — ABNORMAL LOW (ref 80.0–100.0)
Monocytes Absolute: 0.7 10*3/uL (ref 0.1–1.0)
Monocytes Relative: 13 %
Neutro Abs: 3.6 10*3/uL (ref 1.7–7.7)
Neutrophils Relative %: 63 %
Platelets: 129 10*3/uL — ABNORMAL LOW (ref 150–400)
RBC: 3.71 MIL/uL — ABNORMAL LOW (ref 4.22–5.81)
RDW: 16.2 % — ABNORMAL HIGH (ref 11.5–15.5)
WBC: 5.7 10*3/uL (ref 4.0–10.5)
nRBC: 0 % (ref 0.0–0.2)

## 2020-10-08 LAB — BASIC METABOLIC PANEL
Anion gap: 10 (ref 5–15)
BUN: 10 mg/dL (ref 8–23)
CO2: 17 mmol/L — ABNORMAL LOW (ref 22–32)
Calcium: 8.3 mg/dL — ABNORMAL LOW (ref 8.9–10.3)
Chloride: 110 mmol/L (ref 98–111)
Creatinine, Ser: 1.15 mg/dL (ref 0.61–1.24)
GFR, Estimated: >60 mL/min (ref >60)
Glucose, Bld: 233 mg/dL — ABNORMAL HIGH (ref 70–99)
Potassium: 3.3 mmol/L — ABNORMAL LOW (ref 3.5–5.1)
Sodium: 137 mmol/L (ref 135–145)

## 2020-10-08 LAB — GLUCOSE, CAPILLARY: Glucose-Capillary: 184 mg/dL — ABNORMAL HIGH (ref 70–99)

## 2020-10-08 MED ORDER — POTASSIUM CHLORIDE CRYS ER 20 MEQ PO TBCR
40.0000 meq | EXTENDED_RELEASE_TABLET | Freq: Once | ORAL | Status: AC
  Administered 2020-10-08: 40 meq via ORAL
  Filled 2020-10-08: qty 2

## 2020-10-08 MED ORDER — CEFADROXIL 500 MG PO CAPS
1000.0000 mg | ORAL_CAPSULE | Freq: Two times a day (BID) | ORAL | Status: DC
  Administered 2020-10-08 – 2020-10-11 (×7): 1000 mg via ORAL
  Filled 2020-10-08 (×8): qty 2

## 2020-10-08 MED ORDER — VITAMIN B-12 100 MCG PO TABS
100.0000 ug | ORAL_TABLET | Freq: Every day | ORAL | Status: DC
  Administered 2020-10-08 – 2020-10-11 (×4): 100 ug via ORAL
  Filled 2020-10-08 (×5): qty 1

## 2020-10-08 NOTE — Progress Notes (Signed)
Family Medicine Teaching Service Daily Progress Note Intern Pager: [phone]  Patient name: Ricky [name]. Medical record number: [id number] Date of birth: Jan 03, 1936 Age: 85 y.o. Gender: male  Primary Care Provider: [name], MD Consultants: none Code Status: FULL  Pt Overview and Major Events to Date:  9/2: admitted 9/3: Urine culture with 100,000 CFU of E. coli, pan susceptible; switched from Zosyn to cefdinir, improved mental status 9/5: failed voiding trial, foley re-placed 9/6: switch Cefdinir to cefadroxil  Assessment and Plan:  Urinary retention Patient with pre-hospital frequency and hesitancy.  Likely BPH, untreated. Failed voiding trial. Foley in. UOP 1L past 24hrs. Continued suprapubic fullness. -Bladder scan to rule out kinked foley -Continue foley, will continue until f/u with outpatient Urology -call to make appt with Urology today -Start Flomax 0.4 mg daily   Diarrhea Patient with watery diarrhea yesterday and previous loose stools. Concern for C. Diff given prev IV ABX and current course of cefdinir. Patient reports diarrhea this AM, per nursing patient has not had any diarrhea. C. Diff has not been collected yet. No reports of large volume diarrhea.  -f/u C. Diff antigen -CTM   Sepsis 2/2 E. Coli UTI vs prostatitis  Urine Cx positive for 100K CFU E. Coli, pan-susceptible. Off IV ABX. Received cefdinir from 9/4 to 9/6.  CT abdomen pelvis notable for mesial rectal fat stranding, concerning for prostatitis.  Patient has declined prostate exam several times. -switch to cefadroxil 1g BID for 4 week course, given concern for prostatitis and need to narrow ABX  Acute metabolic encephalopathy, resolved Patient's sister reports patient is at baseline. Alert and oriented. However, concern for patient returning home with functionally impaired wife. Unable to complete SAVEAHEART testing this AM.  -PT to reassess patient's dispo today, may need SNF -Repeat CAM testing    Thrombocytopenia Plts from [id number] this AM. Uncertain etiology, unlikely due to Lovenox. May be hemodilution.  -Peripheral smear pending   Anemia Hgb was trending down 12.5->11.8->10.8, now stable at 10.7 this AM. Barely microcytic MCV 79. Elevated ferritin at 460. B12 low at 167. Folate nml. -Start B12 supplementation   DM A1c 7.2 on admission, Glucose mid to high 100's throughout admission thus far.  -hold metformin given previous lactic acidosis -Will monitor with daily AM CBG (not collected this AM, re-ordered).   History of stroke  PAD Previous right MCA stroke noted on imaging, verified by patient. Neurology initially consulted, deemed to be due to old stroke rather than current. Home med includes cilostazol 100 mg bid.  -Restarted cilostazol   First Degree AV Block PR interval 228 ms, noted on recent PSG in July 2022. Dr. Radford Pax, cardiology, recommended zio patch outpatient. -follow up cardiology outpatient for possible op zio patch -monitor EKG as appropriate or if any changes noted    HLD Home medication simvastatin, lipid panel unremarkable. -continue Simvastatin 40 mg daily   Elevated blood pressure Noted to have elevated blood pressures on multiple occasions during this hospitalization. No home medications.  -continue to monitor -Defer management to outpatient provider   OSA Does not use CPAP regularly at home. -No intervention needed   FEN/GI: PO fluids, regular diet PPx: Lovenox Dispo:TBD by PT today  Subjective:  On assessment this morning, patient is fully alert and oriented. Persistent attentional deficit on CAM testing.  Patient reports an episode of diarrhea this morning, unconfirmed by nursing staff.  Review of systems is negative for chest pain, shortness of breath, nausea or vomiting.  Objective: Temp:  [98.6 F (  37 C)-99.3 F (37.4 C)] 99.3 F (37.4 C) (09/06 0344) Pulse Rate:  [73-77] 73 (09/06 0344) Resp:  [16-18] 18 (09/06  0344) BP: (125-153)/(69-70) 148/70 (09/06 0344) SpO2:  [98 %-99 %] 99 % (09/06 0344) Physical Exam Vitals reviewed.  Cardiovascular:     Rate and Rhythm: Normal rate and regular rhythm.  Pulmonary:     Effort: Pulmonary effort is normal.     Breath sounds: Normal breath sounds.  Abdominal:     General: Bowel sounds are normal.     Palpations: Abdomen is soft.     Tenderness: There is no abdominal tenderness.     Comments: suprapubic fullness noted  Neurological:     Mental Status: He is alert and oriented to person, place, and time.     Laboratory: Recent Labs  Lab 10/06/20 0101 10/07/20 0130 10/08/20 0541  WBC 15.2* 7.2 5.7  HGB 11.8* 10.8* 10.7*  HCT 33.1* 29.8* 28.9*  PLT 112* 109* 129*   Recent Labs  Lab 10/04/20 1123 10/05/20 0021 10/05/20 0357 10/06/20 0101 10/08/20 0107  NA 136   < > 137 136 137  K 4.2   < > 4.1 3.6 3.3*  CL 101   < > 105 108 110  CO2 22   < > 23 24 17*  BUN 9   < > '9 9 10  '$ CREATININE 1.06   < > 1.14 1.06 1.15  CALCIUM 9.3   < > 8.8* 8.4* 8.3*  PROT 7.8  --   --   --   --   BILITOT 1.4*  --   --   --   --   ALKPHOS 69  --   --   --   --   ALT 20  --   --   --   --   AST 29  --   --   --   --   GLUCOSE 193*   < > 183* 169* 233*   < > = values in this interval not displayed.    Imaging/Diagnostic Tests: CBC and anemia studies as above.   Corky Sox PGY-1, Psychiatry [place] Intern pager: [phone], text pages welcome

## 2020-10-08 NOTE — Progress Notes (Signed)
Physical Therapy Treatment Patient Details Name: Ricky [name]. MRN: [id number] DOB: 1935-10-24 Today's Date: 10/08/2020    History of Present Illness Pt is an 85yo male presenting to [facility] ED on 9/2 after family found him on the floor, pt does not remember falling. CT with no acute findings. Thought to have sepsis possibly from urinary source. PMH: HTN, OSA, history of CVA.    PT Comments    Pt tolerated treatment well with pt's sister present intermittently during session. Pt performed bed mobility and transfers with supervision, demonstrating incontinence upon sitting EOB and standing. Pt increased ambulation distance in room with RW compared to previous session, requiring min guard for safety. Updated d/c recommendation to SNF given decreased caregiver support at home. Pt agreeable to recommendation.    Follow Up Recommendations  SNF     Equipment Recommendations  3in1 (PT);Rolling walker with 5" wheels    Recommendations for Other Services       Precautions / Restrictions Precautions Precautions: Fall Precaution Comments: Incontinence Restrictions Weight Bearing Restrictions: No    Mobility  Bed Mobility Overal bed mobility: Needs Assistance Bed Mobility: Supine to Sit     Supine to sit: Supervision;HOB elevated     General bed mobility comments: HOB 30 degrees with assist from bedrails    Transfers Overall transfer level: Needs assistance Equipment used: Rolling walker (2 wheeled) Transfers: Sit to/from Stand Sit to Stand: Supervision         General transfer comment: Supervision for safety. Incontinent bowel when standing.  Ambulation/Gait Ambulation/Gait assistance: Min guard Gait Distance (Feet): 60 Feet Assistive device: Rolling walker (2 wheeled) Gait Pattern/deviations: Step-through pattern;Decreased stride length Gait velocity: Decreased Gait velocity interpretation: 1.31 - 2.62 ft/sec, indicative of limited community ambulator General Gait  Details: Pt ambulated to bathroom then completed 4 laps in room to recliner.   Stairs             Wheelchair Mobility    Modified Rankin (Stroke Patients Only)       Balance Overall balance assessment: Needs assistance Sitting-balance support: Feet supported;No upper extremity supported Sitting balance-Leahy Scale: Good     Standing balance support: Bilateral upper extremity supported;During functional activity Standing balance-Leahy Scale: Poor Standing balance comment: Pt reliant on BUE on RW during ambulation and static standing                            Cognition Arousal/Alertness: Awake/alert Behavior During Therapy: WFL for tasks assessed/performed Overall Cognitive Status: Within Functional Limits for tasks assessed                                        Exercises General Exercises - Lower Extremity Long Arc Quad: AROM;Both;10 reps;Seated Hip Flexion/Marching: AROM;Both;10 reps;Seated    General Comments General comments (skin integrity, edema, etc.): VSS on RA      Pertinent Vitals/Pain Pain Assessment: No/denies pain    Home Living                      Prior Function            PT Goals (current goals can now be found in the care plan section) Progress towards PT goals: Progressing toward goals    Frequency    Min 3X/week      PT Plan Discharge plan needs to be updated  Co-evaluation              AM-PAC PT "6 Clicks" Mobility   Outcome Measure  Help needed turning from your back to your side while in a flat bed without using bedrails?: A Little Help needed moving from lying on your back to sitting on the side of a flat bed without using bedrails?: A Little Help needed moving to and from a bed to a chair (including a wheelchair)?: A Little Help needed standing up from a chair using your arms (e.g., wheelchair or bedside chair)?: A Little Help needed to walk in hospital room?: A  Little Help needed climbing 3-5 steps with a railing? : A Lot 6 Click Score: 17    End of Session Equipment Utilized During Treatment: Gait belt Activity Tolerance: Patient tolerated treatment well Patient left: in chair;with call bell/phone within reach;with chair alarm set;with nursing/sitter in room Nurse Communication: Mobility status PT Visit Diagnosis: Difficulty in walking, not elsewhere classified (R26.2);History of falling (Z91.81);Muscle weakness (generalized) (M62.81)     Time: [id number] PT Time Calculation (min) (ACUTE ONLY): 30 min  Charges:  $Gait Training: 8-22 mins $Therapeutic Activity: 8-22 mins                     Louie Casa, SPT Acute Rehab: [phone] [id number]     Domingo Dimes 10/08/2020, 1:06 PM

## 2020-10-09 DIAGNOSIS — A4151 Sepsis due to Escherichia coli [E. coli]: Secondary | ICD-10-CM | POA: Diagnosis not present

## 2020-10-09 LAB — CBC
HCT: 29.4 % — ABNORMAL LOW (ref 39.0–52.0)
Hemoglobin: 10.6 g/dL — ABNORMAL LOW (ref 13.0–17.0)
MCH: 28.4 pg (ref 26.0–34.0)
MCHC: 36.1 g/dL — ABNORMAL HIGH (ref 30.0–36.0)
MCV: 78.8 fL — ABNORMAL LOW (ref 80.0–100.0)
Platelets: 135 10*3/uL — ABNORMAL LOW (ref 150–400)
RBC: 3.73 MIL/uL — ABNORMAL LOW (ref 4.22–5.81)
RDW: 16.2 % — ABNORMAL HIGH (ref 11.5–15.5)
WBC: 5.5 10*3/uL (ref 4.0–10.5)
nRBC: 0 % (ref 0.0–0.2)

## 2020-10-09 LAB — BASIC METABOLIC PANEL
Anion gap: 7 (ref 5–15)
BUN: 8 mg/dL (ref 8–23)
CO2: 22 mmol/L (ref 22–32)
Calcium: 8.2 mg/dL — ABNORMAL LOW (ref 8.9–10.3)
Chloride: 109 mmol/L (ref 98–111)
Creatinine, Ser: 1.09 mg/dL (ref 0.61–1.24)
GFR, Estimated: >60 mL/min (ref >60)
Glucose, Bld: 255 mg/dL — ABNORMAL HIGH (ref 70–99)
Potassium: 3.2 mmol/L — ABNORMAL LOW (ref 3.5–5.1)
Sodium: 138 mmol/L (ref 135–145)

## 2020-10-09 LAB — CULTURE, BLOOD (ROUTINE X 2)
Culture: NO GROWTH
Culture: NO GROWTH
Special Requests: ADEQUATE
Special Requests: ADEQUATE

## 2020-10-09 LAB — GLUCOSE, CAPILLARY: Glucose-Capillary: 238 mg/dL — ABNORMAL HIGH (ref 70–99)

## 2020-10-09 MED ORDER — PANTOPRAZOLE SODIUM 40 MG PO TBEC
40.0000 mg | DELAYED_RELEASE_TABLET | Freq: Every day | ORAL | Status: DC
  Administered 2020-10-09 – 2020-10-11 (×3): 40 mg via ORAL
  Filled 2020-10-09 (×3): qty 1

## 2020-10-09 MED ORDER — POTASSIUM CHLORIDE CRYS ER 20 MEQ PO TBCR
40.0000 meq | EXTENDED_RELEASE_TABLET | Freq: Once | ORAL | Status: AC
  Administered 2020-10-09: 40 meq via ORAL
  Filled 2020-10-09: qty 2

## 2020-10-09 MED ORDER — MONTELUKAST SODIUM 10 MG PO TABS
10.0000 mg | ORAL_TABLET | Freq: Every day | ORAL | Status: DC
  Administered 2020-10-09 – 2020-10-11 (×3): 10 mg via ORAL
  Filled 2020-10-09 (×3): qty 1

## 2020-10-09 MED ORDER — PSYLLIUM 95 % PO PACK
1.0000 | PACK | Freq: Every day | ORAL | Status: DC
  Administered 2020-10-10 – 2020-10-11 (×2): 1 via ORAL
  Filled 2020-10-09 (×4): qty 1

## 2020-10-09 NOTE — Progress Notes (Signed)
FPTS Brief Progress Note  S: Pt was sleeping well, and we did not wake the patient.   O: BP (!) 148/64 (BP Location: Right Arm)   Pulse 67   Temp 98.4 F (36.9 C) (Oral)   Resp 18   Ht 6' (1.829 m)   Wt 90.7 kg   SpO2 100%   BMI 27.12 kg/m   Constitutional: NAD, sleeping in bed  Resp: Normal WOB  A/P: Plan per day team  - Orders reviewed. Labs for AM ordered, which was adjusted as needed.    Erskine Emery, MD 10/09/2020, 10:28 PM PGY-1, [place] Medicine Night Resident  Please page [phone] with questions.

## 2020-10-09 NOTE — Progress Notes (Signed)
Family Medicine Teaching Service Daily Progress Note Intern Pager: [phone]  Patient name: Ricky [name]. Medical record number: [id number] Date of birth: Jun 18, 1935 Age: 85 y.o. Gender: male  Primary Care Provider: [name], MD Consultants: none Code Status: FULL  Pt Overview and Major Events to Date:  9/2: admitted 9/3: Urine culture with 100,000 CFU of E. coli, pan susceptible; switched from Zosyn to cefdinir, improved mental status 9/5: failed voiding trial, foley re-placed 9/6: switch Cefdinir to cefadroxil  Assessment and Plan:  Urinary retention Patient with pre-hospital frequency and hesitancy.  Likely BPH, untreated. Failed voiding trial. Foley in. UOP 1L past 24hrs. Patient denies suprapubic fullness and does not feel distended on exam.  -Continue foley, will continue until f/u with outpatient Urology -OP f/u arranged with [facility] -Continue Flomax 0.4 mg daily  Diarrhea Patient complaining of continued diarrhea, saying he has had 3 loose stools over the past day. Nursing discontinued C. Diff order (presumably due to inability to collect sample).  -No need to reorder C. Diff at this point, patient does not appear to have persistent large volume, watery diarrhea  -Add metamucil for bulking/fiber  Hypokalmemia K of 3.3 yesterday, replaced with 40 meq oral. 3.2 this AM, given 40 meq this AM. -Plan for another 40 meq at 11 AM -Recheck tomorrow AM  Sepsis 2/2 E. Coli UTI vs prostatitis  Urine Cx positive for 100K CFU E. Coli, pan-susceptible. Off IV ABX. Received cefdinir from 9/4 to 9/6.  CT abdomen pelvis notable for mesial rectal fat stranding, concerning for prostatitis.  Patient has declined prostate exam several times. -switch to cefadroxil 1g BID for 4 week course, given concern for prostatitis and need to narrow ABX   Acute metabolic encephalopathy, resolved Patient's sister reports patient is at baseline. Alert and oriented. However, concern for  patient returning home with functionally impaired wife. CAM negative this AM (first time).  -PT recommending SNF -Repeat CAM testing each morning   Thrombocytopenia Plts from [id number] this AM. Uncertain etiology, unlikely due to Lovenox. May be hemodilution.  -Peripheral smear pending   Anemia Hgb was trending down 12.5->11.8->10.8->10.7, now stable at 10.6 this AM. Barely microcytic MCV 79. Elevated ferritin at 460. B12 low at 167. Folate nml. -Continue B12 supplementation   DM A1c 7.2 on admission, Glucose mid to high 100's throughout admission thus far. AM CBG is 238 this AM, unclear if before or after breakfast. Given previous reasonable BS, will continue to monitor.  -hold metformin given previous lactic acidosis and current diarrhea -Will monitor with daily AM CBG -Consider adding SSI  Asthma -Restart home Montelukast  GERD -Restart home Protonix    FEN/GI: PO fluids, regular diet PPx: Lovenox Dispo:SNF, medically stable for D/C  Subjective:  On assessment this morning, patient is fully alert and oriented. CAM negative for the first time. He reports continued diarrhea. ROS negative for CP, SoB, N/V.   Objective: Temp:  [97.7 F (36.5 C)-99 F (37.2 C)] 97.7 F (36.5 C) (09/07 0607) Pulse Rate:  [61-65] 61 (09/07 0607) Resp:  [16-18] 16 (09/07 0607) BP: (147-155)/(58-75) 155/58 (09/07 0607) SpO2:  [97 %] 97 % (09/07 [id number]) Physical Exam Vitals reviewed.  Cardiovascular:     Rate and Rhythm: Normal rate and regular rhythm.     Pulses: Normal pulses.     Heart sounds: No murmur heard. Pulmonary:     Effort: Pulmonary effort is normal.     Breath sounds: Normal breath sounds.  Abdominal:     General: Bowel  sounds are normal. There is no distension.     Palpations: Abdomen is soft.  Skin:    General: Skin is warm and dry.  Neurological:     Mental Status: He is alert.     Laboratory: Recent Labs  Lab 10/07/20 0130 10/08/20 0541  10/09/20 0146  WBC 7.2 5.7 5.5  HGB 10.8* 10.7* 10.6*  HCT 29.8* 28.9* 29.4*  PLT 109* 129* 135*   Recent Labs  Lab 10/04/20 1123 10/05/20 0021 10/06/20 0101 10/08/20 0107 10/09/20 0146  NA 136   < > 136 137 138  K 4.2   < > 3.6 3.3* 3.2*  CL 101   < > 108 110 109  CO2 22   < > 24 17* 22  BUN 9   < > '9 10 8  '$ CREATININE 1.06   < > 1.06 1.15 1.09  CALCIUM 9.3   < > 8.4* 8.3* 8.2*  PROT 7.8  --   --   --   --   BILITOT 1.4*  --   --   --   --   ALKPHOS 69  --   --   --   --   ALT 20  --   --   --   --   AST 29  --   --   --   --   GLUCOSE 193*   < > 169* 233* 255*   < > = values in this interval not displayed.    Imaging/Diagnostic Tests: CBC and BMP as above   Corky Sox PGY-1, Psychiatry [facility] Intern pager: [phone], text pages welcome

## 2020-10-09 NOTE — Care Management Important Message (Signed)
Important Message  Patient Details  Name: Ricky [name]. MRN: [id number] Date of Birth: 1935-12-20   Medicare Important Message Given:  Yes     Landis Cassaro 10/09/2020, 4:03 PM

## 2020-10-09 NOTE — TOC Progression Note (Signed)
Transition of Care (TOC) - Progression Note    Patient Details  Name: Ricky [name]. MRN: [id number] Date of Birth: 29-Jan-1936  Transition of Care [facility]) CM/SW Contact  Joanne Chars, LCSW Phone Number: 10/09/2020, 3:03 PM  Clinical Narrative:   Bed offers given to pt and sister, they chose [facility], bed confirmed with [place].  Auth submitted to [facility], did not appear to go through, submitted a second time.  CSW looked in portal later and it says "void", called [employer], turns out both went through and one was voided, facility choice given to [facility], who reports request has been sent to medical director for review. Pt and sister updated, discussed backup plan if [facility] is denied.     Expected Discharge Plan: [place] Barriers to Discharge: Continued Medical Work up, SNF Pending bed offer  Expected Discharge Plan and Services Expected Discharge Plan: [place] Choice: [place] arrangements for the past 2 months: Single Family Home                                       Social Determinants of Health (SDOH) Interventions    Readmission Risk Interventions No flowsheet data found.

## 2020-10-10 DIAGNOSIS — A419 Sepsis, unspecified organism: Secondary | ICD-10-CM | POA: Diagnosis not present

## 2020-10-10 LAB — BASIC METABOLIC PANEL
Anion gap: 9 (ref 5–15)
BUN: 6 mg/dL — ABNORMAL LOW (ref 8–23)
CO2: 20 mmol/L — ABNORMAL LOW (ref 22–32)
Calcium: 8.7 mg/dL — ABNORMAL LOW (ref 8.9–10.3)
Chloride: 108 mmol/L (ref 98–111)
Creatinine, Ser: 0.94 mg/dL (ref 0.61–1.24)
GFR, Estimated: >60 mL/min (ref >60)
Glucose, Bld: 196 mg/dL — ABNORMAL HIGH (ref 70–99)
Potassium: 3.9 mmol/L (ref 3.5–5.1)
Sodium: 137 mmol/L (ref 135–145)

## 2020-10-10 LAB — CBC
HCT: 30.8 % — ABNORMAL LOW (ref 39.0–52.0)
Hemoglobin: 10.8 g/dL — ABNORMAL LOW (ref 13.0–17.0)
MCH: 28 pg (ref 26.0–34.0)
MCHC: 35.1 g/dL (ref 30.0–36.0)
MCV: 79.8 fL — ABNORMAL LOW (ref 80.0–100.0)
Platelets: 164 10*3/uL (ref 150–400)
RBC: 3.86 MIL/uL — ABNORMAL LOW (ref 4.22–5.81)
RDW: 16.5 % — ABNORMAL HIGH (ref 11.5–15.5)
WBC: 5.6 10*3/uL (ref 4.0–10.5)
nRBC: 0 % (ref 0.0–0.2)

## 2020-10-10 LAB — GLUCOSE, CAPILLARY: Glucose-Capillary: 165 mg/dL — ABNORMAL HIGH (ref 70–99)

## 2020-10-10 LAB — SARS CORONAVIRUS 2 (TAT 6-24 HRS): SARS Coronavirus 2: NEGATIVE

## 2020-10-10 NOTE — TOC Progression Note (Addendum)
Transition of Care (TOC) - Progression Note    Patient Details  Name: Ricky [name]. MRN: [id number] Date of Birth: Mar 25, 1935  Transition of Care [facility]) CM/SW Contact  Joanne Chars, LCSW Phone Number: 10/10/2020, 9:14 AM  Clinical Narrative:  CSW received call from [place] that SNF auth request is offering peer to peer, must be completed by 1330.  Call [phone] #5.  MD Jinny Sanders paged and informed.     1400: Peer to peer successful, [facility]i approves SNF 9/8-9/12. Plan ID: [id number].  CSW spoke with Lexine Baton at [facility] and she had a DC cancelled, will not have bed until tomorrow.  MD informed. Pt informed, pt sister informed.      Expected Discharge Plan: [place] Barriers to Discharge: Continued Medical Work up, SNF Pending bed offer  Expected Discharge Plan and Services Expected Discharge Plan: [place] Choice: [place] arrangements for the past 2 months: Single Family Home                                       Social Determinants of Health (SDOH) Interventions    Readmission Risk Interventions No flowsheet data found.

## 2020-10-10 NOTE — Progress Notes (Signed)
Physical Therapy Treatment Patient Details Name: Ricky [name]. MRN: [id number] DOB: [birth year] Today's Date: 10/10/2020    History of Present Illness Pt is an 85yo male presenting to [facility] ED on 9/2 after family found him on the floor, pt does not remember falling. CT with no acute findings. Thought to have sepsis possibly from urinary source. PMH: HTN, OSA, history of CVA.    PT Comments    Pt tolerated treatment well and progressed to ambulation in hall with RW at an increased distance. Performed 7 sit to stands in 30s without RW during session. Pt progressing toward goal and showed improvement in mobility compared to previous sessions. Continue to recommend SNF, as pt demonstrates decreased safety awareness noted by urgent bathroom use and decreased cognitive status with increased impulsivity. 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 If SNF declined, recommend HHPT upon d/c.    Follow Up Recommendations  SNF     Equipment Recommendations  3in1 (PT);Rolling walker with 5" wheels    Recommendations for Other Services       Precautions / Restrictions Precautions Precautions: Fall Precaution Comments: bowel incontinence Restrictions Weight Bearing Restrictions: No    Mobility  Bed Mobility Overal bed mobility: Modified Independent Bed Mobility: Supine to Sit     Supine to sit: HOB elevated     General bed mobility comments: HOB 15 degrees    Transfers Overall transfer level: Needs assistance   Transfers: Sit to/from Stand Sit to Stand: Supervision         General transfer comment: pt slightly impulsive with initial standing today. Pt able to rise from bed, toilet and chair. 7 repeated sit to stands in 30 sec  Ambulation/Gait Ambulation/Gait assistance: Min guard Gait Distance (Feet): 300 Feet Assistive device: Rolling walker (2 wheeled) Gait  Pattern/deviations: Step-through pattern;Decreased stride length   Gait velocity interpretation: 1.31 - 2.62 ft/sec, indicative of limited community ambulator General Gait Details: Pt moving well with assist of RW this session and able to increase ambulation distance. Supervision for safety with pt incontinent of stool during gait.   Stairs             Wheelchair Mobility    Modified Rankin (Stroke Patients Only)       Balance Overall balance assessment: Needs assistance   Sitting balance-Leahy Scale: Good     Standing balance support: Bilateral upper extremity supported Standing balance-Leahy Scale: Poor Standing balance comment: RW during gait                            Cognition Arousal/Alertness: Awake/alert Behavior During Therapy: WFL for tasks assessed/performed;Impulsive Overall Cognitive Status: No family/caregiver present to determine baseline cognitive functioning                                 General Comments: Pt demonstrating safety deficits noted with urgent bathroom use and impulsivity. Pt also disoriented to time upon MD eval.      Exercises      General Comments        Pertinent Vitals/Pain Pain Assessment: No/denies pain  PT Goals (current goals can now be found in the care plan section)      Frequency    Min 3X/week      PT Plan Current plan remains appropriate    Co-evaluation              AM-PAC PT "6 Clicks" Mobility   Outcome Measure  Help needed turning from your back to your side while in a flat bed without using bedrails?: A Little Help needed moving from lying on your back to sitting on the side of a flat bed without using bedrails?: A Little Help needed moving to and from a bed to a chair (including a wheelchair)?: A Little Help needed standing up from a chair using your arms (e.g., wheelchair or bedside chair)?: A Little Help needed  to walk in hospital room?: A Little Help needed climbing 3-5 steps with a railing? : A Little 6 Click Score: 18    End of Session Equipment Utilized During Treatment: Gait belt Activity Tolerance: Patient tolerated treatment well Patient left: in chair;with call bell/phone within reach;with chair alarm set Nurse Communication: Mobility status PT Visit Diagnosis: Difficulty in walking, not elsewhere classified (R26.2);History of falling (Z91.81);Muscle weakness (generalized) (M62.81)     Time: [id number] PT Time Calculation (min) (ACUTE ONLY): 29 min  Charges:  $Gait Training: 8-22 mins $Therapeutic Activity: 8-22 mins                     Louie Casa, SPT Acute Rehab: [phone]     Domingo Dimes 10/10/2020, 8:41 AM

## 2020-10-10 NOTE — Progress Notes (Signed)
Occupational Therapy Treatment Patient Details Name: Ricky [name]. MRN: [id number] DOB: 08/15/1935 Today's Date: 10/10/2020    History of present illness Pt is an 85yo male presenting to [facility] ED on 9/2 after family found him on the floor, pt does not remember falling. CT with no acute findings. Thought to have sepsis possibly from urinary source. PMH: HTN, OSA, history of CVA.   OT comments  Patient supine in bed and willing to participate with OT treatment.  Patient was able to get to eob and donned depends due to bowel incontinence.  Patient ambulated to bathroom and transferred to toilet with supervision and was able to perform toilet hygiene with supervision standing.  Grooming performed standing at sink with good balance.  Patient making good progress with OT treatment.  Acute OT to continue to follow.   Follow Up Recommendations  Home health OT    Equipment Recommendations  Tub/shower seat    Recommendations for Other Services      Precautions / Restrictions Precautions Precautions: Fall Precaution Comments: bowel incontinence       Mobility Bed Mobility Overal bed mobility: Modified Independent Bed Mobility: Supine to Sit     Supine to sit: HOB elevated     General bed mobility comments: HOB 15 degrees    Transfers Overall transfer level: Needs assistance Equipment used: Rolling walker (2 wheeled) Transfers: Sit to/from Stand Sit to Stand: Supervision Stand pivot transfers: Supervision       General transfer comment: Patient requires vcs for safety due to impulsive    Balance Overall balance assessment: Needs assistance Sitting-balance support: Feet supported;No upper extremity supported Sitting balance-Leahy Scale: Good Sitting balance - Comments: able to donn depends seated on eob   Standing balance support: No upper extremity supported;During functional activity Standing balance-Leahy Scale: Fair Standing balance comment: RW during gait                            ADL either performed or assessed with clinical judgement   ADL Overall ADL's : Needs assistance/impaired     Grooming: Wash/dry hands;Wash/dry face;Oral care;Supervision/safety;Standing Grooming Details (indicate cue type and reason): demonstrated good balance             Lower Body Dressing: Supervision/safety;Sit to/from stand Lower Body Dressing Details (indicate cue type and reason): Donned a depends seated on eob due to bowel incontenance Toilet Transfer: Supervision/safety;Cueing for safety;Ambulation;Regular Toilet;RW Armed forces technical officer Details (indicate cue type and reason): used rw and vcs for safety Toileting- Clothing Manipulation and Hygiene: Supervision/safety Toileting - Clothing Manipulation Details (indicate cue type and reason): performed standing     Functional mobility during ADLs: Supervision/safety General ADL Comments: impulsive requireing vcs for safety     Vision       Perception     Praxis      Cognition Arousal/Alertness: Awake/alert Behavior During Therapy: WFL for tasks assessed/performed;Impulsive Overall Cognitive Status: No family/caregiver present to determine baseline cognitive functioning                                 General Comments: Pt demonstrating safety deficits noted with urgent bathroom use and impulsivity. Pt also disoriented to time upon MD eval.        Exercises     Shoulder Instructions       General Comments      Pertinent Vitals/ Pain  Pain Assessment: No/denies pain  Home Living                                          Prior Functioning/Environment              Frequency  Min 2X/week        Progress Toward Goals  OT Goals(current goals can now be found in the care plan section)  Progress towards OT goals: Progressing toward goals  Acute Rehab OT Goals Patient Stated Goal: go home OT Goal Formulation: With patient Time For Goal  Achievement: 10/21/20 Potential to Achieve Goals: Good ADL Goals Pt Will Perform Grooming: with modified independence;sitting;standing Pt Will Perform Lower Body Bathing: with modified independence;sit to/from stand Pt Will Perform Lower Body Dressing: with modified independence;sit to/from stand Pt Will Transfer to Toilet: with modified independence;ambulating Pt Will Perform Toileting - Clothing Manipulation and hygiene: with modified independence;sit to/from stand  Plan Discharge plan remains appropriate    Co-evaluation                 AM-PAC OT "6 Clicks" Daily Activity     Outcome Measure   Help from another person eating meals?: None Help from another person taking care of personal grooming?: A Little Help from another person toileting, which includes using toliet, bedpan, or urinal?: A Little Help from another person bathing (including washing, rinsing, drying)?: A Little Help from another person to put on and taking off regular upper body clothing?: A Little Help from another person to put on and taking off regular lower body clothing?: A Little 6 Click Score: 19    End of Session Equipment Utilized During Treatment: Rolling walker  OT Visit Diagnosis: Unsteadiness on feet (R26.81)   Activity Tolerance Patient tolerated treatment well   Patient Left in bed;with call bell/phone within reach   Nurse Communication Mobility status        Time: 1337-1400 OT Time Calculation (min): 23 min  Charges: OT General Charges $OT Visit: 1 Visit OT Treatments $Self Care/Home Management : 23-37 mins  Lodema Hong, [place] 10/10/2020, 2:10 PM

## 2020-10-10 NOTE — Plan of Care (Signed)

## 2020-10-10 NOTE — Progress Notes (Signed)
Family Medicine Teaching Service Daily Progress Note Intern Pager: [phone]  Patient name: Ricky [name]. Medical record number: [id number] Date of birth: Mar 17, 1935 Age: 85 y.o. Gender: male  Primary Care Provider: [name], MD Consultants: none Code Status: FULL  Pt Overview and Major Events to Date:  9/2: admitted 9/3: Urine culture with 100,000 CFU of E. coli, pan susceptible; switched from Zosyn to cefdinir, improved mental status 9/5: failed voiding trial, foley re-placed 9/6: switch Cefdinir to cefadroxil   Assessment and Plan:   Urinary retention Patient with pre-hospital frequency and hesitancy.  Likely BPH, untreated. Failed voiding trial. Foley in. Patient denies suprapubic fullness and does not feel distended on exam.  -Continue foley, will continue until f/u with outpatient Urology -OP f/u arranged with [facility] -Continue Flomax 0.4 mg daily   Sepsis 2/2 E. Coli UTI vs prostatitis  Urine Cx positive for 100K CFU E. Coli, pan-susceptible. Off IV ABX. Received cefdinir from 9/4 to 9/6.  CT abdomen pelvis notable for mesorectal fat stranding, concerning for prostatitis.  Patient has declined prostate exam several times. -Continue cefadroxil 1g BID for 4 week course, given concern for prostatitis and need to narrow ABX   Hypokalmemia K of 3.3 yesterday, replaced with 40 meq oral. 3.2 yesterday AM, given 80 meq. Recheck of 3.9 this AM.  -Recheck K tomorrow  Diarrhea Previous concern for C. Diff, however, low index of suspicion now. Metamucil added, with improvement this AM.  -Continue Metamucil   Acute metabolic encephalopathy, resolved Patient's sister reports patient is at baseline. Alert and oriented. However, concern for patient returning home with functionally impaired wife. CAM negative past 2 days.  -SNF approved in peer to peer, bed available tomorrow -PT/OT  Thrombocytopenia Plts from [id number] this AM. Uncertain etiology, unlikely  due to Lovenox. May be hemodilution.  -Peripheral smear pending   Anemia Hgb was trending down 12.5->11.8->10.8->10.7, now stable at 10.6 this AM. Barely microcytic MCV 79. Elevated ferritin at 460. B12 low at 167. Folate nml. -Continue B12 supplementation   DM A1c 7.2 on admission, Glucose mid to high 100's throughout admission thus far. AM CBG is 165.  -hold metformin given previous lactic acidosis and current diarrhea -Will monitor with daily AM CBG   Asthma -Restart home Montelukast   GERD -Restart home Protonix    FEN/GI: PO fluids, regular diet PPx: Lovenox Dispo:SNF, medically stable for D/C  Subjective:  Patient fully alert and oriented, able to complete CAM testing. ROS negative for abnormal BM, CP, SoB, pain.   Objective: Temp:  [97.9 F (36.6 C)-98.4 F (36.9 C)] 98.1 F (36.7 C) (09/08 [id number]) Pulse Rate:  [63-67] 63 (09/08 0632) Resp:  [16-18] 16 (09/08 0632) BP: (148-171)/(64-74) 171/72 (09/08 0632) SpO2:  [98 %-100 %] 98 % (09/08 [id number]) Physical Exam Vitals reviewed.  Cardiovascular:     Rate and Rhythm: Normal rate and regular rhythm.     Pulses: Normal pulses.  Pulmonary:     Effort: Pulmonary effort is normal.     Breath sounds: Normal breath sounds.  Abdominal:     General: Bowel sounds are normal.     Palpations: Abdomen is soft.  Neurological:     Mental Status: He is alert.     Laboratory: Recent Labs  Lab 10/08/20 0541 10/09/20 0146 10/10/20 0407  WBC 5.7 5.5 5.6  HGB 10.7* 10.6* 10.8*  HCT 28.9* 29.4* 30.8*  PLT 129* 135* 164   Recent Labs  Lab 10/04/20 1123 10/05/20 0021 10/08/20 0107 10/09/20 0146 10/10/20  0407  NA 136   < > 137 138 137  K 4.2   < > 3.3* 3.2* 3.9  CL 101   < > 110 109 108  CO2 22   < > 17* 22 20*  BUN 9   < > 10 8 6*  CREATININE 1.06   < > 1.15 1.09 0.94  CALCIUM 9.3   < > 8.3* 8.2* 8.7*  PROT 7.8  --   --   --   --   BILITOT 1.4*  --   --   --   --   ALKPHOS 69  --   --   --   --   ALT 20  --   --    --   --   AST 29  --   --   --   --   GLUCOSE 193*   < > 233* 255* 196*   < > = values in this interval not displayed.    Imaging/Diagnostic Tests: K at 3.9, none other  Corky Sox PGY-1, Psychiatry FPTS Intern pager: [phone], text pages welcome

## 2020-10-10 NOTE — Progress Notes (Signed)
FPTS Brief Progress Note  S: Pt was sleeping in bed and after waking the pt, he reports that he has no complaints and would like to sleep.     O: BP (!) 159/71 (BP Location: Left Arm)   Pulse 67   Temp 98 F (36.7 C) (Oral)   Resp 16   Ht 6' (1.829 m)   Wt 90.7 kg   SpO2 98%   BMI 27.12 kg/m   General: NAD, resting comfortably in bed  Respiratory: Normal WOB Psych: Normal behavior and mood   A/P: Plan per day team - Orders reviewed. Labs for AM, which was adjusted as needed.  - Pt has SNF placement tomorrow  - No diarrhea at present    Erskine Emery, MD 10/10/2020, 8:27 PM PGY-1, [place] Night Resident  Please page [phone] with questions.

## 2020-10-10 NOTE — Discharge Summary (Addendum)
[facility] Discharge Summary  Patient name: Ricky [name]. Medical record number: [id number] Date of birth: 05-Jul-1935 Age: 85 y.o. Gender: male Date of Admission: 10/04/2020  Date of Discharge: 10/11/2020 Admitting Physician: Gladys Damme, MD  Primary Care Provider: [name], MD Consultants: None  Indication for Hospitalization: Urosepsis  Discharge Diagnoses/Problem List:  AMS 2/2 Urosepsis Urinary retention Diarrhea Thrombocytopenia Anemia History of stroke First degree AV block HLD HTN DM OSA  Disposition: SNF  Discharge Condition: Stable, improved  Discharge Exam:  Physical Exam Vitals reviewed.  Cardiovascular:     Rate and Rhythm: Normal rate and regular rhythm.     Pulses: Normal pulses.     Heart sounds: No murmur heard. Pulmonary:     Effort: Pulmonary effort is normal.     Breath sounds: Normal breath sounds.  Abdominal:     General: Bowel sounds are normal.     Palpations: Abdomen is soft.     Tenderness: There is no abdominal tenderness.  Neurological:     Mental Status: He is alert.     Brief Hospital Course:  Altered mental status likely secondary to urospesis Patient appeared delirious on admission with severe attentional deficit. ED course notable for a lactate of 5.0, UA with nitrites, and leukocytosis to 21 with left shift, ANC 18.7. Patient started on Vanc/Zosyn and given 43m/kg fluid resuscitation. UDS negative, EtOH negative. CT head non contrast with likely old infarct of right MCA. By the next day, the patient exhibited markedly improved mentation. Ucx came back with 100K CFU of pan-sensitive E. Coli. Bcx were negative. His IV ABX were switched to oral cefdinir (9/4-9/5). The ABX were then narrowed to oral cefadroxil for a 4 week course given concern for prostatitis (see below).    Urinary retention Patient with reported pre-hospital urinary hesitancy. Significant difficulty voiding in the ED. Not on an  alpha-blocker. Foley placed. Failed voiding trial. Re-placed and started on flomax 0.4 mg daily. CT A/P showing evidence of prostatitis (mesorectal fat stranding). Not on an alpha blocker. Concern for acute/chronic prostatitis, given present admission for urosepsis. He declined several prostate exams. Outpatient follow up was arranged for the patient with [facility].   Diarrhea Patient with large volume watery diarrhea at one point. Resolved with addition of metamucil.    Thrombocytopenia Plts from 148->112->109->129->135 day of D/C. Uncertain etiology, unlikely due to Lovenox. May be hemodilution.    Anemia Hgb 12.5->11.8->10.8->10.7-> 10.6->10.8. Barely microcytic MCV 79. Elevated ferritin at 460. B12 low at 167. Folate nml. Started and continued B12 supplementation   History of stroke  PAD Previous right MCA stroke noted on imaging, verified by patient. Neurology initially consulted, deemed to be due to old stroke rather than current. Home med includes cilostazol 100 mg bid. Continued during stay.   First Degree AV Block PR interval 228 ms, noted on recent PSG in July 2022. Dr. TRadford Pax cardiology, recommended zio patch outpatient. Patient to follow up with outpatient cardiologist, Dr. CBurt Knack    HLD Home medication simvastatin, lipid panel unremarkable. Continued Simvastatin 40 mg daily   Elevated blood pressure Noted to have elevated blood pressures on multiple occasions during this hospitalization. No home medications. We will defer management to his outpatient provider.    OSA Does not use CPAP regularly at home. He was not given CPAP during this hospitalization.  DM A1c 7.2 on admission, Glucose mid to high 100's throughout admission. Metformin was held given previous lactic acidosis and diarrhea.Monitored with daily CBGs. No medications  added.  Recommendations Consider addition of a 5-alpha-reductase inhibitor for BPH Repeat iron studies and ferritin outpatient once  infection cleared. Given concern for prostatitis, consider prostate exam, patient declined on multiple occasions during this hospitalization.    Issues for Follow Up:  Foley cath placed given urinary retention (to be managed with outpatient Urology)  Significant Procedures: None  Significant Labs and Imaging:  Recent Labs  Lab 10/08/20 0541 10/09/20 0146 10/10/20 0407  WBC 5.7 5.5 5.6  HGB 10.7* 10.6* 10.8*  HCT 28.9* 29.4* 30.8*  PLT 129* 135* 164   Recent Labs  Lab 10/04/20 1123 10/05/20 0021 10/05/20 0357 10/06/20 0101 10/08/20 0107 10/09/20 0146 10/10/20 0407 10/11/20 0111  NA 136   < > 137 136 137 138 137  --   K 4.2   < > 4.1 3.6 3.3* 3.2* 3.9 3.6  CL 101   < > 105 108 110 109 108  --   CO2 22   < > 23 24 17* 22 20*  --   GLUCOSE 193*   < > 183* 169* 233* 255* 196*  --   BUN 9   < > '9 9 10 8 '$ 6*  --   CREATININE 1.06   < > 1.14 1.06 1.15 1.09 0.94  --   CALCIUM 9.3   < > 8.8* 8.4* 8.3* 8.2* 8.7*  --   ALKPHOS 69  --   --   --   --   --   --   --   AST 29  --   --   --   --   --   --   --   ALT 20  --   --   --   --   --   --   --   ALBUMIN 4.1  --   --   --   --   --   --   --    < > = values in this interval not displayed.    Results/Tests Pending at Time of Discharge:  Peripheral blood smear for w/u of thrombocytopenia  Discharge Medications:  Allergies as of 10/11/2020       Reactions   Aspirin Other (See Comments)   '81mg'$  causes nose bleeds '81mg'$  causes nose bleeds        Medication List     STOP taking these medications    Naproxen 375 MG Tbec       TAKE these medications    cefadroxil 500 MG capsule Commonly known as: DURICEF Take 2 capsules (1,000 mg total) by mouth 2 (two) times daily for 22 days.   cilostazol 100 MG tablet Commonly known as: PLETAL Take 100 mg by mouth 2 (two) times daily.   cyanocobalamin 100 MCG tablet Take 1 tablet (100 mcg total) by mouth daily.   esomeprazole 40 MG capsule Commonly known as:  NEXIUM Take 40 mg by mouth daily.   metFORMIN 500 MG tablet Commonly known as: GLUCOPHAGE Take 500 mg by mouth 2 (two) times daily with a meal.   montelukast 10 MG tablet Commonly known as: SINGULAIR Take 10 mg by mouth at bedtime.   OneTouch Verio test strip Generic drug: glucose blood USE ONCE A DAY TO CHECK BLOOD SUGARS.   psyllium 95 % Pack Commonly known as: HYDROCIL/METAMUCIL Take 1 packet by mouth daily.   simvastatin 40 MG tablet Commonly known as: ZOCOR Take 40 mg by mouth at bedtime.   tamsulosin 0.4 MG Caps capsule Commonly known  as: FLOMAX Take 1 capsule (0.4 mg total) by mouth daily.        Discharge Instructions: Please refer to Patient Instructions section of EMR for full details.  Patient was counseled important signs and symptoms that should prompt return to medical care, changes in medications, dietary instructions, activity restrictions, and follow up appointments.   Follow-Up Appointments:  Follow-up Information     [facility]. Go on 10/23/2020.   Why: Please arrive 14 mins early for a 9:45 AM appt Contact information: [place]. Go on 10/21/2020.   Specialty: Family Medicine Why: Appointment at 9:10 am, please arrive 15 minutes prior to your scheduled appointment time. Contact information: [address] [id number] mc [place] PGY-1, Family Medicine

## 2020-10-11 DIAGNOSIS — G9340 Encephalopathy, unspecified: Secondary | ICD-10-CM | POA: Diagnosis not present

## 2020-10-11 DIAGNOSIS — R6520 Severe sepsis without septic shock: Secondary | ICD-10-CM | POA: Diagnosis not present

## 2020-10-11 DIAGNOSIS — A419 Sepsis, unspecified organism: Secondary | ICD-10-CM | POA: Diagnosis not present

## 2020-10-11 LAB — GLUCOSE, CAPILLARY: Glucose-Capillary: 182 mg/dL — ABNORMAL HIGH (ref 70–99)

## 2020-10-11 LAB — POTASSIUM: Potassium: 3.6 mmol/L (ref 3.5–5.1)

## 2020-10-11 MED ORDER — TAMSULOSIN HCL 0.4 MG PO CAPS: 0.4000 mg | ORAL_CAPSULE | Freq: Every day | ORAL | 1 refills | Status: AC

## 2020-10-11 MED ORDER — PSYLLIUM 95 % PO PACK: 1.0000 | PACK | Freq: Every day | ORAL | 1 refills | Status: DC

## 2020-10-11 MED ORDER — POTASSIUM CHLORIDE CRYS ER 20 MEQ PO TBCR: 40.0000 meq | EXTENDED_RELEASE_TABLET | Freq: Once | ORAL | Status: DC

## 2020-10-11 MED ORDER — CEFADROXIL 500 MG PO CAPS: 1000.0000 mg | ORAL_CAPSULE | Freq: Two times a day (BID) | ORAL | 0 refills | Status: AC

## 2020-10-11 MED ORDER — CYANOCOBALAMIN 100 MCG PO TABS: 100.0000 ug | ORAL_TABLET | Freq: Every day | ORAL | 1 refills | Status: DC

## 2020-10-11 NOTE — Progress Notes (Signed)
Physical Therapy Treatment Patient Details Name: Ricky [name]. MRN: [id number] DOB: 13-Jun-1935 Today's Date: 10/11/2020    History of Present Illness Pt is an 85yo male presenting to [facility] ED on 9/2 after family found him on the floor, pt does not remember falling. CT with no acute findings. Thought to have sepsis possibly from urinary source. PMH: HTN, OSA, history of CVA.    PT Comments    Pt tolerated treatment well, but became agitated when verbal cues and assist provided for use of RW with pivoting and navigating obstacles. Pt's impulsivity remains with inconsistent following of commands this session compared to previous sessions. Pt increased ambulation distance with RW. Felt unsafe taking the RW away this session due to impulsivity and inconsistent following of commands. Continue to recommend SNF given pt's decreased safety awareness and decreased caregiver support.    Follow Up Recommendations  SNF     Equipment Recommendations  3in1 (PT);Rolling walker with 5" wheels    Recommendations for Other Services       Precautions / Restrictions Precautions Precautions: Fall Precaution Comments: bowel incontinence (with brief during session) Restrictions Weight Bearing Restrictions: No    Mobility  Bed Mobility Overal bed mobility: Modified Independent Bed Mobility: Supine to Sit     Supine to sit: HOB elevated     General bed mobility comments: HOB 25 degrees. Impulsive to sit EOB    Transfers Overall transfer level: Needs assistance Equipment used: Rolling walker (2 wheeled) Transfers: Sit to/from Stand Sit to Stand: Supervision         General transfer comment: Required verbal cues for hand placement. Did not follow cue with impulsivity to stand  Ambulation/Gait Ambulation/Gait assistance: Min assist Gait Distance (Feet): 350 Feet Assistive device: Rolling walker (2 wheeled) Gait Pattern/deviations: Decreased step length - left;Trunk flexed;Step-through  pattern   Gait velocity interpretation: >2.62 ft/sec, indicative of community ambulatory General Gait Details: Moved well with assist from RW with min A to hold and navigate RW. Demonstarted slightly increased trunk flexion with difficulty navigating obstacles and pivoting while maintaining hips in RW. Constant verbal cues and demonstrated provided for use of RW.   Stairs             Wheelchair Mobility    Modified Rankin (Stroke Patients Only)       Balance Overall balance assessment: Needs assistance Sitting-balance support: No upper extremity supported;Feet supported Sitting balance-Leahy Scale: Good     Standing balance support: During functional activity;Bilateral upper extremity supported Standing balance-Leahy Scale: Poor Standing balance comment: RW during gait                            Cognition Arousal/Alertness: Awake/alert Behavior During Therapy: Impulsive Overall Cognitive Status: Impaired/Different from baseline Area of Impairment: Following commands;Safety/judgement                       Following Commands: Follows one step commands inconsistently Safety/Judgement: Decreased awareness of safety     General Comments: Pt oriented and verbalized day of week. Pt demonstrating safety deficits by continually not following verbal commands when walking with RW. Felt unsafe to take RW away during session. Easily distracted by people in hall when ambulating.      Exercises      General Comments General comments (skin integrity, edema, etc.): VSS on RA      Pertinent Vitals/Pain Pain Assessment: No/denies pain    Home Living  Prior Function            PT Goals (current goals can now be found in the care plan section) Progress towards PT goals: Progressing toward goals    Frequency    Min 3X/week      PT Plan Current plan remains appropriate    Co-evaluation              AM-PAC PT  "6 Clicks" Mobility   Outcome Measure  Help needed turning from your back to your side while in a flat bed without using bedrails?: A Little Help needed moving from lying on your back to sitting on the side of a flat bed without using bedrails?: A Little Help needed moving to and from a bed to a chair (including a wheelchair)?: A Little Help needed standing up from a chair using your arms (e.g., wheelchair or bedside chair)?: A Little Help needed to walk in hospital room?: A Little Help needed climbing 3-5 steps with a railing? : A Little 6 Click Score: 18    End of Session Equipment Utilized During Treatment: Gait belt Activity Tolerance: Patient tolerated treatment well;Treatment limited secondary to agitation Patient left: in chair;with call bell/phone within reach;with chair alarm set;with nursing/sitter in room Nurse Communication: Mobility status PT Visit Diagnosis: Difficulty in walking, not elsewhere classified (R26.2);History of falling (Z91.81);Muscle weakness (generalized) (M62.81)     Time: [id number] PT Time Calculation (min) (ACUTE ONLY): 14 min  Charges:  $Gait Training: 8-22 mins                     Louie Casa, SPT Acute Rehab: [phone]    Domingo Dimes 10/11/2020, 10:01 AM

## 2020-10-11 NOTE — Plan of Care (Signed)

## 2020-10-11 NOTE — Discharge Instructions (Addendum)
You were hospitalized at [facility] due to memory changes and infection.  We expect this is from a urinary infection which improved after antibiotics.  We are so glad you are feeling better.  Be sure to follow-up with your regularly scheduled appointments.  Please also be sure to follow-up with your PCP at your earliest convenience. Please keep your follow-up appointment with [facility], scheduled for 9/21 at 9:30 AM. Please continue to make medications as prescribed, especially oral cefadroxil twice daily until October 1st. Thank you for allowing [place] to be a part of your medical care.   Take care, [facility] family medicine team

## 2020-10-11 NOTE — TOC Transition Note (Signed)
Transition of Care [facility]) - CM/SW Discharge Note   Patient Details  Name: Jaeven Pamphile. MRN: [id number] Date of Birth: 1936/01/25  Transition of Care [facility]) CM/SW Contact:  Joanne Chars, LCSW Phone Number: 10/11/2020, 11:12 AM   Clinical Narrative:   Pt discharging to [facility].  RN call report to [phone].  Pt cannot transfer until 3pm, per [facility]. Transport for that time has been scheduled with [facility]>     Final next level of care: [place] Barriers to Discharge: Barriers Resolved   Patient Goals and CMS Choice   CMS Medicare.gov Compare Post Acute Care list provided to:: Patient Represenative (must comment) Choice offered to / list presented to : Sibling  Discharge Placement              Patient chooses bed at:  [facility]) Patient to be transferred to facility by: [place] Name of family member notified: wife and sister(notified 9/8) Patient and family notified of of transfer: 10/11/20  Discharge Plan and Services     Post Acute Care Choice: [place]                               Social Determinants of Health (SDOH) Interventions     Readmission Risk Interventions No flowsheet data found.

## 2020-10-11 NOTE — Plan of Care (Signed)

## 2020-10-12 NOTE — Progress Notes (Signed)
Pt alert and responsive, vs stable. Transferred from bed to stretcher. All belongings in the room of the patient were packed and sent  with pt.

## 2020-10-20 NOTE — Progress Notes (Deleted)
    SUBJECTIVE:   CHIEF COMPLAINT / HPI:   [place] follow-up: Patient is an 85 year old male who presents for follow-up after being hospitalized for urosepsis.  At the time of discharge he had a Foley catheter placed due to BPH and urinary retention.  He was discharged to skilled nursing facility. His antibiotics were narrowed to cefadroxil for a 4-week course given concern for prostatitis.  He did have follow-up with [facility].  Today he states***.  Hospital Discharge Follow Up Recommendations Consider addition of a 5-alpha-reductase inhibitor for BPH Repeat iron studies and ferritin outpatient once infection cleared. Given concern for prostatitis, consider prostate exam, patient declined on multiple occasions during this hospitalization.   PERTINENT  PMH / PSH: ***  OBJECTIVE:   There were no vitals taken for this visit. ***  General: NAD, pleasant, able to participate in exam Cardiac: RRR, no murmurs. Respiratory: CTAB, normal effort, No wheezes, rales or rhonchi Abdomen: Bowel sounds present, nontender, nondistended, no hepatosplenomegaly. Extremities: no edema or cyanosis. GU: Prostate exam *** Neuro: alert, no obvious focal deficits Psych: Normal affect and mood  ASSESSMENT/PLAN:   No problem-specific Assessment & Plan notes found for this encounter.     Lurline Del, [place]    {    This will disappear when note is signed, click to select method of visit    :1}

## 2020-10-21 ENCOUNTER — Inpatient Hospital Stay: Payer: Medicare Other

## 2020-10-22 NOTE — Telephone Encounter (Signed)
Patient is scheduled for lab study on 11/05/20. Patient understands the sleep study will be done at [facility] sleep lab. Patient understands he will receive a sleep packet in a week or so. Patient understands to call if he does not receive the sleep packet in a timely manner.   Left detailed message on voicemail with date and time of SS and informed patient to call back to confirm or reschedule @ [phone] OR [phone].

## 2020-10-26 ENCOUNTER — Other Ambulatory Visit: Payer: Self-pay | Admitting: Family Medicine

## 2020-10-26 DIAGNOSIS — E538 Deficiency of other specified B group vitamins: Secondary | ICD-10-CM

## 2020-11-05 ENCOUNTER — Encounter (HOSPITAL_BASED_OUTPATIENT_CLINIC_OR_DEPARTMENT_OTHER): Payer: Medicare Other | Admitting: Cardiology

## 2020-11-06 ENCOUNTER — Inpatient Hospital Stay: Payer: Medicare Other

## 2021-04-18 DIAGNOSIS — E1151 Type 2 diabetes mellitus with diabetic peripheral angiopathy without gangrene: Secondary | ICD-10-CM | POA: Diagnosis not present

## 2021-04-18 DIAGNOSIS — I10 Essential (primary) hypertension: Secondary | ICD-10-CM | POA: Diagnosis not present

## 2021-04-18 DIAGNOSIS — N281 Cyst of kidney, acquired: Secondary | ICD-10-CM | POA: Diagnosis not present

## 2021-04-18 DIAGNOSIS — E785 Hyperlipidemia, unspecified: Secondary | ICD-10-CM | POA: Diagnosis not present

## 2021-04-18 DIAGNOSIS — D509 Iron deficiency anemia, unspecified: Secondary | ICD-10-CM | POA: Diagnosis not present

## 2021-04-18 DIAGNOSIS — I2510 Atherosclerotic heart disease of native coronary artery without angina pectoris: Secondary | ICD-10-CM | POA: Diagnosis not present

## 2021-04-18 DIAGNOSIS — R2681 Unsteadiness on feet: Secondary | ICD-10-CM | POA: Diagnosis not present

## 2021-04-18 DIAGNOSIS — I700 Atherosclerosis of aorta: Secondary | ICD-10-CM | POA: Diagnosis not present

## 2021-04-18 DIAGNOSIS — I739 Peripheral vascular disease, unspecified: Secondary | ICD-10-CM | POA: Diagnosis not present

## 2021-06-27 DIAGNOSIS — E1151 Type 2 diabetes mellitus with diabetic peripheral angiopathy without gangrene: Secondary | ICD-10-CM | POA: Diagnosis not present

## 2021-06-27 DIAGNOSIS — I739 Peripheral vascular disease, unspecified: Secondary | ICD-10-CM | POA: Diagnosis not present

## 2021-06-27 DIAGNOSIS — I700 Atherosclerosis of aorta: Secondary | ICD-10-CM | POA: Diagnosis not present

## 2021-06-27 DIAGNOSIS — Z8673 Personal history of transient ischemic attack (TIA), and cerebral infarction without residual deficits: Secondary | ICD-10-CM | POA: Diagnosis not present

## 2021-06-27 DIAGNOSIS — M79671 Pain in right foot: Secondary | ICD-10-CM | POA: Diagnosis not present

## 2021-06-27 DIAGNOSIS — I10 Essential (primary) hypertension: Secondary | ICD-10-CM | POA: Diagnosis not present

## 2021-07-23 DIAGNOSIS — M65871 Other synovitis and tenosynovitis, right ankle and foot: Secondary | ICD-10-CM | POA: Diagnosis not present

## 2021-07-23 DIAGNOSIS — M79672 Pain in left foot: Secondary | ICD-10-CM | POA: Diagnosis not present

## 2021-07-23 DIAGNOSIS — M79671 Pain in right foot: Secondary | ICD-10-CM | POA: Diagnosis not present

## 2021-07-23 DIAGNOSIS — M19071 Primary osteoarthritis, right ankle and foot: Secondary | ICD-10-CM | POA: Diagnosis not present

## 2021-07-23 DIAGNOSIS — M25571 Pain in right ankle and joints of right foot: Secondary | ICD-10-CM | POA: Diagnosis not present

## 2021-07-28 DIAGNOSIS — H6122 Impacted cerumen, left ear: Secondary | ICD-10-CM | POA: Diagnosis not present

## 2021-07-28 DIAGNOSIS — Z974 Presence of external hearing-aid: Secondary | ICD-10-CM | POA: Diagnosis not present

## 2021-07-28 DIAGNOSIS — H93293 Other abnormal auditory perceptions, bilateral: Secondary | ICD-10-CM | POA: Diagnosis not present

## 2021-08-06 DIAGNOSIS — M25571 Pain in right ankle and joints of right foot: Secondary | ICD-10-CM | POA: Diagnosis not present

## 2021-08-06 DIAGNOSIS — M7751 Other enthesopathy of right foot: Secondary | ICD-10-CM | POA: Diagnosis not present

## 2021-08-15 DIAGNOSIS — H04123 Dry eye syndrome of bilateral lacrimal glands: Secondary | ICD-10-CM | POA: Diagnosis not present

## 2021-08-15 DIAGNOSIS — H43393 Other vitreous opacities, bilateral: Secondary | ICD-10-CM | POA: Diagnosis not present

## 2021-08-15 DIAGNOSIS — Z0100 Encounter for examination of eyes and vision without abnormal findings: Secondary | ICD-10-CM | POA: Diagnosis not present

## 2021-08-15 DIAGNOSIS — E119 Type 2 diabetes mellitus without complications: Secondary | ICD-10-CM | POA: Diagnosis not present

## 2021-08-26 DIAGNOSIS — C61 Malignant neoplasm of prostate: Secondary | ICD-10-CM | POA: Diagnosis not present

## 2021-08-26 NOTE — Progress Notes (Unsigned)
Cardiology Office Note    Date:  08/27/2021   ID:  Ricky [name]., DOB [birth year], MRN [id number]   PCP:  Ricky [name], [place]  Cardiologist:  Sherren Mocha, MD   Advanced Practice Provider:  No care team member to display Electrophysiologist:  None   [id number]   Chief Complaint  Patient presents with   Follow-up    History of Present Illness:  Ricky [name]. is a 86 y.o. male with history of presumed CAD Myoview 2016 low risk, inferior lateral infarct with mild peri-infarct ischemia treated medically, intolerant of aspirin due to epistasis, history of syncope 09/2017 atenolol stopped secondary to bradycardia event monitor negative for arrhythmia, echo normal LVEF 60 to 65%, PAD with chronic left SFA occlusion, hypertension, HLD, history of CVA, DM, OSA managed by Dr. Radford [name].  History of possible 2:1 AV block on sleep study 09/2020 was to wear a monitor but never done. Monitor  08/2019 no high grade heart block or AFib.  Patient comes in with his sister. His wife died a month ago so he's living alone. Denies chest pain, dyspnea, palpitations,  presyncope. Occasional dizziness when he first gets up in the am. He stands still and it passes. Not using CPAP-he turned it in over a year ago because he said it wasn't working and it was suffocating him. No regular exercise. Occasionally walks 1/2 mile.      Past Medical History:  Diagnosis Date   HLD (hyperlipidemia)    HTN (hypertension)    Memory difficulty    OSA on CPAP 06/23/2018   Moderate obstructive sleep apnea with an AHI of 28.3/h with oxygen desaturations as low as 81%.  Now on CPAP at 15 cm H2O.   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   ONETOUCH VERIO test strip USE ONCE A DAY TO CHECK BLOOD SUGARS.   simvastatin (ZOCOR) 40 MG tablet Take 40 mg by mouth at bedtime.   tamsulosin (FLOMAX) 0.4 MG CAPS capsule Take 1 capsule (0.4 mg total) by mouth daily.     Allergies:   Aspirin   Social History   Socioeconomic History   Marital status: Married    Spouse name: Ricky [name]   Number of children: 0   Years of education: 11   Highest education level: Not on file  Occupational History   Occupation: Retired  Tobacco Use   Smoking status: Former    Types: Cigarettes    Quit date: 02/03/1992    Years since quitting: 29.5   Smokeless tobacco: Never  Vaping Use   Vaping Use: Never used  Substance and Sexual Activity   Alcohol use: No   Drug use: No   Sexual activity: Not on file  Other Topics Concern   Not on file  Social History Narrative   Lives with wife   Caffeine use: none   Right handed    Social Determinants of Health   Financial Resource Strain: Not on file  Food Insecurity: Not on file  Transportation Needs: Not on file  Physical Activity: Not  on file  Stress: Not on file  Social Connections: Not on file     Family History:  The patient's  family history includes Coronary artery disease in his unknown relative; Heart attack in his brother, father, and mother; Heart disease in his brother, father, and mother.   ROS:   Please see the history of present illness.    ROS All other systems reviewed and are negative.   PHYSICAL EXAM:   VS:  BP 130/66   Pulse 92   Ht 6' (1.829 m)   Wt 183 lb (83 kg)   SpO2 95%   BMI 24.82 kg/m   Physical Exam  GEN: Well nourished, well developed, in no acute distress  Neck: no JVD, carotid bruits, or masses Cardiac:RRR; 2/6 systolic murmur LSB Respiratory:  clear to auscultation bilaterally, normal work of breathing GI: soft, nontender, nondistended, + BS Ext: without  cyanosis, clubbing, or edema, Good distal pulses bilaterally Neuro:  Alert and Oriented x 3,  Psych: euthymic mood, full affect  Wt Readings from Last 3 Encounters:  08/27/21 183 lb (83 kg)  10/04/20 200 lb (90.7 kg)  09/01/20 192 lb (87.1 kg)      Studies/Labs Reviewed:   EKG:  EKG is  ordered today.  The ekg ordered today demonstrates NSR inf Q waves  Recent Labs: 10/04/2020: ALT 20 10/10/2020: BUN 6; Creatinine, Ser 0.94; Hemoglobin 10.8; Platelets 164; Sodium 137 10/11/2020: Potassium 3.6   Lipid Panel    Component Value Date/Time   CHOL 162 10/05/2020 0021   TRIG 50 10/05/2020 0021   HDL 65 10/05/2020 0021   CHOLHDL 2.5 10/05/2020 0021   VLDL 10 10/05/2020 0021   LDLCALC 87 10/05/2020 0021    Additional studies/ records that were reviewed today include:   Event monitor 08/28/19  The basic rhythm is normal sinus with an average HR of 62 bpm (range 38-114) No atrial fibrillation or flutter No high-grade heart block or pathologic pauses There are rare PVC's and a 4 beat ventricular run  No sustained arrhythmias Event Monitor 09/30/17 1. The basic rhythm is normal sinus 2. There are short runs of nonsustained VT 4-5 beats 3. There are few episodes of pSVT, non-sustained 4. No sustained arrhythmia, atrial fibrillation, or pathologic pauses > 3 seconds   Echo 09/30/17 Mod conc LVH, EF 60-65, no RWMA, normal diastolic function   Carotid US 05/06/17 Bilateral ICA 1-39   Nuclear stress test 08/14/14 Small area of inferolateral wall infarct at mid and basal level with mild peri infarct ischemia EF 59%  Low Risk   Echo 08/14/14 Normal LV function; grade 1 diastolic dysfunction; mild TR. PASP 32     Risk Assessment/Calculations:         ASSESSMENT:    1. Coronary artery disease involving native coronary artery of native heart without angina pectoris   2. Bradycardia   3. AV block, 2nd degree   4. Murmur   5. OSA on CPAP   6. PAD (peripheral artery disease) ([place])    7. Essential hypertension   8. Other hyperlipidemia   9. History of CVA (cerebrovascular accident)      PLAN:  In order of problems listed above:  CAD Myoview 2016 low risk, inferior lateral infarct with mild peri-infarct ischemia treated medically, intolerant of aspirin due to epistasis. No chest pain. On statin and LDL 6610/2022  history of syncope 09/2017 atenolol stopped secondary to bradycardia event monitor negative for arrhythmia-no recurrent syncope. HR 92 today  2:1 AV  block on sleep study 09/2020 Zio recommended but not done. Patient not using CPAP-will hold off on monitor until back on CPAP. No syncope  Systolic murmur-update echo  OSA no longer on CPAP-sent his machine back a year ago because it was smothering him. Will check with [facility] to see if he needs another sleep study or just another mask. F/u with Dr. Radford [name].  PAD chronic LSFA occlusion managed conservatively-no symptoms  HTN controlled off meds  HLD LDL 66 11/2020 on zocor 40 mg  History of  CVA  DM2 managed by PCP     Shared Decision Making/Informed Consent        Medication Adjustments/Labs and Tests Ordered: Current medicines are reviewed at length with the patient today.  Concerns regarding medicines are outlined above.  Medication changes, Labs and Tests ordered today are listed in the Patient Instructions below. Patient Instructions  Medication Instructions:  Your physician recommends that you continue on your current medications as directed. Please refer to the Current Medication list given to you today.  *If you need a refill on your cardiac medications before your next appointment, please call your pharmacy*   Lab Work: None If you have labs (blood work) drawn today and your tests are completely normal, you will receive your results only by: [place] (if you have MyChart) OR A paper copy in the mail If you have any lab test that is abnormal or we need to change your treatment, we  will call you to review the results.   Testing/Procedures: Your physician has requested that you have an echocardiogram. Echocardiography is a painless test that uses sound waves to create images of your heart. It provides your doctor with information about the size and shape of your heart and how well your heart's chambers and valves are working. This procedure takes approximately one hour. There are no restrictions for this procedure.   Follow-Up: At [facility], you and your health needs are our priority.  As part of our continuing mission to provide you with exceptional heart care, we have created designated Provider Care Teams.  These Care Teams include your primary Cardiologist (physician) and Advanced Practice Providers (APPs -  Physician Assistants and Nurse Practitioners) who all work together to provide you with the care you need, when you need it.  We recommend signing up for the patient portal called "MyChart".  Sign up information is provided on this After Visit Summary.  MyChart is used to connect with patients for Virtual Visits (Telemedicine).  Patients are able to view lab/test results, encounter notes, upcoming appointments, etc.  Non-urgent messages can be sent to your provider as well.   To learn more about what you can do with MyChart, go to [email or web address].    Your next appointment:   4 month(s)  The format for your next appointment:   In Person  Provider:   Fransico Him, MD   Your provider recommends that you maintain 150 minutes per week of moderate aerobic activity.     Sumner Boast, PA-C  08/27/2021 10:32 AM    [facility] [address], [place], [place]  [address] Phone: [phone]; Fax: [phone]

## 2021-08-27 ENCOUNTER — Encounter: Payer: Self-pay | Admitting: Physician Assistant

## 2021-08-27 ENCOUNTER — Ambulatory Visit: Payer: Medicare HMO | Admitting: Physician Assistant

## 2021-08-27 VITALS — BP 130/66 | HR 92 | Ht 72.0 in | Wt 183.0 lb

## 2021-08-27 DIAGNOSIS — G4733 Obstructive sleep apnea (adult) (pediatric): Secondary | ICD-10-CM | POA: Diagnosis not present

## 2021-08-27 DIAGNOSIS — I10 Essential (primary) hypertension: Secondary | ICD-10-CM

## 2021-08-27 DIAGNOSIS — I739 Peripheral vascular disease, unspecified: Secondary | ICD-10-CM

## 2021-08-27 DIAGNOSIS — I2510 Atherosclerotic heart disease of native coronary artery without angina pectoris: Secondary | ICD-10-CM | POA: Diagnosis not present

## 2021-08-27 DIAGNOSIS — E7849 Other hyperlipidemia: Secondary | ICD-10-CM

## 2021-08-27 DIAGNOSIS — Z8673 Personal history of transient ischemic attack (TIA), and cerebral infarction without residual deficits: Secondary | ICD-10-CM

## 2021-08-27 DIAGNOSIS — R011 Cardiac murmur, unspecified: Secondary | ICD-10-CM

## 2021-08-27 DIAGNOSIS — R001 Bradycardia, unspecified: Secondary | ICD-10-CM

## 2021-08-27 DIAGNOSIS — Z9989 Dependence on other enabling machines and devices: Secondary | ICD-10-CM

## 2021-08-27 DIAGNOSIS — I441 Atrioventricular block, second degree: Secondary | ICD-10-CM | POA: Diagnosis not present

## 2021-08-27 NOTE — Patient Instructions (Signed)
Medication Instructions:  Your physician recommends that you continue on your current medications as directed. Please refer to the Current Medication list given to you today.  *If you need a refill on your cardiac medications before your next appointment, please call your pharmacy*   Lab Work: None If you have labs (blood work) drawn today and your tests are completely normal, you will receive your results only by: [place] (if you have MyChart) OR A paper copy in the mail If you have any lab test that is abnormal or we need to change your treatment, we will call you to review the results.   Testing/Procedures: Your physician has requested that you have an echocardiogram. Echocardiography is a painless test that uses sound waves to create images of your heart. It provides your doctor with information about the size and shape of your heart and how well your heart's chambers and valves are working. This procedure takes approximately one hour. There are no restrictions for this procedure.   Follow-Up: At [facility], you and your health needs are our priority.  As part of our continuing mission to provide you with exceptional heart care, we have created designated Provider Care Teams.  These Care Teams include your primary Cardiologist (physician) and Advanced Practice Providers (APPs -  Physician Assistants and Nurse Practitioners) who all work together to provide you with the care you need, when you need it.  We recommend signing up for the patient portal called "MyChart".  Sign up information is provided on this After Visit Summary.  MyChart is used to connect with patients for Virtual Visits (Telemedicine).  Patients are able to view lab/test results, encounter notes, upcoming appointments, etc.  Non-urgent messages can be sent to your provider as well.   To learn more about what you can do with MyChart, go to [email or web address].    Your next appointment:   4 month(s)  The  format for your next appointment:   In Person  Provider:   Fransico Him, MD   Your provider recommends that you maintain 150 minutes per week of moderate aerobic activity.

## 2021-09-02 DIAGNOSIS — I700 Atherosclerosis of aorta: Secondary | ICD-10-CM | POA: Diagnosis not present

## 2021-09-02 DIAGNOSIS — I10 Essential (primary) hypertension: Secondary | ICD-10-CM | POA: Diagnosis not present

## 2021-09-02 DIAGNOSIS — I6523 Occlusion and stenosis of bilateral carotid arteries: Secondary | ICD-10-CM | POA: Diagnosis not present

## 2021-09-02 DIAGNOSIS — E785 Hyperlipidemia, unspecified: Secondary | ICD-10-CM | POA: Diagnosis not present

## 2021-09-02 DIAGNOSIS — R2681 Unsteadiness on feet: Secondary | ICD-10-CM | POA: Diagnosis not present

## 2021-09-02 DIAGNOSIS — I739 Peripheral vascular disease, unspecified: Secondary | ICD-10-CM | POA: Diagnosis not present

## 2021-09-02 DIAGNOSIS — D509 Iron deficiency anemia, unspecified: Secondary | ICD-10-CM | POA: Diagnosis not present

## 2021-09-02 DIAGNOSIS — I2510 Atherosclerotic heart disease of native coronary artery without angina pectoris: Secondary | ICD-10-CM | POA: Diagnosis not present

## 2021-09-02 DIAGNOSIS — E1151 Type 2 diabetes mellitus with diabetic peripheral angiopathy without gangrene: Secondary | ICD-10-CM | POA: Diagnosis not present

## 2021-09-15 DIAGNOSIS — C61 Malignant neoplasm of prostate: Secondary | ICD-10-CM | POA: Diagnosis not present

## 2021-09-15 DIAGNOSIS — R3914 Feeling of incomplete bladder emptying: Secondary | ICD-10-CM | POA: Diagnosis not present

## 2021-09-15 DIAGNOSIS — N401 Enlarged prostate with lower urinary tract symptoms: Secondary | ICD-10-CM | POA: Diagnosis not present

## 2021-09-16 ENCOUNTER — Encounter (HOSPITAL_COMMUNITY): Payer: Self-pay | Admitting: Physician Assistant

## 2021-09-16 ENCOUNTER — Ambulatory Visit (HOSPITAL_COMMUNITY): Payer: Medicare HMO | Attending: Cardiovascular Disease

## 2021-09-22 ENCOUNTER — Ambulatory Visit (HOSPITAL_COMMUNITY): Payer: Medicare HMO | Attending: Physician Assistant

## 2021-09-22 DIAGNOSIS — R011 Cardiac murmur, unspecified: Secondary | ICD-10-CM | POA: Insufficient documentation

## 2021-09-22 LAB — ECHOCARDIOGRAM COMPLETE
Area-P 1/2: 2.19 cm2
S' Lateral: 2.6 cm

## 2021-10-31 ENCOUNTER — Telehealth: Payer: Self-pay | Admitting: *Deleted

## 2021-10-31 DIAGNOSIS — G4733 Obstructive sleep apnea (adult) (pediatric): Secondary | ICD-10-CM

## 2021-10-31 DIAGNOSIS — I2510 Atherosclerotic heart disease of native coronary artery without angina pectoris: Secondary | ICD-10-CM

## 2021-10-31 DIAGNOSIS — I441 Atrioventricular block, second degree: Secondary | ICD-10-CM

## 2021-10-31 NOTE — Telephone Encounter (Signed)
-----   Message from Imogene Burn, PA-C sent at 08/27/2021 10:32 AM EDT ----- Needs new cpap or sleep study. Please call his sister. thanks

## 2021-11-11 DIAGNOSIS — Z111 Encounter for screening for respiratory tuberculosis: Secondary | ICD-10-CM | POA: Diagnosis not present

## 2021-11-13 DIAGNOSIS — R7611 Nonspecific reaction to tuberculin skin test without active tuberculosis: Secondary | ICD-10-CM | POA: Diagnosis not present

## 2021-11-18 DIAGNOSIS — I739 Peripheral vascular disease, unspecified: Secondary | ICD-10-CM | POA: Diagnosis not present

## 2021-11-18 DIAGNOSIS — R413 Other amnesia: Secondary | ICD-10-CM | POA: Diagnosis not present

## 2021-11-18 DIAGNOSIS — D509 Iron deficiency anemia, unspecified: Secondary | ICD-10-CM | POA: Diagnosis not present

## 2021-11-18 DIAGNOSIS — I2510 Atherosclerotic heart disease of native coronary artery without angina pectoris: Secondary | ICD-10-CM | POA: Diagnosis not present

## 2021-11-18 DIAGNOSIS — E1151 Type 2 diabetes mellitus with diabetic peripheral angiopathy without gangrene: Secondary | ICD-10-CM | POA: Diagnosis not present

## 2021-11-18 DIAGNOSIS — I10 Essential (primary) hypertension: Secondary | ICD-10-CM | POA: Diagnosis not present

## 2021-11-18 DIAGNOSIS — E785 Hyperlipidemia, unspecified: Secondary | ICD-10-CM | POA: Diagnosis not present

## 2021-11-18 DIAGNOSIS — I6523 Occlusion and stenosis of bilateral carotid arteries: Secondary | ICD-10-CM | POA: Diagnosis not present

## 2021-11-18 DIAGNOSIS — G4733 Obstructive sleep apnea (adult) (pediatric): Secondary | ICD-10-CM | POA: Diagnosis not present

## 2021-12-10 ENCOUNTER — Ambulatory Visit (HOSPITAL_BASED_OUTPATIENT_CLINIC_OR_DEPARTMENT_OTHER): Payer: Medicare HMO | Attending: Physician Assistant | Admitting: Cardiology

## 2021-12-10 VITALS — Ht 72.0 in | Wt 183.0 lb

## 2021-12-10 DIAGNOSIS — I441 Atrioventricular block, second degree: Secondary | ICD-10-CM | POA: Insufficient documentation

## 2021-12-10 DIAGNOSIS — I2510 Atherosclerotic heart disease of native coronary artery without angina pectoris: Secondary | ICD-10-CM | POA: Diagnosis not present

## 2021-12-10 DIAGNOSIS — I493 Ventricular premature depolarization: Secondary | ICD-10-CM | POA: Insufficient documentation

## 2021-12-10 DIAGNOSIS — G4731 Primary central sleep apnea: Secondary | ICD-10-CM | POA: Insufficient documentation

## 2021-12-10 DIAGNOSIS — G4733 Obstructive sleep apnea (adult) (pediatric): Secondary | ICD-10-CM | POA: Insufficient documentation

## 2021-12-10 DIAGNOSIS — I25810 Atherosclerosis of coronary artery bypass graft(s) without angina pectoris: Secondary | ICD-10-CM | POA: Insufficient documentation

## 2021-12-11 NOTE — Procedures (Signed)
   Patient Name: Ricky [name], Ricky [name] Date: 12/10/2021 Gender: Male D.O.B: 02/04/1935 Age (years): 77 Referring Provider: Fransico Him MD, ABSM Height (inches): 70 Interpreting Physician: Fransico Him MD, ABSM Weight (lbs): 192 RPSGT: Jorge Ny BMI: 28 MRN: [id number] Neck Size: 15.50  CLINICAL INFORMATION The patient is referred for a BiPAP titration to treat sleep apnea.  SLEEP STUDY TECHNIQUE As per the AASM Manual for the Scoring of Sleep and Associated Events v2.3 (April 2016) with a hypopnea requiring 4% desaturations.  The channels recorded and monitored were frontal, central and occipital EEG, electrooculogram (EOG), submentalis EMG (chin), nasal and oral airflow, thoracic and abdominal wall motion, anterior tibialis EMG, snore microphone, electrocardiogram, and pulse oximetry. Bilevel positive airway pressure (BPAP) was initiated at the beginning of the study and titrated to treat sleep-disordered breathing.  MEDICATIONS Medications self-administered by patient taken the night of the study : N/A  RESPIRATORY PARAMETERS Optimal IPAP Pressure (cm): N/A  AHI at Optimal Pressure (/hr) N/A Optimal EPAP Pressure (cm):N/A  Overall Minimal O2 (%):89.0  Minimal O2 at Optimal Pressure (%): N/A  SLEEP ARCHITECTURE Start Time:10:16:44 PM  Stop Time:5:21:19 AM  Total Time (min):424.6  Total Sleep Time (min):181 Sleep Latency (min):102.4  Sleep Efficiency (%):42.6%  REM Latency (min):88.5  WASO (min):141.1 Stage N1 (%):11.6%  Stage N2 (%): 72.4%  Stage N3 (%): 0.0%  Stage R (%): 16 Supine (%):46.96  Arousal Index (/hr):35.8   CARDIAC DATA The 2 lead EKG demonstrated sinus rhythm. The mean heart rate was 56.6 beats per minute. Other EKG findings include: PVCs.  LEG MOVEMENT DATA The total Periodic Limb Movements of Sleep (PLMS) were 0. The PLMS index was 0.0. A PLMS index of <15 is considered normal in adults.  IMPRESSIONS - An optimal PAP pressure could not be  selected for this patient based on the available study data. - Mild Central Sleep Apnea was noted during this titration (CAI = 13.3/h). - Mild oxygen desaturations were observed during this titration (min O2 = 89.0%). - The patient snored with soft snoring volume. - 2-lead EKG demonstrated: PVCs - Clinically significant periodic limb movements were not noted during this study. Arousals associated with PLMs were significant.  DIAGNOSIS - Obstructive Sleep Apnea (G47.33)  RECOMMENDATIONS - Recommend a trial of ResMed auto CPAP from 4 to 20cm H2O with heated humidity and mask of choice. - Avoid alcohol, sedatives and other CNS depressants that may worsen sleep apnea and disrupt normal sleep architecture. - Sleep hygiene should be reviewed to assess factors that may improve sleep quality. - Weight management and regular exercise should be initiated or continued. - Return to Sleep Center for re-evaluation after 6 weeks of therapy  [Electronically signed] 12/11/2021 11:14 PM  Fransico Him MD, [place], American Board of Sleep Medicine NPI: [id number]

## 2021-12-22 ENCOUNTER — Telehealth: Payer: Self-pay | Admitting: *Deleted

## 2021-12-22 NOTE — Telephone Encounter (Signed)
-----   Message from Lauralee Evener, [place] sent at 12/12/2021  8:55 AM EST -----  ----- Message ----- From: Sueanne Margarita, MD Sent: 12/11/2021  11:16 PM EST To: Cv Div Sleep Studies  Please let patient know that they had a successful PAP titration and let DME know that orders are in EPIC.  Please set up 6 week OV with me.

## 2021-12-22 NOTE — Telephone Encounter (Signed)
The patient has been notified of the result. Left detailed message on voicemail and informed patient to call back..Shamecca [place]bread [place], CMA   

## 2021-12-24 NOTE — Telephone Encounter (Signed)
The patient has been notified of the result and verbalized understanding.  All questions (if any) were answered. Ricky [name], [place] 12/24/2021 6:08 PM    Upon patient request DME selection is [facility]. Patient understands he will be contacted by [facility] to set up his cpap. Patient understands to call if [name] does not contact him with new setup in a timely manner. Patient understands they will be called once confirmation has been received from [facility]/ that they have received their new machine to schedule 10 week follow up appointment.   [place] notified of new cpap order  Please add to airview Patient was grateful for the call and thanked me.

## 2022-01-02 ENCOUNTER — Ambulatory Visit: Payer: Medicare HMO | Admitting: Cardiology

## 2022-01-02 NOTE — Progress Notes (Deleted)
Date:  01/02/2022   ID:  Ricky [name]., DOB January 22, 1936, MRN [id number]   PCP:  Ricky Bowen, MD  Cardiologist:  Sherren Mocha, MD  Sleep Medicine;  Fransico Him, MD Electrophysiologist:  None   Chief Complaint:  OSA  History of Present Illness:    Ricky [name]. is a 86 y.o. male with a hx of PVD, DM, HTN, HLD, CVA and bradycardia.  He had a syncopal episode with no arrhythmia on event monitor. Due to hx of snoring and concern for possibly falling asleep while driving instead of syncope, a home sleep study was ordered.  He tells me that he feels tired when he gets up in the am and gets tired during the day as well.  His sleep  showed moderate OSA with an AHi of 28.3/hr and nocturnal hypoxemia with lowest O2 81%.  He underwent CPAP titr2ation to 15cm H2O.    He initially was doing well with his CPAP device but his device stopped working and he took it back to the DME.  He never went and picked it back because he says that they never called him back.  No the DME requires another sleep study to restart CPAP.    He underwent repeat sleep study which showed moderate OSA with an AHI of 23.9/hr.  PAP titration done and started on auto CPAP from 4 to 20cm H2O.  He is doing well with his PAP device and thinks that he has gotten used to it.  He tolerates the mask and feels the pressure is adequate.  Since going on PAP he feels rested in the am and has no significant daytime sleepiness.  He denies any significant mouth or nasal dryness or nasal congestion.  He does not think that he snores.    Prior CV studies:   The following studies were reviewed today:  Sleep study, CPAP titration  Past Medical History:  Diagnosis Date   HLD (hyperlipidemia)    HTN (hypertension)    Memory difficulty    OSA on CPAP 06/23/2018   Moderate obstructive sleep apnea with an AHI of 28.3/h with oxygen desaturations as low as 81%.  Now on CPAP at 15 cm H2O.   Prostate cancer ([place])    PVD (peripheral vascular  disease) ([place])    Stroke (cerebrum) ([facility])    Right MCA distribution   Past Surgical History:  Procedure Laterality Date   BACK SURGERY  1994   cervical   CHOLECYSTECTOMY       No outpatient medications have been marked as taking for the 01/02/22 encounter (Appointment) with Sueanne Margarita, MD.     Allergies:   Aspirin   Social History   Tobacco Use   Smoking status: Former    Types: Cigarettes    Quit date: 02/03/1992    Years since quitting: 29.9   Smokeless tobacco: Never  Vaping Use   Vaping Use: Never used  Substance Use Topics   Alcohol use: No   Drug use: No     Family Hx: The patient's family history includes Coronary artery disease in his unknown relative; Heart attack in his brother, father, and mother; Heart disease in his brother, father, and mother.  ROS:   Please see the history of present illness.     All other systems reviewed and are negative.   Labs/Other Tests and Data Reviewed:    Recent Labs: No results found for requested labs within last 365 days.   Recent Lipid Panel Lab Results  Component Value Date/Time   CHOL 162 10/05/2020 12:21 AM   TRIG 50 10/05/2020 12:21 AM   HDL 65 10/05/2020 12:21 AM   CHOLHDL 2.5 10/05/2020 12:21 AM   LDLCALC 87 10/05/2020 12:21 AM    Wt Readings from Last 3 Encounters:  12/10/21 183 lb (83 kg)  08/27/21 183 lb (83 kg)  10/04/20 200 lb (90.7 kg)     Objective:    Vital Signs:  There were no vitals taken for this visit.   GEN: Well nourished, well developed in no acute distress HEENT: Normal NECK: No JVD; No carotid bruits LYMPHATICS: No lymphadenopathy CARDIAC:RRR, no murmurs, rubs, gallops RESPIRATORY:  Clear to auscultation without rales, wheezing or rhonchi  ABDOMEN: Soft, non-tender, non-distended MUSCULOSKELETAL:  No edema; No deformity  SKIN: Warm and dry NEUROLOGIC:  Alert and oriented x 3 PSYCHIATRIC:  Normal affect  ASSESSMENT & PLAN:    1.  OSA  -he was noncompliant with his  device because he said it was drowning him -he never went to pick his device back up at the DME after dropping it off to be checked and at last OV was out of compliance and needed another sleep study to get another PAp device -repeat sleep study showed moderate OSA with an AHI of 23.9/hr -PAP titration started on auto CPAP from 4 to 20cm H2O -The patient is tolerating PAP therapy well without any problems. The PAP download performed by his DME was personally reviewed and interpreted by me today and showed an AHI of ***/hr on *** cm H2O with ***% compliance in using more than 4 hours nightly.  The patient has been using and benefiting from PAP use and will continue to benefit from therapy.    2.  HTN -BP controlled -not on any antihypertensive meds   Medication Adjustments/Labs and Tests Ordered: Current medicines are reviewed at length with the patient today.  Concerns regarding medicines are outlined above.  Tests Ordered: No orders of the defined types were placed in this encounter.  Medication Changes: No orders of the defined types were placed in this encounter.   Disposition:  Follow up after sleep study  Signed, Fransico Him, MD  01/02/2022 12:10 AM    [place]

## 2022-01-13 DIAGNOSIS — D509 Iron deficiency anemia, unspecified: Secondary | ICD-10-CM | POA: Diagnosis not present

## 2022-01-14 DIAGNOSIS — Z125 Encounter for screening for malignant neoplasm of prostate: Secondary | ICD-10-CM | POA: Diagnosis not present

## 2022-01-14 DIAGNOSIS — E785 Hyperlipidemia, unspecified: Secondary | ICD-10-CM | POA: Diagnosis not present

## 2022-01-14 DIAGNOSIS — E1151 Type 2 diabetes mellitus with diabetic peripheral angiopathy without gangrene: Secondary | ICD-10-CM | POA: Diagnosis not present

## 2022-01-14 DIAGNOSIS — I700 Atherosclerosis of aorta: Secondary | ICD-10-CM | POA: Diagnosis not present

## 2022-01-14 DIAGNOSIS — R7989 Other specified abnormal findings of blood chemistry: Secondary | ICD-10-CM | POA: Diagnosis not present

## 2022-01-14 DIAGNOSIS — D509 Iron deficiency anemia, unspecified: Secondary | ICD-10-CM | POA: Diagnosis not present

## 2022-01-16 DIAGNOSIS — I739 Peripheral vascular disease, unspecified: Secondary | ICD-10-CM | POA: Diagnosis not present

## 2022-01-16 DIAGNOSIS — I10 Essential (primary) hypertension: Secondary | ICD-10-CM | POA: Diagnosis not present

## 2022-01-16 DIAGNOSIS — Z0000 Encounter for general adult medical examination without abnormal findings: Secondary | ICD-10-CM | POA: Diagnosis not present

## 2022-01-16 DIAGNOSIS — R413 Other amnesia: Secondary | ICD-10-CM | POA: Diagnosis not present

## 2022-01-16 DIAGNOSIS — I700 Atherosclerosis of aorta: Secondary | ICD-10-CM | POA: Diagnosis not present

## 2022-01-16 DIAGNOSIS — Z8546 Personal history of malignant neoplasm of prostate: Secondary | ICD-10-CM | POA: Diagnosis not present

## 2022-01-16 DIAGNOSIS — R82998 Other abnormal findings in urine: Secondary | ICD-10-CM | POA: Diagnosis not present

## 2022-01-16 DIAGNOSIS — I2510 Atherosclerotic heart disease of native coronary artery without angina pectoris: Secondary | ICD-10-CM | POA: Diagnosis not present

## 2022-01-16 DIAGNOSIS — E785 Hyperlipidemia, unspecified: Secondary | ICD-10-CM | POA: Diagnosis not present

## 2022-01-16 DIAGNOSIS — E1151 Type 2 diabetes mellitus with diabetic peripheral angiopathy without gangrene: Secondary | ICD-10-CM | POA: Diagnosis not present

## 2022-03-09 DIAGNOSIS — C61 Malignant neoplasm of prostate: Secondary | ICD-10-CM | POA: Diagnosis not present

## 2022-03-16 DIAGNOSIS — R3914 Feeling of incomplete bladder emptying: Secondary | ICD-10-CM | POA: Diagnosis not present

## 2022-03-16 DIAGNOSIS — N401 Enlarged prostate with lower urinary tract symptoms: Secondary | ICD-10-CM | POA: Diagnosis not present

## 2022-03-16 DIAGNOSIS — C61 Malignant neoplasm of prostate: Secondary | ICD-10-CM | POA: Diagnosis not present

## 2022-04-15 ENCOUNTER — Telehealth: Payer: Self-pay | Admitting: Cardiology

## 2022-04-15 ENCOUNTER — Encounter: Payer: Self-pay | Admitting: Cardiology

## 2022-04-15 NOTE — Telephone Encounter (Signed)
Called to r/s appt on 03/27 with Dr. Radford Pax, VM left 2x and then on the 3rd call the patient's MB was full. Will send letter to pt

## 2022-04-29 ENCOUNTER — Ambulatory Visit: Payer: Medicare HMO | Admitting: Cardiology

## 2022-07-23 ENCOUNTER — Encounter: Payer: Self-pay | Admitting: Cardiology

## 2022-07-23 ENCOUNTER — Ambulatory Visit: Payer: Medicare PPO | Attending: Cardiology | Admitting: Cardiology

## 2022-07-23 VITALS — BP 130/76 | HR 77 | Ht 72.0 in | Wt 187.2 lb

## 2022-07-23 NOTE — Progress Notes (Signed)
This encounter was created in error - please disregard.  This encounter was created in error - please disregard.

## 2022-08-04 DIAGNOSIS — G4733 Obstructive sleep apnea (adult) (pediatric): Secondary | ICD-10-CM | POA: Diagnosis not present

## 2022-09-04 ENCOUNTER — Ambulatory Visit: Payer: Medicare PPO | Attending: Cardiology | Admitting: Cardiology

## 2022-09-04 ENCOUNTER — Encounter: Payer: Self-pay | Admitting: Cardiology

## 2022-09-04 VITALS — BP 122/62 | HR 94 | Ht 72.0 in | Wt 187.4 lb

## 2022-09-04 DIAGNOSIS — I2510 Atherosclerotic heart disease of native coronary artery without angina pectoris: Secondary | ICD-10-CM

## 2022-09-04 DIAGNOSIS — G4733 Obstructive sleep apnea (adult) (pediatric): Secondary | ICD-10-CM | POA: Diagnosis not present

## 2022-09-04 DIAGNOSIS — I10 Essential (primary) hypertension: Secondary | ICD-10-CM

## 2022-09-04 NOTE — Progress Notes (Addendum)
Date:  09/04/2022   ID:  Ricky Barges., DOB 02-Oct-1935, MRN [id number]   PCP:  Adrian Prince, MD  Cardiologist:  Tonny Bollman, MD  Sleep Medicine;  Armanda Magic, MD Electrophysiologist:  None   Chief Complaint:  OSA  History of Present Illness:    Ricky Bruun. is a 87 y.o. male with a hx of PVD, DM, HTN, HLD, CVA and bradycardia.  He had a syncopal episode with no arrhythmia on event monitor. Due to hx of snoring and concern for possibly falling asleep while driving instead of syncope, a home sleep study was ordered. His sleep  showed moderate OSA with an AHi of 28.3/hr and nocturnal hypoxemia with lowest O2 81%.  He underwent CPAP titration to 15cm H2O.    He initially was doing well with his CPAP device but his device stopped working and he took it back to the DME.  He never went and picked it back because he says that they never called him back.  At last OV a new sleep study was ordered showing moderate OSA with an AHI of 23.9/hr and eventually was placed on auto CPAP from 4 to 20cm H2O.   HE is doing well with his PAP device and thinks that he has gotten used to it.  He tolerates the mask and feels the pressure is adequate.  Since going on PAP He feels rested in the am but still has some sleepiness during the day.  He goes to bed at 9-10pm and gets up at 7-8am.   He denies any significant mouth or nasal dryness or nasal congestion.  He does not think that he snores.    Prior CV studies:   The following studies were reviewed today:  Sleep study, CPAP titration  Past Medical History:  Diagnosis Date   HLD (hyperlipidemia)    HTN (hypertension)    Memory difficulty    OSA on CPAP 06/23/2018   Moderate obstructive sleep apnea with an AHI of 28.3/h with oxygen desaturations as low as 81%.  Now on CPAP at 15 cm H2O.   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   simvastatin (ZOCOR) 40 MG tablet Take 40 mg by mouth at bedtime.   tamsulosin (FLOMAX) 0.4 MG CAPS capsule Take 1 capsule (0.4 mg total) by mouth daily.   vitamin B-12 (CYANOCOBALAMIN) 100 MCG tablet TAKE 1 TABLET BY MOUTH EVERY DAY     Allergies:   Aspirin   Social History   Tobacco Use   Smoking status: Former    Current packs/day: 0.00    Types: Cigarettes    Quit date: 02/03/1992    Years since quitting: 30.6   Smokeless tobacco: Never  Vaping Use   Vaping status: Never Used  Substance Use Topics   Alcohol use: No   Drug use: No     Family Hx: The patient's family history includes Coronary artery disease in his unknown relative; Heart attack in his brother, father, and mother; Heart disease in his brother, father, and mother.  ROS:   Please see the history of present illness.  All other systems reviewed and are negative.   Labs/Other Tests and Data Reviewed:    Recent Labs: No results found for requested labs within last 365 days.   Recent Lipid Panel Lab Results  Component Value Date/Time   CHOL 162 10/05/2020 12:21 AM   TRIG 50 10/05/2020 12:21 AM   HDL 65 10/05/2020 12:21 AM   CHOLHDL 2.5 10/05/2020 12:21 AM   LDLCALC 87 10/05/2020 12:21 AM    Wt Readings from Last 3 Encounters:  09/04/22 187 lb 6.4 oz (85 kg)  07/23/22 187 lb 3.2 oz (84.9 kg)  12/10/21 183 lb (83 kg)     Objective:    Vital Signs:  BP 122/62   Pulse 94   Ht 6' (1.829 m)   Wt 187 lb 6.4 oz (85 kg)   SpO2 96%   BMI 25.42 kg/m    GEN: Well nourished, well developed in no acute distress HEENT: Normal NECK: No JVD; No carotid bruits LYMPHATICS: No lymphadenopathy CARDIAC:RRR, no murmurs, rubs, gallops RESPIRATORY:  Clear to auscultation without rales, wheezing or rhonchi   ABDOMEN: Soft, non-tender, non-distended MUSCULOSKELETAL:  No edema; No deformity  SKIN: Warm and dry NEUROLOGIC:  Alert and oriented x 3 PSYCHIATRIC:  Normal affect  ASSESSMENT & PLAN:    1.  OSA - The patient is tolerating PAP therapy well without any problems. The PAP download performed by his DME was personally reviewed and interpreted by me today and showed an AHI of 4.1/hr on auto CPAP at 6-12 cm H2O with 77% compliance in using more than 4 hours nightly.  The patient has been using and benefiting from PAP use and will continue to benefit from therapy.   2.  HTN -BP controlled on exam today -not on any antihypertensive meds    Medication Adjustments/Labs and Tests Ordered: Current medicines are reviewed at length with the patient today.  Concerns regarding medicines are outlined above.  Tests Ordered: Orders Placed This Encounter  Procedures   EKG 12-Lead   Medication Changes: No orders of the defined types were placed in this encounter.   Disposition:  Follow up after sleep study  Signed, Armanda Magic, MD  09/04/2022 1:57 PM    [facility]

## 2022-09-04 NOTE — Patient Instructions (Signed)
Medication Instructions:  Your physician recommends that you continue on your current medications as directed. Please refer to the Current Medication list given to you today.  *If you need a refill on your cardiac medications before your next appointment, please call your pharmacy*   Lab Work: None.  If you have labs (blood work) drawn today and your tests are completely normal, you will receive your results only by: MyChart Message (if you have MyChart) OR A paper copy in the mail If you have any lab test that is abnormal or we need to change your treatment, we will call you to review the results.   Testing/Procedures: None.   Follow-Up: At  [facility], you and your health needs are our priority.  As part of our continuing mission to provide you with exceptional heart care, we have created designated Provider Care Teams.  These Care Teams include your primary Cardiologist (physician) and Advanced Practice Providers (APPs -  Physician Assistants and Nurse Practitioners) who all work together to provide you with the care you need, when you need it.  We recommend signing up for the patient portal called "MyChart".  Sign up information is provided on this After Visit Summary.  MyChart is used to connect with patients for Virtual Visits (Telemedicine).  Patients are able to view lab/test results, encounter notes, upcoming appointments, etc.  Non-urgent messages can be sent to your provider as well.   To learn more about what you can do with MyChart, go to [email or web address].    Your next appointment:   1 year(s)  Provider:   Dr. Traci Turner, MD   

## 2022-09-07 DIAGNOSIS — C61 Malignant neoplasm of prostate: Secondary | ICD-10-CM | POA: Diagnosis not present

## 2022-09-18 DIAGNOSIS — E1151 Type 2 diabetes mellitus with diabetic peripheral angiopathy without gangrene: Secondary | ICD-10-CM | POA: Diagnosis not present

## 2022-09-18 DIAGNOSIS — L603 Nail dystrophy: Secondary | ICD-10-CM | POA: Diagnosis not present

## 2022-09-18 DIAGNOSIS — I739 Peripheral vascular disease, unspecified: Secondary | ICD-10-CM | POA: Diagnosis not present

## 2022-09-18 DIAGNOSIS — E1142 Type 2 diabetes mellitus with diabetic polyneuropathy: Secondary | ICD-10-CM | POA: Diagnosis not present

## 2022-10-05 DIAGNOSIS — G4733 Obstructive sleep apnea (adult) (pediatric): Secondary | ICD-10-CM | POA: Diagnosis not present

## 2022-10-12 DIAGNOSIS — R3914 Feeling of incomplete bladder emptying: Secondary | ICD-10-CM | POA: Diagnosis not present

## 2022-10-12 DIAGNOSIS — R972 Elevated prostate specific antigen [PSA]: Secondary | ICD-10-CM | POA: Diagnosis not present

## 2022-10-12 DIAGNOSIS — N401 Enlarged prostate with lower urinary tract symptoms: Secondary | ICD-10-CM | POA: Diagnosis not present

## 2022-11-04 DIAGNOSIS — G4733 Obstructive sleep apnea (adult) (pediatric): Secondary | ICD-10-CM | POA: Diagnosis not present

## 2022-11-12 DIAGNOSIS — G4733 Obstructive sleep apnea (adult) (pediatric): Secondary | ICD-10-CM | POA: Diagnosis not present

## 2022-11-16 ENCOUNTER — Other Ambulatory Visit (HOSPITAL_BASED_OUTPATIENT_CLINIC_OR_DEPARTMENT_OTHER): Payer: Self-pay

## 2022-11-16 MED ORDER — COVID-19 MRNA VAC-TRIS(PFIZER) 30 MCG/0.3ML IM SUSY
0.3000 mL | PREFILLED_SYRINGE | Freq: Once | INTRAMUSCULAR | 0 refills | Status: AC
  Filled 2022-11-16: qty 0.3, 1d supply, fill #0

## 2022-11-16 MED ORDER — INFLUENZA VAC A&B SURF ANT ADJ 0.5 ML IM SUSY
0.5000 mL | PREFILLED_SYRINGE | Freq: Once | INTRAMUSCULAR | 0 refills | Status: AC
  Filled 2022-11-16: qty 0.5, 1d supply, fill #0

## 2022-12-05 DIAGNOSIS — G4733 Obstructive sleep apnea (adult) (pediatric): Secondary | ICD-10-CM | POA: Diagnosis not present

## 2022-12-18 DIAGNOSIS — L603 Nail dystrophy: Secondary | ICD-10-CM | POA: Diagnosis not present

## 2022-12-18 DIAGNOSIS — I739 Peripheral vascular disease, unspecified: Secondary | ICD-10-CM | POA: Diagnosis not present

## 2022-12-18 DIAGNOSIS — E1151 Type 2 diabetes mellitus with diabetic peripheral angiopathy without gangrene: Secondary | ICD-10-CM | POA: Diagnosis not present

## 2022-12-18 DIAGNOSIS — E1142 Type 2 diabetes mellitus with diabetic polyneuropathy: Secondary | ICD-10-CM | POA: Diagnosis not present

## 2023-01-04 DIAGNOSIS — G4733 Obstructive sleep apnea (adult) (pediatric): Secondary | ICD-10-CM | POA: Diagnosis not present

## 2023-01-14 DIAGNOSIS — E119 Type 2 diabetes mellitus without complications: Secondary | ICD-10-CM | POA: Diagnosis not present

## 2023-02-04 DIAGNOSIS — G4733 Obstructive sleep apnea (adult) (pediatric): Secondary | ICD-10-CM | POA: Diagnosis not present

## 2023-03-08 DIAGNOSIS — M19071 Primary osteoarthritis, right ankle and foot: Secondary | ICD-10-CM | POA: Diagnosis not present

## 2023-03-08 DIAGNOSIS — M12279 Villonodular synovitis (pigmented), unspecified ankle and foot: Secondary | ICD-10-CM | POA: Diagnosis not present

## 2023-03-08 DIAGNOSIS — M65871 Other synovitis and tenosynovitis, right ankle and foot: Secondary | ICD-10-CM | POA: Diagnosis not present

## 2023-03-08 DIAGNOSIS — M25571 Pain in right ankle and joints of right foot: Secondary | ICD-10-CM | POA: Diagnosis not present

## 2023-03-15 DIAGNOSIS — M25571 Pain in right ankle and joints of right foot: Secondary | ICD-10-CM | POA: Diagnosis not present

## 2023-03-19 DIAGNOSIS — L603 Nail dystrophy: Secondary | ICD-10-CM | POA: Diagnosis not present

## 2023-03-19 DIAGNOSIS — I739 Peripheral vascular disease, unspecified: Secondary | ICD-10-CM | POA: Diagnosis not present

## 2023-03-19 DIAGNOSIS — E1142 Type 2 diabetes mellitus with diabetic polyneuropathy: Secondary | ICD-10-CM | POA: Diagnosis not present

## 2023-03-19 DIAGNOSIS — E1151 Type 2 diabetes mellitus with diabetic peripheral angiopathy without gangrene: Secondary | ICD-10-CM | POA: Diagnosis not present

## 2023-05-13 ENCOUNTER — Other Ambulatory Visit (HOSPITAL_COMMUNITY): Payer: Self-pay

## 2023-06-18 ENCOUNTER — Ambulatory Visit: Admitting: Podiatry

## 2023-07-07 ENCOUNTER — Ambulatory Visit: Admitting: Podiatry

## 2023-07-19 DIAGNOSIS — E785 Hyperlipidemia, unspecified: Secondary | ICD-10-CM | POA: Diagnosis not present

## 2023-07-19 DIAGNOSIS — Z125 Encounter for screening for malignant neoplasm of prostate: Secondary | ICD-10-CM | POA: Diagnosis not present

## 2023-07-19 DIAGNOSIS — I2510 Atherosclerotic heart disease of native coronary artery without angina pectoris: Secondary | ICD-10-CM | POA: Diagnosis not present

## 2023-07-19 DIAGNOSIS — D509 Iron deficiency anemia, unspecified: Secondary | ICD-10-CM | POA: Diagnosis not present

## 2023-07-19 DIAGNOSIS — E1151 Type 2 diabetes mellitus with diabetic peripheral angiopathy without gangrene: Secondary | ICD-10-CM | POA: Diagnosis not present

## 2023-07-19 DIAGNOSIS — Z8546 Personal history of malignant neoplasm of prostate: Secondary | ICD-10-CM | POA: Diagnosis not present

## 2023-07-19 DIAGNOSIS — I10 Essential (primary) hypertension: Secondary | ICD-10-CM | POA: Diagnosis not present

## 2023-07-22 DIAGNOSIS — E785 Hyperlipidemia, unspecified: Secondary | ICD-10-CM | POA: Diagnosis not present

## 2023-07-22 DIAGNOSIS — I739 Peripheral vascular disease, unspecified: Secondary | ICD-10-CM | POA: Diagnosis not present

## 2023-07-22 DIAGNOSIS — I2510 Atherosclerotic heart disease of native coronary artery without angina pectoris: Secondary | ICD-10-CM | POA: Diagnosis not present

## 2023-07-22 DIAGNOSIS — I700 Atherosclerosis of aorta: Secondary | ICD-10-CM | POA: Diagnosis not present

## 2023-07-22 DIAGNOSIS — Z1339 Encounter for screening examination for other mental health and behavioral disorders: Secondary | ICD-10-CM | POA: Diagnosis not present

## 2023-07-22 DIAGNOSIS — E1151 Type 2 diabetes mellitus with diabetic peripheral angiopathy without gangrene: Secondary | ICD-10-CM | POA: Diagnosis not present

## 2023-07-22 DIAGNOSIS — I10 Essential (primary) hypertension: Secondary | ICD-10-CM | POA: Diagnosis not present

## 2023-07-22 DIAGNOSIS — Z8673 Personal history of transient ischemic attack (TIA), and cerebral infarction without residual deficits: Secondary | ICD-10-CM | POA: Diagnosis not present

## 2023-07-22 DIAGNOSIS — G4733 Obstructive sleep apnea (adult) (pediatric): Secondary | ICD-10-CM | POA: Diagnosis not present

## 2023-07-22 DIAGNOSIS — Z0000 Encounter for general adult medical examination without abnormal findings: Secondary | ICD-10-CM | POA: Diagnosis not present

## 2023-07-22 DIAGNOSIS — R82998 Other abnormal findings in urine: Secondary | ICD-10-CM | POA: Diagnosis not present

## 2023-07-22 DIAGNOSIS — Z8546 Personal history of malignant neoplasm of prostate: Secondary | ICD-10-CM | POA: Diagnosis not present

## 2023-07-22 DIAGNOSIS — R413 Other amnesia: Secondary | ICD-10-CM | POA: Diagnosis not present

## 2023-07-22 DIAGNOSIS — Z1331 Encounter for screening for depression: Secondary | ICD-10-CM | POA: Diagnosis not present

## 2023-08-09 DIAGNOSIS — C61 Malignant neoplasm of prostate: Secondary | ICD-10-CM | POA: Diagnosis not present

## 2023-08-09 DIAGNOSIS — R972 Elevated prostate specific antigen [PSA]: Secondary | ICD-10-CM | POA: Diagnosis not present

## 2023-08-16 ENCOUNTER — Other Ambulatory Visit (HOSPITAL_COMMUNITY): Payer: Self-pay | Admitting: Urology

## 2023-08-16 DIAGNOSIS — C61 Malignant neoplasm of prostate: Secondary | ICD-10-CM

## 2023-08-27 ENCOUNTER — Encounter (HOSPITAL_COMMUNITY)
Admission: RE | Admit: 2023-08-27 | Discharge: 2023-08-27 | Disposition: A | Discharge status: Home / Self Care | Source: Ambulatory Visit | Attending: Urology | Admitting: Urology

## 2023-08-27 DIAGNOSIS — C61 Malignant neoplasm of prostate: Secondary | ICD-10-CM | POA: Diagnosis not present

## 2023-08-27 MED ORDER — FLOTUFOLASTAT F 18 GALLIUM 296-5846 MBQ/ML IV SOLN
8.2000 | Freq: Once | INTRAVENOUS | Status: AC
  Administered 2023-08-27: 8.2 via INTRAVENOUS

## 2023-09-02 DIAGNOSIS — H6123 Impacted cerumen, bilateral: Secondary | ICD-10-CM | POA: Diagnosis not present

## 2023-09-02 DIAGNOSIS — H903 Sensorineural hearing loss, bilateral: Secondary | ICD-10-CM | POA: Diagnosis not present

## 2023-09-09 DIAGNOSIS — R972 Elevated prostate specific antigen [PSA]: Secondary | ICD-10-CM | POA: Diagnosis not present

## 2023-09-09 DIAGNOSIS — C61 Malignant neoplasm of prostate: Secondary | ICD-10-CM | POA: Diagnosis not present

## 2023-09-28 ENCOUNTER — Ambulatory Visit (INDEPENDENT_AMBULATORY_CARE_PROVIDER_SITE_OTHER): Admitting: Podiatry

## 2023-09-28 ENCOUNTER — Encounter: Payer: Self-pay | Admitting: Podiatry

## 2023-09-28 VITALS — Ht 72.0 in | Wt 187.0 lb

## 2023-09-28 DIAGNOSIS — M79672 Pain in left foot: Secondary | ICD-10-CM

## 2023-09-28 DIAGNOSIS — M79671 Pain in right foot: Secondary | ICD-10-CM

## 2023-09-28 DIAGNOSIS — B351 Tinea unguium: Secondary | ICD-10-CM | POA: Diagnosis not present

## 2023-09-28 NOTE — Progress Notes (Signed)
 Patient presents for evaluation and treatment of tenderness and some redness around nails feet.  Tenderness around toes with walking and wearing shoes.  Physical exam:  General appearance: Alert, pleasant, and in no acute distress.  Vascular: Pedal pulses: DP 2/4 B/L, PT 0/4 B/L. Moderate edema lower legs bilaterally  Neurological:    Dermatologic:  Nails thickened, disfigured, discolored 1-5 BL with subungual debris, except hallux right.  Redness and hypertrophic nail folds along nail folds bilaterally but no signs of drainage or infection.  Musculoskeletal:     Diagnosis: 1. Painful onychomycotic nails 1 through 5 bilaterally, except hallux right 2. Pain toes 1 through 5 bilaterally.  Plan: Debrided onychomycotic nails 1 through 5 bilaterally, except for hallux right.  Sharply debrided nails with nail nippers and reduced with a power bur  Return 3 months RFC

## 2023-11-24 DIAGNOSIS — H6123 Impacted cerumen, bilateral: Secondary | ICD-10-CM | POA: Diagnosis not present

## 2023-12-29 ENCOUNTER — Ambulatory Visit (INDEPENDENT_AMBULATORY_CARE_PROVIDER_SITE_OTHER): Admitting: Podiatry

## 2023-12-29 DIAGNOSIS — B351 Tinea unguium: Secondary | ICD-10-CM

## 2023-12-29 DIAGNOSIS — M79672 Pain in left foot: Secondary | ICD-10-CM | POA: Diagnosis not present

## 2023-12-29 DIAGNOSIS — M79671 Pain in right foot: Secondary | ICD-10-CM | POA: Diagnosis not present

## 2023-12-29 NOTE — Progress Notes (Signed)
 Patient presents for evaluation and treatment of tenderness and some redness around nails feet.  Tenderness around toes with walking and wearing shoes.  Physical exam:  General appearance: Alert, pleasant, and in no acute distress.  Vascular: Pedal pulses: DP 2/4 B/L, PT 0/4 B/L.  Moderate edema lower legs bilaterally.  Capil2lary refill time immediate bilaterally  Neurologic:  Dermatologic:  Nails thickened, disfigured, discolored 1-5 BL with subungual debris.  Redness and hypertrophic nail folds along nail folds bilaterally but no signs of drainage or infection.  Musculoskeletal:     Diagnosis: 1. Painful onychomycotic nails 1 through 5 bilaterally. 2. Pain toes 1 through 5 bilaterally.  Plan: -Debrided onychomycotic nails 1 through 5 bilaterally.  Sharply debrided nails with nail clipper and reduced with a power bur.  Return 3 months RFC

## 2023-12-31 ENCOUNTER — Emergency Department (HOSPITAL_COMMUNITY)

## 2023-12-31 ENCOUNTER — Other Ambulatory Visit: Payer: Self-pay

## 2023-12-31 ENCOUNTER — Ambulatory Visit (HOSPITAL_COMMUNITY)
Admission: EM | Admit: 2023-12-31 | Discharge: 2023-12-31 | Disposition: A | Discharge status: Home / Self Care | Attending: Physician Assistant | Admitting: Physician Assistant

## 2023-12-31 ENCOUNTER — Encounter (HOSPITAL_COMMUNITY): Payer: Self-pay | Admitting: *Deleted

## 2023-12-31 ENCOUNTER — Emergency Department (HOSPITAL_COMMUNITY)
Admission: EM | Admit: 2023-12-31 | Discharge: 2023-12-31 | Disposition: A | Discharge status: Home / Self Care | Attending: Emergency Medicine | Admitting: Emergency Medicine

## 2023-12-31 DIAGNOSIS — R4182 Altered mental status, unspecified: Secondary | ICD-10-CM | POA: Diagnosis not present

## 2023-12-31 DIAGNOSIS — S0003XA Contusion of scalp, initial encounter: Secondary | ICD-10-CM | POA: Diagnosis not present

## 2023-12-31 DIAGNOSIS — M542 Cervicalgia: Secondary | ICD-10-CM | POA: Diagnosis not present

## 2023-12-31 DIAGNOSIS — Z8673 Personal history of transient ischemic attack (TIA), and cerebral infarction without residual deficits: Secondary | ICD-10-CM | POA: Diagnosis not present

## 2023-12-31 DIAGNOSIS — W19XXXA Unspecified fall, initial encounter: Secondary | ICD-10-CM | POA: Diagnosis not present

## 2023-12-31 DIAGNOSIS — D649 Anemia, unspecified: Secondary | ICD-10-CM | POA: Insufficient documentation

## 2023-12-31 DIAGNOSIS — S0990XA Unspecified injury of head, initial encounter: Secondary | ICD-10-CM

## 2023-12-31 DIAGNOSIS — I10 Essential (primary) hypertension: Secondary | ICD-10-CM | POA: Diagnosis not present

## 2023-12-31 DIAGNOSIS — Z8546 Personal history of malignant neoplasm of prostate: Secondary | ICD-10-CM | POA: Insufficient documentation

## 2023-12-31 DIAGNOSIS — M25561 Pain in right knee: Secondary | ICD-10-CM | POA: Insufficient documentation

## 2023-12-31 DIAGNOSIS — R519 Headache, unspecified: Secondary | ICD-10-CM

## 2023-12-31 LAB — URINALYSIS, ROUTINE W REFLEX MICROSCOPIC
Bacteria, UA: NONE SEEN
Bilirubin Urine: NEGATIVE
Glucose, UA: NEGATIVE mg/dL
Ketones, ur: 5 mg/dL — AB
Leukocytes,Ua: NEGATIVE
Nitrite: NEGATIVE
Protein, ur: 100 mg/dL — AB
Specific Gravity, Urine: 1.018 (ref 1.005–1.030)
pH: 6 (ref 5.0–8.0)

## 2023-12-31 LAB — BASIC METABOLIC PANEL WITH GFR
Anion gap: 15 (ref 5–15)
BUN: 7 mg/dL — ABNORMAL LOW (ref 8–23)
CO2: 22 mmol/L (ref 22–32)
Calcium: 8.9 mg/dL (ref 8.9–10.3)
Chloride: 103 mmol/L (ref 98–111)
Creatinine, Ser: 0.97 mg/dL (ref 0.61–1.24)
GFR, Estimated: >60 mL/min (ref >60)
Glucose, Bld: 125 mg/dL — ABNORMAL HIGH (ref 70–99)
Potassium: 3.6 mmol/L (ref 3.5–5.1)
Sodium: 140 mmol/L (ref 135–145)

## 2023-12-31 LAB — CBC
HCT: 35.1 % — ABNORMAL LOW (ref 39.0–52.0)
Hemoglobin: 11.8 g/dL — ABNORMAL LOW (ref 13.0–17.0)
MCH: 26.8 pg (ref 26.0–34.0)
MCHC: 33.6 g/dL (ref 30.0–36.0)
MCV: 79.6 fL — ABNORMAL LOW (ref 80.0–100.0)
Platelets: 187 K/uL (ref 150–400)
RBC: 4.41 MIL/uL (ref 4.22–5.81)
RDW: 18.5 % — ABNORMAL HIGH (ref 11.5–15.5)
WBC: 8.3 K/uL (ref 4.0–10.5)
nRBC: 0 % (ref 0.0–0.2)

## 2023-12-31 LAB — POCT CBG MONITORING

## 2023-12-31 NOTE — ED Triage Notes (Signed)
 Patient had mechanical fall on Monday. Hit his head, no LOC. Sister states fall was d/t his not picking his feet up when he walks. Complains of headache, neck pain, and R knee pain.

## 2023-12-31 NOTE — ED Provider Notes (Signed)
 [place] EMERGENCY DEPARTMENT AT [facility] Provider Note   CSN: [id number] Arrival date & time: 12/31/23  1024     Patient presents with: Ricky [name]. is [name] 88 y.o. male.   Patient with history of hyperlipidemia, OSA on CPAP, stroke presents today with complaints of fall, altered mental status. Reports that he sustained [name] mechanical fall on Monday (4 days ago), hematoma noted to the frontal region from this. Apparently did not report this fall to anyone, was able to get up off the floor and continue on as usual. He is not anticoagulated. Lives alone, sister checks on him daily.  This morning, the sister came for [name] visit and noticed that he was out of it, was complaining of [name] headache and mentioned the fall to her which prompted her to bring him in for evaluation. Originally went to urgent care, was redirected here for evaluation. Patient reports that he has [name] headache as well as right knee pain. No vision changes, nausea, or vomiting. Denies any other injuries or complaints at this time.  Sister at bedside reports he is back to his baseline at this time.   The history is provided by the patient. No language interpreter was used.  Fall Associated symptoms include headaches.       Prior to Admission medications   Medication Sig Start Date End Date Taking? Authorizing Provider  metFORMIN (GLUCOPHAGE) 500 MG tablet Take 500 mg by mouth 2 (two) times daily with [name] meal.    [provider]  simvastatin  (ZOCOR ) 40 MG tablet Take 40 mg by mouth at bedtime.    [provider]  tamsulosin  (FLOMAX ) 0.4 MG CAPS capsule Take 1 capsule (0.4 mg total) by mouth daily. 10/11/20   Ganta, Anupa, DO  vitamin B-12 (CYANOCOBALAMIN ) 100 MCG tablet TAKE 1 TABLET BY MOUTH EVERY DAY 11/05/20   Nichole Senior, MD    Allergies: Aspirin    Review of Systems  Neurological:  Positive for headaches.  All other systems reviewed and are negative.   Updated Vital Signs BP  (!) 187/92 (BP Location: Left Arm)   Pulse 81   Temp 97.7 F (36.5 C)   Resp 18   SpO2 98%   Physical Exam Vitals and nursing note reviewed.  Constitutional:      General: He is not in acute distress.    Appearance: Normal appearance. He is normal weight. He is not ill-appearing, toxic-appearing or diaphoretic.  HENT:     Head: Normocephalic and atraumatic.     Comments: No racoon eyes No battle sign  Small frontal scalp hematoma noted, no crepitus or deformity. Eyes:     Extraocular Movements: Extraocular movements intact.     Pupils: Pupils are equal, round, and reactive to light.  Cardiovascular:     Rate and Rhythm: Normal rate.     Comments: No tenderness to palpation of the anterior chest wall Pulmonary:     Effort: Pulmonary effort is normal. No respiratory distress.  Abdominal:     Comments: No abdominal tenderness or bruising  Musculoskeletal:        General: Normal range of motion.     Cervical back: Normal and normal range of motion.     Thoracic back: Normal.     Lumbar back: Normal.     Comments: No midline tenderness, no stepoffs or deformity noted on palpation of cervical, thoracic, and lumbar spine  Subjective tenderness to palpation throughout the right knee, no swelling, bruising,  or deformity. Good distal pulses and sensation. Ambulatory  Skin:    General: Skin is warm and dry.  Neurological:     General: No focal deficit present.     Mental Status: He is alert and oriented to person, place, and time.     GCS: GCS eye subscore is 4. GCS verbal subscore is 5. GCS motor subscore is 6.     Sensory: Sensation is intact.     Motor: Motor function is intact.     Coordination: Coordination is intact.     Gait: Gait is intact.     Comments: Alert and oriented to self, place, time and event.    Speech is fluent, clear without dysarthria or dysphasia.    Strength 5/5 in upper/lower extremities   Sensation intact in upper/lower extremities    CN I not  tested  CN II grossly intact visual fields bilaterally. Did not visualize posterior eye.  CN III, IV, VI PERRLA and EOMs intact bilaterally  CN V Intact sensation to sharp and light touch to the face  CN VII facial movements symmetric  CN VIII not tested  CN IX, X no uvula deviation, symmetric rise of soft palate  CN XI 5/5 SCM and trapezius strength bilaterally  CN XII Midline tongue protrusion, symmetric L/R movements   Psychiatric:        Mood and Affect: Mood normal.        Behavior: Behavior normal.     (all labs ordered are listed, but only abnormal results are displayed) Labs Reviewed  CBC - Abnormal; Notable for the following components:      Result Value   Hemoglobin 11.8 (*)    HCT 35.1 (*)    MCV 79.6 (*)    RDW 18.5 (*)    All other components within normal limits  BASIC METABOLIC PANEL WITH GFR - Abnormal; Notable for the following components:   Glucose, Bld 125 (*)    BUN 7 (*)    All other components within normal limits  URINALYSIS, ROUTINE W REFLEX MICROSCOPIC    EKG: EKG Interpretation Date/Time:  Friday December 31 2023 11:26:26 EST Ventricular Rate:  70 PR Interval:  212 QRS Duration:  72 QT Interval:  382 QTC Calculation: 412 R Axis:   -21  Text Interpretation: Sinus rhythm with sinus arrhythmia with 1st degree [name]-V block Possible Anterior infarct , age undetermined Abnormal ECG No significant change since last tracing Confirmed by Ellouise Fine ([id number]) on 12/31/2023 11:35:36 AM  Radiology: No results found.   Procedures   Medications Ordered in the ED - No data to display                                  Medical Decision Making Amount and/or Complexity of Data Reviewed Labs: ordered. Radiology: ordered.   This patient is [name] 88 y.o. male who presents to the ED for concern of fall, altered mental status this involves an extensive number of treatment options, and is [name] complaint that carries with it [name] high risk of complications and  morbidity. The emergent differential diagnosis prior to evaluation includes, but is not limited to,  Drug-related, hypoxia, hyper/hypoglycemia, encephalopathy, sepsis, DKA/HHS, brain lesion, CVA, seizure, environmental, psychiatric  This is not an exhaustive differential.   Past Medical History / Co-morbidities / Social History:  has [name] past medical history of HLD (hyperlipidemia), HTN (hypertension), Memory difficulty, OSA on CPAP (06/23/2018), Prostate  cancer [facility]), PVD (peripheral vascular disease), and Stroke (cerebrum) ([facility]).  Additional history: Chart reviewed.  Physical Exam: Physical exam performed. The pertinent findings include: Patient is alert and oriented and neurologically intact without focal deficits. Small frontal scalp hematoma noted, no crepitus or deformity.  No midline tenderness, no stepoffs or deformity noted on palpation of cervical, thoracic, and lumbar spine  Subjective tenderness to palpation throughout the right knee, no swelling, bruising, or deformity. Good distal pulses and sensation. Ambulatory  Lab Tests: I ordered, and personally interpreted labs.  The pertinent results include:  hgb 11.8 (consistent with previous), UA with hgb, ketones, protein, noninfectious   Imaging Studies: I ordered imaging studies including CT head, cervical spine, DG right knee. I independently visualized and interpreted imaging which showed   CT head:  1. No acute intracranial abnormality related to the head trauma. 2. Left frontal scalp hematoma.  CT cervical spine: 1. No acute abnormality of the cervical spine related to the provided clinical history of neck trauma. 2. Mild reversal of the normal cervical lordosis. 3. Fusion of the C4-5, C5-6, and C6-7 disc spaces. 4. Moderate left neural foraminal stenosis at C3-4 secondary to uncovertebral joint hypertrophy and facet arthrosis.   I agree with the radiologist interpretation.   Cardiac Monitoring:  The patient was  maintained on [name] cardiac monitor.  My attending physician Dr. Ellouise viewed and interpreted the cardiac monitored which showed an underlying rhythm of: sinus rhythm, no STEMI. I agree with this interpretation.   Medications: Offered pain medication which patient declined  Disposition: After consideration of the diagnostic results and the patients response to treatment, I feel that emergency department workup does not suggest an emergent condition requiring admission or immediate intervention beyond what has been performed at this time. The plan is: Discharge with close outpatient follow-up and return precautions.  Patient's workup is benign and he has at his baseline mental status and without significant plaints complaints and ready to go home.  He is ambulatory. Evaluation and diagnostic testing in the emergency department does not suggest an emergent condition requiring admission or immediate intervention beyond what has been performed at this time.  Plan for discharge with close PCP follow-up.  Patient is understanding and amenable with plan, educated on red flag symptoms that would prompt immediate return.  Patient discharged in stable condition.   I discussed this case with my attending physician Dr. Ellouise who cosigned this note including patient's presenting symptoms, physical exam, and planned diagnostics and interventions. Attending physician stated agreement with plan or made changes to plan which were implemented.    Final diagnoses:  Fall, initial encounter    ED Discharge Orders     None     An After Visit Summary was printed and given to the patient.      Ricky Montminy A, PA-C 12/31/23 1440    Kingsley, Victoria K, [place] 12/31/23 1527

## 2023-12-31 NOTE — Discharge Instructions (Signed)
 As we discussed, your workup in the ER today was reassuring for acute findings.  Laboratory evaluation and CT imaging did not reveal any emergent cause of your symptoms.  Given that you are feeling well, no further evaluation is indicated today.  Please be sure you are maintaining adequate oral hydration and follow-up closely with your primary care provider at your earliest convenience.  Return if development of any new or worsening symptoms.

## 2023-12-31 NOTE — ED Provider Notes (Signed)
 I was called into triage to evaluate patient by nursing staff.  Patient is accompanied by sister who help provide the majority of history.  Reports that he fell on Monday (12/27/2023) and has a frontal hematoma.  He does not take any blood thinning medication.  He reports ongoing headache that is currently rated 6/7 on a 0-10 pain scale, described as pressure, no alleviating factors identified.  Denies any nausea or vomiting.  He lives alone but sister reports that she has been visiting him daily and the only recent mentioned the fall today.  He has not had any nausea or vomiting.  She does report that he did not eat his breakfast this morning and was slower to respond which is abnormal for him.  He did not have any gross neurological defect on exam.  His blood sugar was appropriate.  We discussed that given his age, fall, headache, elevated blood pressure the same thing to do would be to go to the emergency room.  Patient and caregiver are agreeable and will take him directly across our parking lot to the [facility], ER for further evaluation and management given her limited capabilities in urgent care.  He was stable at the time of discharge.   Sherrell Rocky POUR, PA-C 12/31/23 [id number]

## 2023-12-31 NOTE — ED Triage Notes (Signed)
 PT's sister reports she was told by PT this AM he feel on Monday. Pt has a raised area on forehead a he has RT knee pain. Sister reports pt is foggy this morning.

## 2023-12-31 NOTE — Discharge Instructions (Addendum)
 Please go directly to the emergency room for further evaluation and management.

## 2024-03-31 ENCOUNTER — Ambulatory Visit: Admitting: Podiatry
